# Patient Record
Sex: Male | Born: 1940 | Race: White | Hispanic: No | Marital: Married | Smoking: Former smoker
Health system: Southern US, Community
[De-identification: ages and names within clinical notes are randomized; demographics above are authoritative.]

## PROBLEM LIST (undated history)

## (undated) DIAGNOSIS — I447 Left bundle-branch block, unspecified: Secondary | ICD-10-CM

## (undated) DIAGNOSIS — I10 Essential (primary) hypertension: Secondary | ICD-10-CM

## (undated) DIAGNOSIS — I428 Other cardiomyopathies: Secondary | ICD-10-CM

## (undated) DIAGNOSIS — Z952 Presence of prosthetic heart valve: Secondary | ICD-10-CM

## (undated) DIAGNOSIS — J449 Chronic obstructive pulmonary disease, unspecified: Secondary | ICD-10-CM

## (undated) DIAGNOSIS — D62 Acute posthemorrhagic anemia: Secondary | ICD-10-CM

## (undated) DIAGNOSIS — I2510 Atherosclerotic heart disease of native coronary artery without angina pectoris: Secondary | ICD-10-CM

## (undated) DIAGNOSIS — I509 Heart failure, unspecified: Secondary | ICD-10-CM

## (undated) HISTORY — PX: CARDIAC VALVE REPLACEMENT: SHX585

## (undated) HISTORY — PX: AORTIC VALVE REPLACEMENT: SHX41

## (undated) HISTORY — DX: Atherosclerotic heart disease of native coronary artery without angina pectoris: I25.10

## (undated) HISTORY — DX: Other cardiomyopathies: I42.8

## (undated) HISTORY — DX: Left bundle-branch block, unspecified: I44.7

## (undated) HISTORY — PX: CORONARY ARTERY BYPASS GRAFT: SHX141

## (undated) HISTORY — DX: Presence of prosthetic heart valve: Z95.2

---

## 2005-06-23 ENCOUNTER — Observation Stay (HOSPITAL_COMMUNITY): Admission: AD | Admit: 2005-06-23 | Discharge: 2005-06-24 | Payer: Self-pay | Admitting: Cardiology

## 2005-06-25 ENCOUNTER — Ambulatory Visit: Payer: Self-pay | Admitting: Internal Medicine

## 2005-06-28 ENCOUNTER — Inpatient Hospital Stay (HOSPITAL_COMMUNITY): Admission: RE | Admit: 2005-06-28 | Discharge: 2005-07-06 | Payer: Self-pay | Admitting: *Deleted

## 2006-09-29 ENCOUNTER — Inpatient Hospital Stay: Payer: Self-pay | Admitting: General Surgery

## 2006-10-03 ENCOUNTER — Emergency Department: Payer: Self-pay | Admitting: Unknown Physician Specialty

## 2008-01-19 ENCOUNTER — Encounter: Payer: Self-pay | Admitting: Internal Medicine

## 2008-02-01 ENCOUNTER — Inpatient Hospital Stay (HOSPITAL_COMMUNITY): Admission: AD | Admit: 2008-02-01 | Discharge: 2008-02-04 | Payer: Self-pay | Admitting: Cardiology

## 2008-02-02 ENCOUNTER — Encounter: Payer: Self-pay | Admitting: Cardiovascular Disease

## 2008-02-05 ENCOUNTER — Ambulatory Visit: Payer: Self-pay | Admitting: Surgery

## 2008-02-07 ENCOUNTER — Ambulatory Visit (HOSPITAL_COMMUNITY): Admission: RE | Admit: 2008-02-07 | Discharge: 2008-02-07 | Payer: Self-pay | Admitting: Surgery

## 2008-02-07 ENCOUNTER — Encounter: Payer: Self-pay | Admitting: Surgery

## 2008-02-08 ENCOUNTER — Inpatient Hospital Stay (HOSPITAL_COMMUNITY): Admission: RE | Admit: 2008-02-08 | Discharge: 2008-02-13 | Payer: Self-pay | Admitting: Surgery

## 2008-02-08 ENCOUNTER — Encounter: Payer: Self-pay | Admitting: Surgery

## 2008-02-10 ENCOUNTER — Encounter: Payer: Self-pay | Admitting: Internal Medicine

## 2008-03-05 ENCOUNTER — Encounter: Admission: RE | Admit: 2008-03-05 | Discharge: 2008-03-05 | Payer: Self-pay | Admitting: Surgery

## 2008-03-05 ENCOUNTER — Ambulatory Visit: Payer: Self-pay | Admitting: Surgery

## 2008-06-07 ENCOUNTER — Ambulatory Visit: Payer: Self-pay | Admitting: Surgery

## 2009-06-28 IMAGING — CT CT ANGIO CHEST
2 of 6 series · 19 of 46 positions shown · IV contrast (APPLIED)
Comparison: None

CLINICAL DATA: Aortic insufficiency.  Evaluate ascending aortic
aneurysm.

CT ANGIOGRAPHY CHEST, CT chest without infusion.
TECHNIQUE: Multidetector CT imaging of the chest using the
standard protocol during bolus administration of intravenous
contrast. Multiplanar reconstructed images including MIPs were
obtained and reviewed to evaluate the vascular anatomy. Precontrast
CT scan of the chest was performed as well.
Contrast: 100 ml [id number].

[Series 5: dissection 2.0 st · axial · 0.80mm/px · z∈[-328,-40]mm · 16 of 160 slices shown]
[im 8/160  lung]
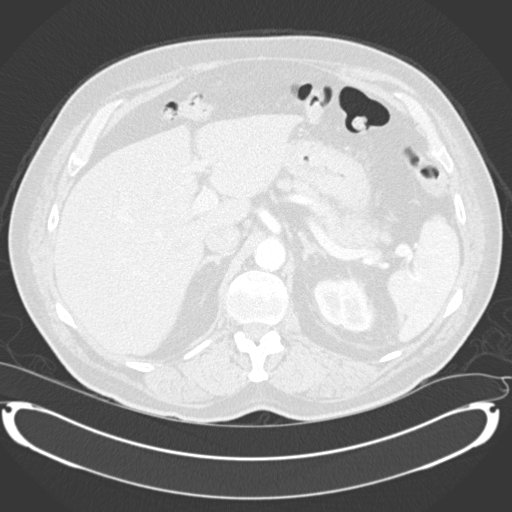
[im 15/160  soft-tissue]
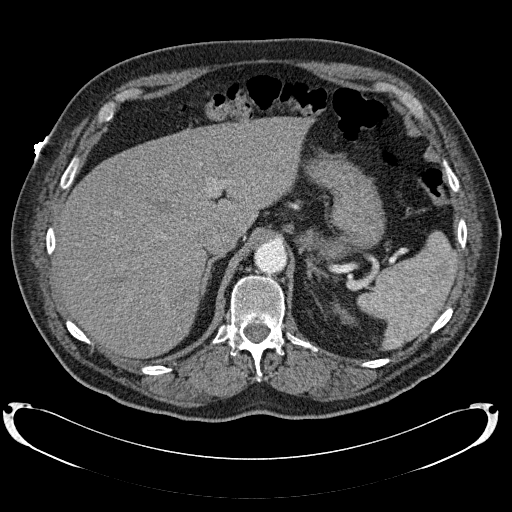
[im 29/160  lung]
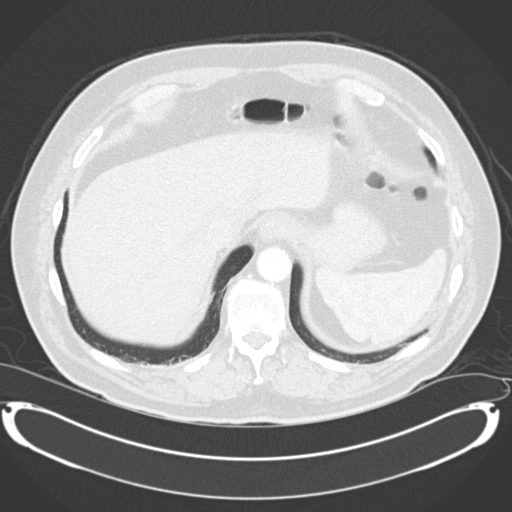
[im 37/160  soft-tissue]
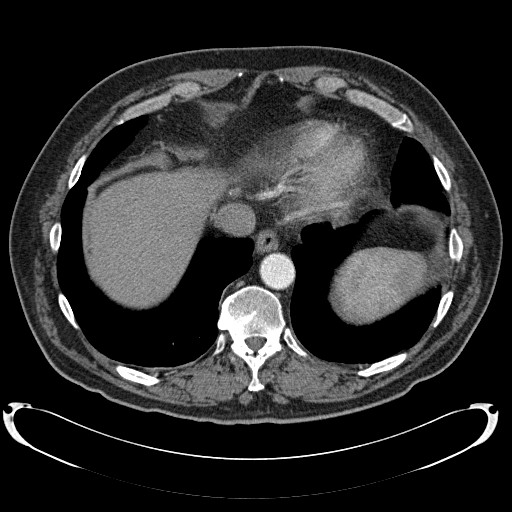
[im 44/160  lung]
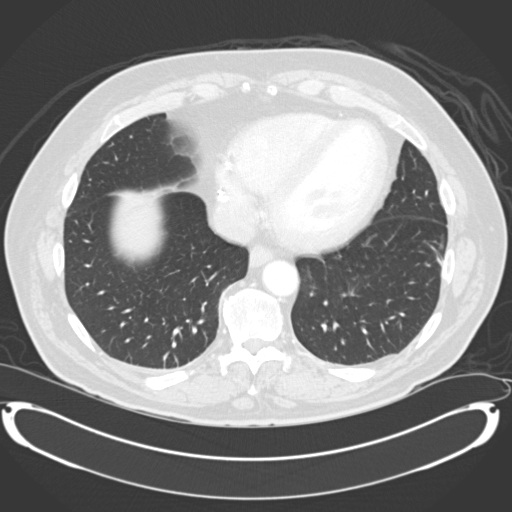
[im 58/160  soft-tissue]
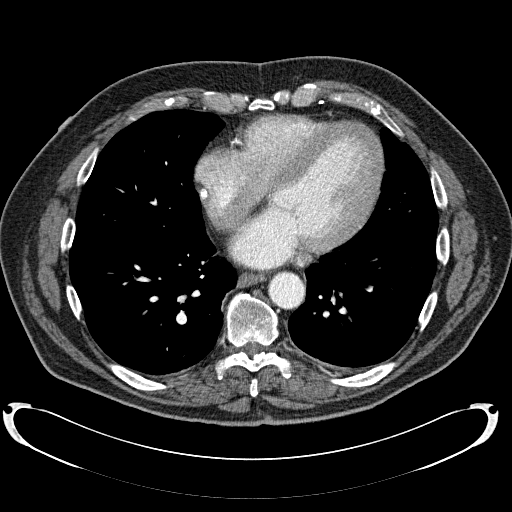
[im 66/160  lung]
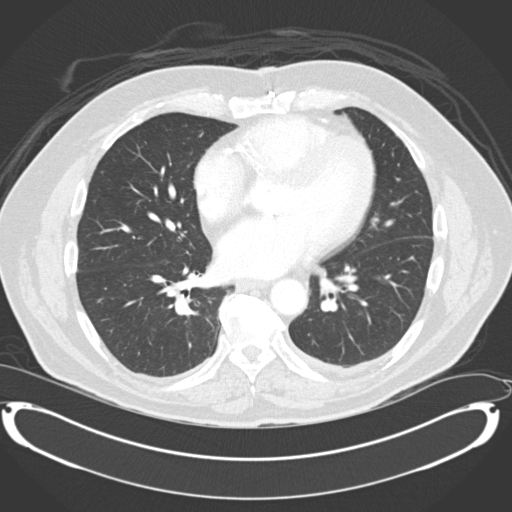
[im 73/160  soft-tissue]
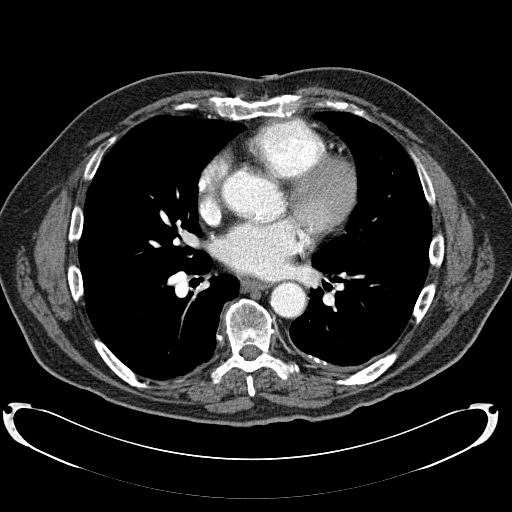
[im 87/160  lung]
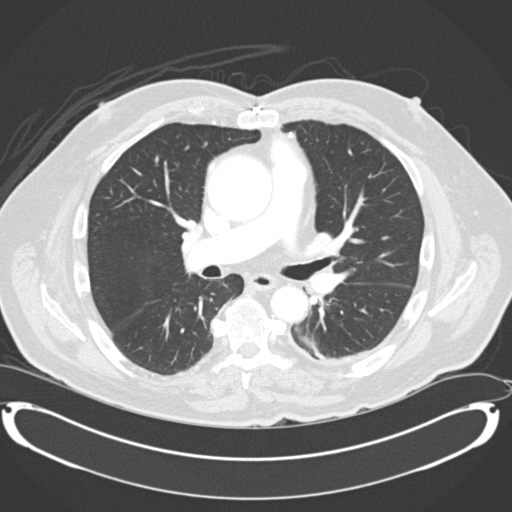
[im 94/160  soft-tissue]
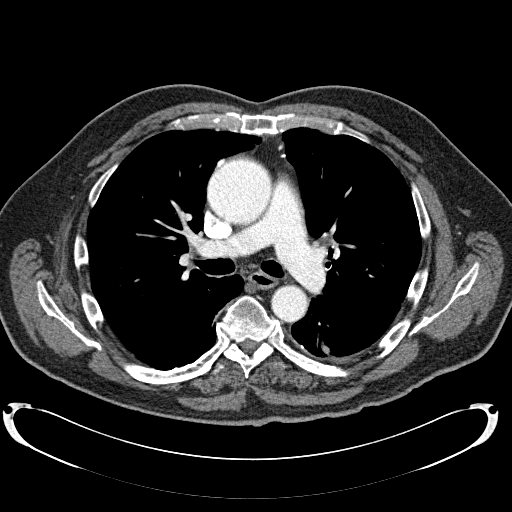
[im 102/160  lung]
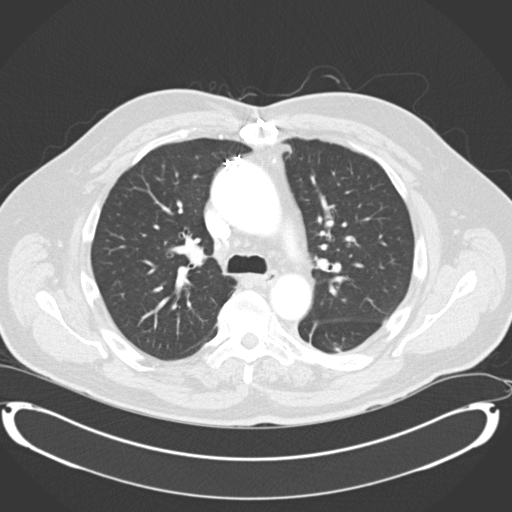
[im 116/160  soft-tissue]
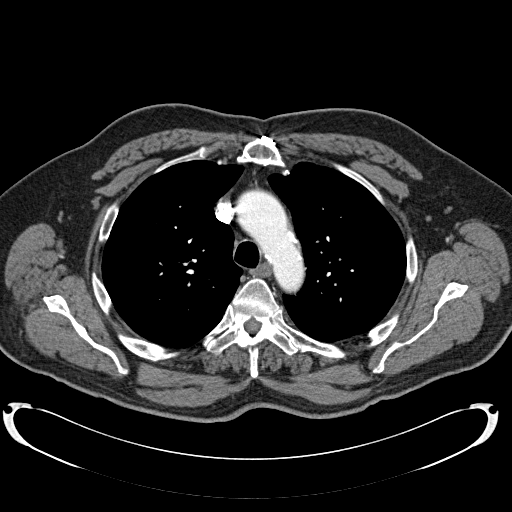
[im 123/160  lung]
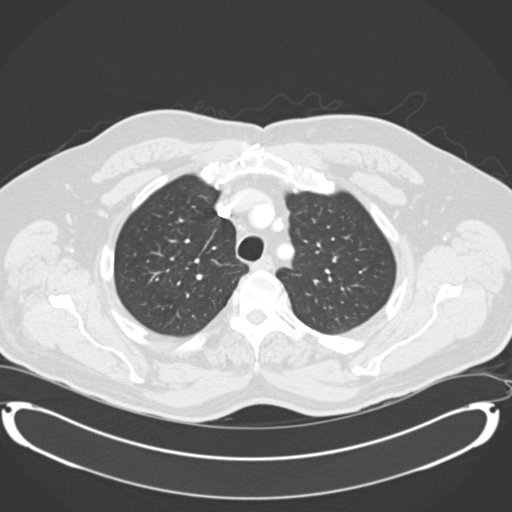
[im 131/160  soft-tissue]
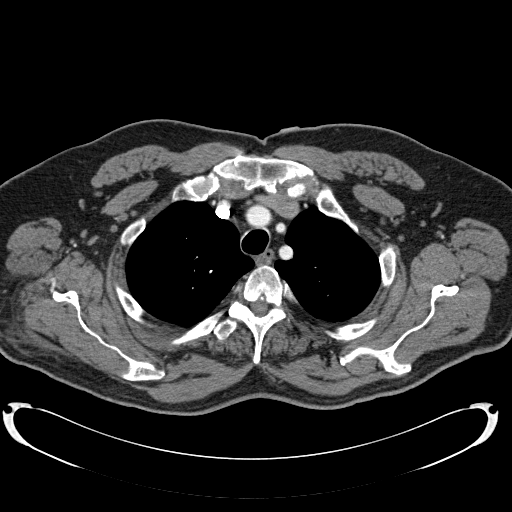
[im 145/160  lung]
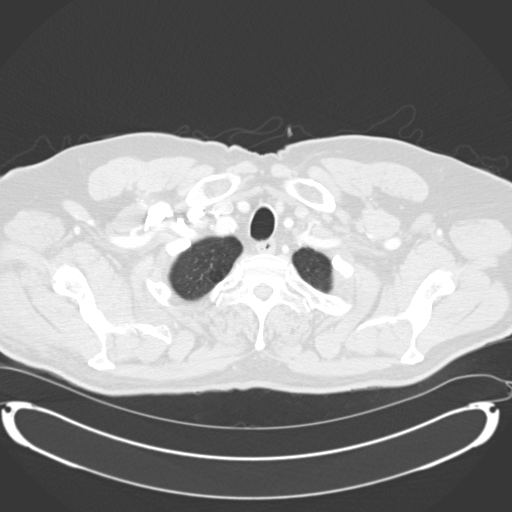
[im 152/160  soft-tissue]
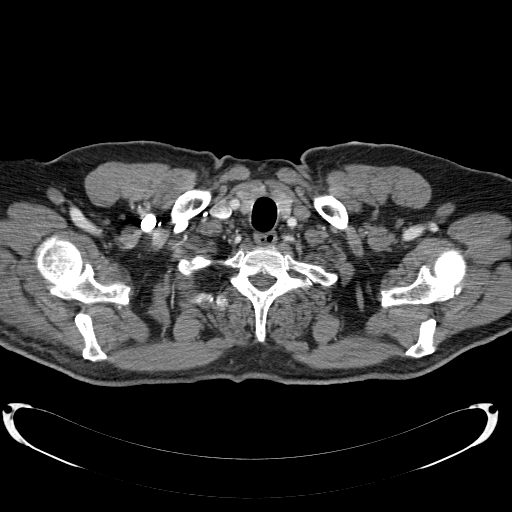

[Series 602: cor chest · coronal · 0.80mm/px · 3 of 133 slices shown]
[im 34/133  soft-tissue]
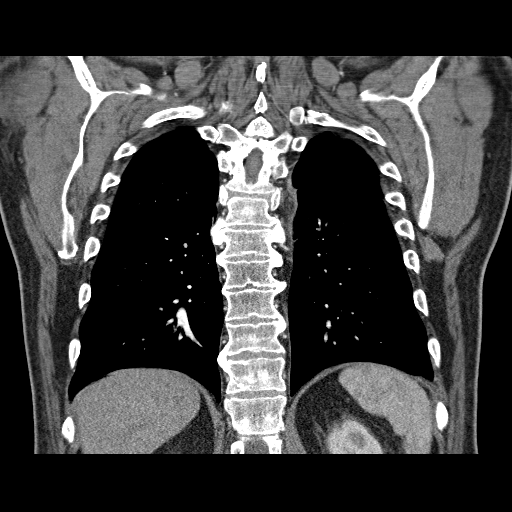
[im 67/133  soft-tissue]
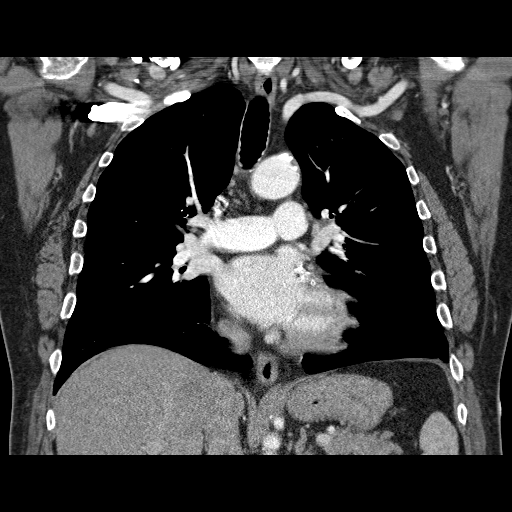
[im 100/133  soft-tissue]
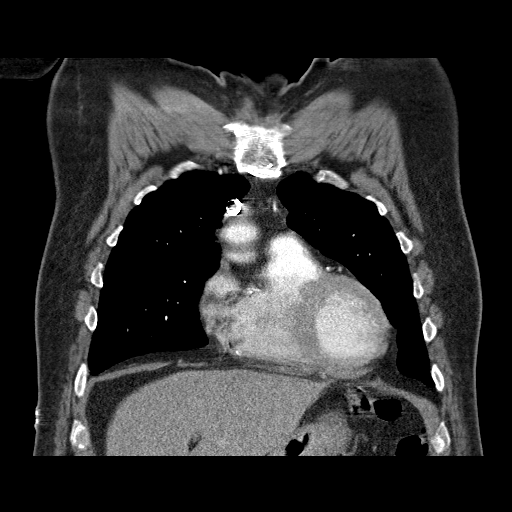

[19 of 46 positions shown; findings below may reference images not displayed]

FINDINGS: No acute aortic abnormality is present.  Median
sternotomy / CABG.  Aneurysm of the ascending aorta is present,
measuring 5.5 cm on image number 73.  No pericardial or pleural
effusion is present. Dense aortic annular calcification is present.
Pulmonary arteries appear within normal limits.  Descending
thoracic aorta is normal in caliber.  Mild aortic arch
atherosclerosis.  Lungs demonstrate mild apical centrilobular
emphysema.  No airspace disease.  Subsegmental atelectasis versus
scarring is present in the left lower lobe.  Calcified granuloma
present on image number 79. No mediastinal, hilar, or axillary
lymphadenopathy.
IMPRESSION: 5.5 cm ascending aortic aneurysm.
CABG.  Aortic annular calcification.  Old granulomatous disease.
No acute aortic abnormality.

## 2009-07-03 IMAGING — CR DG CHEST 1V PORT
1 series · 1 of 1 positions shown · non-contrast
Comparison: 02/07/2008

CLINICAL DATA: CABG.

PORTABLE CHEST - 1 VIEW

[view not recorded]
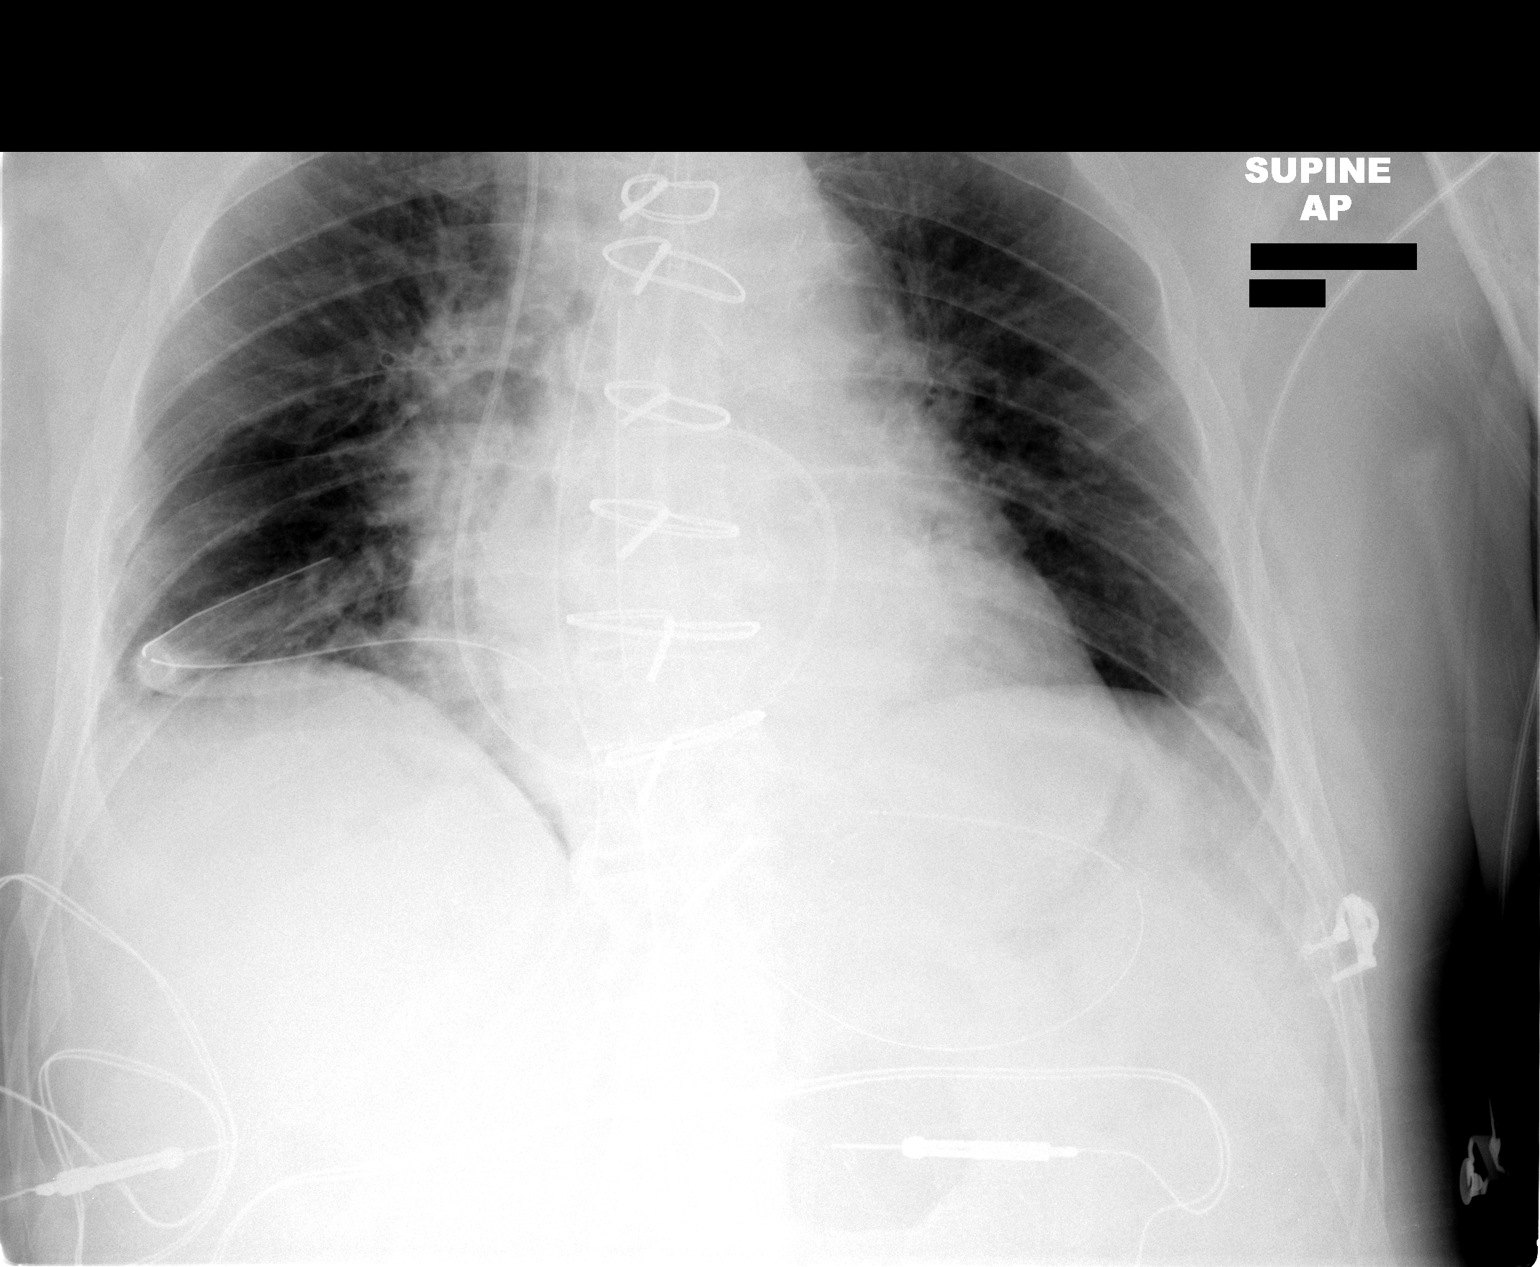

[1 of 1 positions shown; findings below may reference images not displayed]

FINDINGS: The patient is status post CABG.  Right chest tube is in
place without pneumothorax.  Endotracheal tube is 7 cm above the
carina.  Swan-Ganz catheter is in the main right pulmonary artery.
NG tube coils in the stomach.

There is cardiomegaly with vascular congestion and bibasilar
atelectasis.
IMPRESSION: Postoperative changes with support devices as described above.  No
pneumothorax.

Cardiomegaly, vascular congestion, bibasilar atelectasis.

## 2010-08-25 NOTE — Consult Note (Signed)
NAMEJAYDIAN, [name] NO.:  [id number]   MEDICAL RECORD NO.:  [id number]          PATIENT TYPE:  INP   LOCATION:  3706                         FACILITY:  [facility]   PHYSICIAN:  Evelene Croon, M.D.     DATE OF BIRTH:  08-10-1940   DATE OF CONSULTATION:  02/03/2008  DATE OF DISCHARGE:                                 CONSULTATION   REFERRING PHYSICIAN:  Vonna Kotyk R. 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   CLINICAL HISTORY:  I was asked by Dr. Jacinto Halim to evaluate this gentleman  for consideration of surgical treatment.  He is a complicated 70-year-  old white male with history of coronary artery disease who had an  anterior MI in 1994 and then subsequently had a coronary artery bypass  x2 with a left internal mammary graft to LAD and a saphenous vein graft  to the posterolateral branch of the right coronary artery as well as  aortic valve replacement with a St. Jude mechanical valve in 2002 by Dr.  Romona Curls.  He subsequently had a stent placed in the proximal left  circumflex about 2 years ago by Dr. Jacinto Halim.  He did well until the past  few weeks when he presented with shortness of breath with minimal  activity and fatigue, which he never had before.  He has had no chest  pain or peripheral edema.  He underwent a stress Cardiolite exam, which  showed inferior and inferior septal ischemia.  Cardiac catheterization  showed 30% proximal LAD stenosis with a patent left internal mammary  graft.  There was patent left circumflex stent.  There was 70% mid right  coronary stenosis with moderate sized posterior descending and second  posterolateral branch.  There is a patent saphenous vein graft to the  first posterolateral branch, which was subtotally  occluded.  There was  2+ aortic insufficiency through the prosthetic valve with markedly  dilated aortic root.  A transesophageal echocardiogram showed at least  moderate aortic insufficiency through the valve and in a perivalvular  location.  There is moderate left ventricular dysfunction with ejection  fraction of 35%-40%.  There is mild right ventricular dysfunction.  There is mild mitral regurgitation and tricuspid regurgitation.  There  is an ascending aortic aneurysm.  A CT angiogram of the chest today  showed maximal diameter of the ascending aneurysm was about 5.5 cm.  This appeared to extend from the level of the aortic sinuses up to the  proximal arch.   REVIEW OF SYSTEMS:  His review of systems is as follows.  GENERAL:  He  denies any fever or chills.  He has had no recent weight changes.  He  has had recent fatigue.  EYES:  Negative.  ENT:  Negative.  He is  edentulous.  ENDOCRINE:  He denies diabetes and hypothyroidism.  CARDIOVASCULAR:  He denies any chest pain or pressure.  He has had  no  PND or orthopnea.  He has had exertional dyspnea.  He has a history of  atrial flutter in the past treated with ablation.  RESPIRATORY:  He  denies cough and sputum production.  GI:  He has had no nausea or  vomiting.  He denies melena and bright red blood per rectum.  He did  have a history of melena after his previous cardiac surgery and reported  having an extensive workup that never showed the site of bleeding.  GU:  Denies dysuria and hematuria.  MUSCULOSKELETAL:  Denies arthralgias and  myalgias.  NEUROLOGICAL:  Denies any focal weakness or numbness.  Denies  dizziness and syncope.  He has never had TIA or stroke.  ALLERGIES:  None.  PSYCHIATRIC:  Negative.  HEMATOLOGICAL:  Negative.   His past medical history is significant for myocardial infarction in  1994 as mentioned above.  He has a history of coronary artery bypass x2  and aortic valve replacements as mentioned above at  [facility].  He said he  was returned to the operating room postoperatively for bleeding and then  a week or so postoperatively developed melena and required  hospitalization and about total of 30 units of transfusion for  persistent GI bleeding with no source ever seen.  He has had no further  problems with GI bleeding since then.  He has a history of dyslipidemia.  He has a history of hypertension.  He has a history of paroxysmal atrial  flutter, treated with radiofrequency ablation.  He is status post carpal  tunnel surgery and status post appendectomy.   His medications prior to admission were carvedilol 25 mg b.i.d.,  enalapril/hydrochlorothiazide 5/12.5 daily, Coumadin 10 mg daily,  Lipitor 10 mg daily, colchicine 0.6 mg q.6 h., allopurinol 300 mg daily,  folic acid 800 mcg daily, vitamin B12 1000 mcg daily, iron 28 mg daily,  and fish oil 1000 mg daily.   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  He is a remote smoker.  Drinks about an ounce of alcohol per day.   FAMILY HISTORY:  His father died of myocardial infarction at age 56.   PHYSICAL EXAMINATION:  GENERAL:  He is a well-developed white male in no  distress.  VITAL SIGNS:  Blood pressure is 150/77 with pulse of 56 and regular.  Respiratory rate is 18 and nonlabored.  HEENT:  Normocephalic and atraumatic.  Pupils are equal and reactive to  light and accommodation.  Extraocular muscles are intact.  His throat is  clear.  He is wearing dentures.  NECK:  Normal carotid pulses bilaterally.  There is a transmitted murmur  to both sides of his neck.  There is no adenopathy or thyromegaly.  There is no JVD.  There is a well-healed sternotomy incision.  Sternum  feels stable.  CARDIAC:  Regular rate and rhythm with a harsh 3/6 systolic murmur over  the aorta.  I cannot hear a diastolic murmur.  LUNGS:  Rales in the basis.  ABDOMEN:  Active bowel sounds.  His abdomen is soft and nontender.  No   palpable masses or organomegaly.  EXTREMITIES:  No peripheral edema.  Pedal pulses are palpable  bilaterally.  SKIN:  Warm and dry.  NEUROLOGIC:  Alert and oriented x3.  Motor and sensory exam is grossly  normal.   Laboratory examination today shows a hemoglobin of 10.8 with hematocrit  of 32.7, platelet count 198, white blood cell count 9.4.  His  coagulation profile is normal with normal electrolytes.  BUN is 11 and  his creatinine 1.14.  Albumin is 3.3.  Liver function profile is within  normal limits.   IMPRESSION:  Mr. Borquez has at least moderate prosthetic valve aortic  insufficiency with a periprosthetic leak and moderate left ventricular  dysfunction as well as an enlarging ascending aneurysm.  His aorta was  about 4-cm by TEE performed in 2007.  The current dimension by CT scan  is 5.5 cm.  His left ventricular dysfunction has been there for a few  years and his ventricle actually looked worse to me on TEE in 2007  compared to currently.  He also has evidence of inferior and inferior  septal ischemia by Cardiolite stress test with an 87% lesion in the mid  right coronary artery.  I agree that the best treatment for him is  replacement of his current aortic valve with and ascending aneurysm with  a mechanical valve conduit with reimplantation of his coronary arteries  as well as bypass of the distal right coronary artery.  The bypass to  his first posterolateral branch which is patent at this time would have  to be replaced or lengthened so that it could be reanastomose to the new  conduit.  This would be a complex long and risky operation for him, but  I think without surgical therapy, he will likely develop progressive  left ventricular dysfunction as well as an enlarging aneurysm with  rupture or dissection.  I think his risk will be reduced if this is done  electively versus emergently.  I discussed the operative procedure with  him as well as the alternative using a  tissue valve conduit for  replacement.  Given his age of 44 years and the fact that this is a redo  surgery, already I would not recommend using a tissue valve in his case.  I discussed the benefits and risks of surgery including but not limited  to bleeding, blood transfusion, infection, stroke, myocardial  infarction, graft failure, heart block requiring permanent pacemaker,  malfunction of the mechanical valve, heart failure, and death.  He  understands all this and agrees to proceed.  I would not be able to  perform surgery until at least some time late next week, until then we  will  let him know on Monday what day I could do it.  He is very concerned  about the expense of stay in the hospital until surgery and would like  to go home with Lovenox therapy at home until surgery is performed.  I  think this would be perfectly acceptable.      Evelene Croon, M.D.  Electronically Signed     BB/MEDQ  D:  02/03/2008  T:  02/04/2008  Job:  [id number]   cc:   Cristy Hilts. Jacinto Halim, MD

## 2010-08-25 NOTE — Discharge Summary (Signed)
[name], [name] NO.:  [id number]   MEDICAL RECORD NO.:  [id number]         PATIENT TYPE:  CINP   LOCATION:                               FACILITY:  [facility]   PHYSICIAN:  Evelene Croon, M.D.     DATE OF BIRTH:  20-Sep-1940   DATE OF ADMISSION:  02/08/2008  DATE OF DISCHARGE:                               DISCHARGE SUMMARY   FINAL DIAGNOSES:  1. Moderate prosthetic valve aortic insufficiency with periprosthetic      leak and 5.5-cm ascending aortic aneurysm.  2. Significant single-vessel coronary artery disease status post      coronary artery bypass graft surgery and aortic valve replacement      in 2002.   IN-HOSPITAL DIAGNOSES:  1. Volume overload postoperatively.  2. Acute blood loss anemia postoperatively.   SECONDARY DIAGNOSES:  1. History of myocardial infarction in 1994, status post coronary      artery bypass grafting x2 and aortic valve replacement with a St.      Jude mechanical valve in 2002 by Dr. Romona Curls at [facility].  2. Dyslipidemia.  3. Hypertension.  4. History of paroxysmal atrial flutter, she has radiofrequency      ablation.  5. Status post carpal tunnel surgery.  6. Status post appendectomy.  7. Status post gastrointestinal bleed, post coronary bypass grafting      and aortic valve replacement in 2002.   IN-HOSPITAL OPERATIONS AND PROCEDURES:  1. Redo median sternotomy for coronary artery bypass grafting x2 using      a sequential saphenous vein graft to posterior descending posterior      lateral branches of the right coronary artery.  2. Replacement of aortic valve and aortic root with a 23-mm Medtronic      Freestyle aortic root heart valve with reimplantation of coronary      arteries, placement of ascending aortic aneurysm using a 30-mm      Hemashield tube graft using deep hypothermic circulatory arrest.  3. Endoscopic vein harvesting from right leg.   HISTORY AND PHYSICAL AND HOSPITAL COURSE:  The patient is a  70 year old  male with a history of coronary artery disease status post anterior MI  in 1994 and subsequently had coronary artery bypass grafting x2 in the  left internal mammary graft to LAD and saphenous vein graft to first  posterolateral branch of the right coronary artery as well as aortic  valve replacement with St. Jude mechanical valve in 2002 at [place].  The  patient did have difficult time postoperatively and required re-  exploration for bleeding after surgery and was on a ventilator for  several days afterwards.  He subsequently had stent placed in the  proximal left circumflex about 2 years ago by Dr. Jacinto Halim.  He said he has  done well until the past several weeks when he developed shortness of  breath with minimal activity as well as fatigue.  He denied chest pain  or peripheral edema.  The patient underwent stress Cardiolite exam,  which showed inferior and inferior septal ischemia.  Cardiac  catheterization showed 30% proximal LAD stenosis with  a PA and left  internal mammary graft.  There is a PA and left circumflex stent.  There  is a 70% mid-right coronary artery stenosis with moderate-sized  posterior descending and posterolateral branches.  He was noted to have  2+ aortic insufficiency through the prosthetic valve with markedly  dilated aortic root.  Transesophageal echocardiogram showed at least  moderate aortic insufficiency through the valve and perivalvular  location.  The patient had moderate left ventricular dysfunction with  ejection fraction of 35-40%.  CT angiogram done of the chest showed  maximal diameter of the ascending aneurysm was about 5.5 cm just above  sinotubular junction.  This appeared to extend from the level of the  iliac sinuses up to the proximal arch.  Following all these studies, Dr.  Laneta Simmers was consulted.  Dr. Laneta Simmers saw and evaluated the patient.  He  discussed with the patient undergoing surgery.  Dr. Laneta Simmers discussed the  risks and benefits  with the patient.  The patient acknowledges  understanding and agreed to proceed.  Surgery was scheduled for February 08, 2008.   The patient was taken to the operating room on February 08, 2008, where  he underwent redo sternotomy with coronary artery bypass grafting x2  using a sequential saphenous vein graft to posterior descending  posterolateral branches of the right coronary artery, replacement of  aortic valve and aortic root with a 23-mm Medtronic Freestyle aortic  root heart valve with reimplantation of the coronary arteries, placement  of the ascending aortic aneurysm using 30-mm Hemashield tube graft using  deep hypothermic circulatory arrest.  Endoscopic vein harvesting from  right leg was done.  The patient tolerated this procedure well and was  transferred to the intensive care unit in stable condition.  Postoperatively, the patient was noted to be hemodynamically stable.  He  was extubated on the evening of surgery.  Post extubation, the patient  was noted to be alert and oriented x4, neuro intact.  The patient's  postoperative course was pretty much unremarkable.  On postop day #1,  chest x-ray obtained was noted to be stable with no significant  effusions noted.  The patient had minimal drainage from chest tubes and  chest tubes were discontinued in normal fashion.  Repeat followup chest  x-ray remained clear.  The patient was eventually able to be weaned off  oxygen with O2 sats greater than 90% on room air.  Postoperatively, the  patient was noted to be in normal sinus rhythm.  All drips were weaned  and discontinued.  Heart rate and blood pressure remained stable.  He  remained in normal sinus rhythm during his postoperative course.  He was  restarted on his ACE inhibitor.  The patient was restarted on his  Plavix.  The patient was started on Plavix postoperatively for the stent  that was patent in the circumflex.  He did not need to be restarted on  Coumadin.  He had  undergone a tissue valve implantation.  The patient  remained in the intensive care unit on postop day #2.  He was up  ambulating with assistance.  He was felt to be stable and transferred  out to 2000 on postop day #2.  The patient continued to progress well.  He did have some mild volume overload and had been started on diuretics.  Daily weights obtained and he was back near his baseline weight prior to  discharge home.  The patient also had some mild acute blood loss anemia.  He was asymptomatic and had remained stable.  No blood transfusions were  required.  The patient continued to ambulate well with cardiac rehab.  He was tolerating diet well.  No nausea or vomiting noted.  All  incisions were noted to be clean, dry and intact and healing well.   The patient is tentatively ready for discharge home in the a.m. of  February 13, 2008, on postop day #5.  Currently, he is in normal sinus  rhythm and blood pressure is stable.  Labs done on postop day #3 showed  a white count of 12.7, hemoglobin 9.4, hematocrit 28.6, and platelet  count 162.  Sodium of 138, potassium 4.8, chloride of 101, bicarbonate  30, BUN of 13, creatinine of 1.17, and glucose of 116.   FOLLOWUP APPOINTMENTS:  Followup appointment has been arranged with Dr.  Laneta Simmers for March 05, 2008, at 11 o'clock a.m.  The patient will need  to obtain PA and lateral chest x-ray 30 minutes prior to this  appointment.  The patient will need to follow up with Dr. Jacinto Halim in 2  weeks.  He need to contact Dr. Jacinto Halim' office to make these arrangements.   ACTIVITY:  The patient is instructed no driving until released to do so.  No lifting over 10 pounds.  He is told to ambulate 3-4 times a day and  progress as tolerated and continue his breathing exercises.   INCISIONAL CARE:  The patient is told to shower, washing his incisions  using soap and water.  He is to contact the office if he develops any  drainage or opening from any of his  incision sites.   DIET:  The patient is educated on diet to be low fat, low salt.   DISCHARGE MEDICATIONS:  1. Lipitor 10 mg daily.  2. Colchicine 0.6 mg q.6 h.  3. Allopurinol 300 mg daily.  4. Folic acid 800 mcg daily.  5. Vitamin B12 1000 mcg daily.  6. Iron 28 mg daily.  7. Valium 2 mg p.r.n.  8. Aspirin 325 mg daily.  9. Plavix 75 mg daily.  10.Lasix 40 mg daily x5 days.  11.Potassium chloride 20 mEq daily x5 days.  12.Enalapril 5 mg daily.  13.Oxycodone 5 mg 1-2 tabs q.4-6 h. P.r.n.      Theda Belfast, [place]      Evelene Croon, M.D.  Electronically Signed    KMD/MEDQ  D:  02/12/2008  T:  02/13/2008  Job:  [id number]   cc:   Evelene Croon, M.D.  Cristy Hilts. Jacinto Halim, MD

## 2010-08-25 NOTE — Cardiovascular Report (Signed)
NAME:  [name], FORNWALT NO.:  [id number]   MEDICAL RECORD NO.:  [id number]          PATIENT TYPE:  INP   LOCATION:  3706                         FACILITY:  [facility]   PHYSICIAN:  Nanetta Batty, M.D.   DATE OF BIRTH:  May 11, 1940   DATE OF PROCEDURE:  02/02/2008  DATE OF DISCHARGE:                            CARDIAC CATHETERIZATION   INDICATIONS:  [name] is a 70 year old gentleman with history of CAD  status post bypass grafting x2 in 2001 with a St. Jude AVR.  He had  circumflex stenting by Dr. Jacinto Halim 2 years ago.  His other problems  include hypertension and dyslipidemia.  He has been complaining of  shortness of breath and chest pain.  A Myoview stress test showed  inferior/inferoseptal ischemia.  He presents now for diagnostic coronary  arteriography and supravalvular aortography to define his anatomy and  aortic valve.   DESCRIPTION OF PROCEDURE:  The patient was brought to the second floor  [facility] in a postabsorptive state.  He was  premedicated with p.o. Valium combined with IV Versed and fentanyl.  His  right groin was prepped and shaved in the usual sterile fashion.  Xylocaine 1% was used for local anesthesia.  A 6-French sheath was  inserted into the right femoral artery using standard Seldinger  technique.  A 6-French right and left Judkins diagnostic catheters along  with a 6-French pigtail catheter were used for selective coronary  angiography, selective vein graft and IMA angiography, and supravalvular  aortography.  Left ventriculography was not performed because of the St.  Jude valve.  ICD dye was used for the entirety of the case.  Retrograde  aortic pressures were monitored during the case.   HEMODYNAMICS:  1. Aortic systolic pressure 149, diastolic pressure 65.   SELECTIVE CORONARY ANGIOGRAPHY:  1. Left main normal.  2. LAD; the LAD only had a 30% proximal stenosis.  There was      competitive flow distally.  3. Left  circumflex; the circumflex stent was widely patent.  4. Ramus branch; moderate-to-large vessel which was widely patent.  5. Right coronary artery; large dominant vessel with a 70% hypodense      tubular lesion in the midportion.  The PL1 was occluded.  The PDA      and PL2 were widely patent.  6. Vein graft to PLA-1, widely patent.  7. LIMA to LAD, widely patent.   SUPRAVALVULAR AORTOGRAPHY:  Supravalvular aortogram was performed using  20 mL of Visipaque dye at 20 mL per second.  There was 2+ angiographic  aortic insufficiency.  The supravalvular ascending aorta appeared  severely dilated.   IMPRESSION:  [name] has widely patent circumflex stent, patent  grafts, and jeopardized PDA and PLA-2.  He has 2+ angiographic AI with a  severely dilated aortic root.   The sheath was removed and pressure was held on the groin to achieve  hemostasis.  The patient left the lab in stable condition.   PLAN:  To perform transesophageal echo this afternoon after which he  will be re-heparinized given his aortic valve.  I am also going to order  a CT angiogram of his chest to size his aortic root.  He may also  benefit from PCI stenting of his mid dominant RCA, which jeopardizes his  PDA and PLA-2 which were not bypassed.      Nanetta Batty, M.D.  Electronically Signed     JB/MEDQ  D:  02/02/2008  T:  02/02/2008  Job:  [id number]   cc:   Second Floor [facility]  [facility]  [place]

## 2010-08-25 NOTE — Assessment & Plan Note (Signed)
OFFICE VISIT   [name], [name]  DOB:  07-Sep-1940                                        March 05, 2008  CHART #:  [id number]   The patient returns today for followup status post redo sternotomy,  coronary artery bypass x2 using saphenous vein grafts, and replacement  of a 19-mm St. Jude mechanical aortic valve with a 23-mm Medtronic  Freestyle Aortic Root with reimplantation in the coronary arteries.  We  also replaced his ascending aortic aneurysm with a 30-mm Hemashield tube  graft under hypothermic circulatory arrest.  He had an uncomplicated  postoperative course.  Since discharge, he has been feeling fairly well  overall.  He still has mild shortness of breath with exertion and chest  wall soreness.  He is using a treadmill at home.  He is a remote smoker,  but quit in 1994.   PHYSICAL EXAMINATION:  VITAL SIGNS:  Today, his blood pressure is  121/70, his pulse is 82 and regular, and respiratory rate is 18 and  unlabored.  Oxygen saturation on room air is 98%.  GENERAL:  He looks well.  CARDIAC:  Regular rate and rhythm with a grade 1/6 systolic flow murmur  across his aortic root prosthesis.  There is no diastolic murmur.  LUNGS:  Clear.  The chest incision is healing well and sternum is  stable.  EXTREMITIES:  His right leg incision is healing well.  There is no  peripheral edema.   Followup chest x-ray today shows improved aeration with minimal basilar  atelectasis.  There is no significant effusion.   MEDICATIONS:  Lipitor 40 mg daily, Plavix 75 mg daily, enalapril 5 mg  daily, carvedilol 12.5 mg b.i.d., allopurinol 300 mg daily, colchicine  0.6 mg b.i.d., aspirin 81 mg daily, iron daily, folic acid 800 mg daily,  and vitamin B12 1000 mg daily.   IMPRESSION:  Overall, the patient is recovering well following a very  complicated surgery.  I encouraged him to continue walking as much as  possible.  I told him he can return to driving a  car, but should refrain  lifting anything heavier than 10 pounds for a total of 3 months from  date of surgery.  He will continue to follow up with Dr. Jacinto Halim and will  contact me if he develops any problems with his incisions.   Evelene Croon, M.D.  Electronically Signed   BB/MEDQ  D:  03/05/2008  T:  03/05/2008  Job:  [id number]   cc:   Cristy Hilts. Jacinto Halim, MD

## 2010-08-25 NOTE — Discharge Summary (Signed)
[place], HOLNESS NO.:  [id number]   MEDICAL RECORD NO.:  [id number]          PATIENT TYPE:  INP   LOCATION:  3706                         FACILITY:  [facility]   PHYSICIAN:  Cristy Hilts. Jacinto Halim, MD       DATE OF BIRTH:  July 26, 1940   DATE OF ADMISSION:  02/01/2008  DATE OF DISCHARGE:  02/04/2008                               DISCHARGE SUMMARY   DISCHARGE DIAGNOSES:  1. Dyspnea on exertion.  2. Mild to moderate decrease of left ventricular function, ejection      fraction 35-40%.  3. Abnormal nuclear study.  4. Coronary artery disease with history of bypass grafting now with      stenosis posterior descending artery and postero-lateral artery.  5. Ascending aortic aneurysm with root of 5.15 cm.  6. 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  7. Mild mitral regurgitation, mild tricuspid regurgitation.  Aortic      insufficiency is 2+.  8. Hypertension controlled.  9. Chronic Coumadin therapy.  10.History of the aortic valve replacement with mechanical St. Jude      valve in 2000 with his bypass at that time.  11.History of circumflex stent placed in 2007.  12.Gout, currently controlled.  13.Sinus bradycardia in the hospital with decrease of his Coreg dose.  14.History of a flutter oblation in March 2007.   DISCHARGE MEDICATIONS.:  1. Coreg was decreased to half a tablet which was 25 mg twice a day,      so now he will be on 12.5 twice a day.  2. Enalapril hydrochlorothiazide 5/12.5 daily.  3. Do not take Coumadin.  4. Lipitor 10 mg daily.  5. Colchicine 0.6 mg every 6 hours as needed.  6. Allopurinol 300 mg daily.  7. Folic acid 800 mcg daily.  8. B12 1000 mcg orally daily.  9. Iron 28 mg daily.  10.Fish oil, hold.  11.Continue Omega 3 daily.  12.Lovenox 100 mg subcutaneously twice a day every 12 hours until Dr.      Sharee Pimple office tells to stop 12:00 a.m. and 12:00 p.m.   DISCHARGE INSTRUCTIONS:  1. No work.  2. Low-sodium heart-healthy  diet.  3. Wash cath site with soap and water.  Call if any bleeding, swelling      or drainage.  Increase activity slowly.  May shower or bathe but no      lifting, no strenuous activity.  4. Follow up with Dr. Laneta Simmers.  His office will call you Monday February 05, 2008 for instructions on surgery.  5. Please let Dr. Verl Dicker office nurse know the day of surgery.   PROCEDURES:  1. February 02, 2008 combined left heart cath with graft visualization      by Dr. Nanetta Batty.  2. February 02, 2008.  TEE by Dr. Dossie Arbour.   HISTORY OF PRESENT ILLNESS:  A 70 year old white married male with known  coronary disease coronary disease and aortic valve replacement both  including bypass grafting in 2000 presented in the office in September  for follow up and had increasing shortness of  breath with exertion over  the last several months.  He had been trying to lose weight, lost about  12 pounds since his last office visit and he denied any chest pain.  Dr.  Jacinto Halim ordered a stress Myoview to rule out ischemia and 2-D echo was  done.  The stress Myoview inferior septal ischemia.  Dr. Jacinto Halim was  worried about aortic insufficiency with his mechanical aortic valve.   The patient's Coumadin was held.  He was brought in on February 01, 2008  for evaluation and heparin Coumadin crossover.  He was admitted, placed  on IV heparin and plans were for a cath and echo the next day which he  did undergo with results as previously stated in problem list.  Please  see dictated reports as well as Dr. Laneta Simmers was consulted and with his  aortic valve issues, as well as decision whether to stent the RCA lesion  versus re-bypass.   Dr. Laneta Simmers saw the patient February 03, 2008 discussed options with the  patient and it was decided by both that he would need aortic root  replacement, aortic valve replacement, and reimplantation of the  coronary artery bypass to the distal RCA and PCI.  The patient was  agreeable to  proceed with surgery.  He preferred to go home on Lovenox  instead of waiting for surgery as it would be later in the week of  October 26.  The patient had given himself Lovenox in the past, could  afford the co-pay so therefore he was discharged with Lovenox after  showing nurse how to inject it, and plans are he will return per Dr.  Sharee Pimple instructions for surgery.   LABORATORY DATA:  On admission hemoglobin 9.8, hematocrit 30.2, WBC 6.2,  platelets 216,000, MCV 88.  At discharge hemoglobin 9.6, hematocrit  29.4, WBC 9.7.   Chemistry remained stable.  Sodium 138, potassium 3.9, chloride 105, CO2  25, BUN 11, creatinine 1.14, glucose 103.  He did have 1 day of  hypokalemia and was replaced with potassium.  Coags on admission pro  time 18.2, INR 1.4, PTT 61 placed on heparin and at discharge pro time  16.1, INR 1.3, heparin 0.36.  This was just discontinued the heparin and  Lovenox started.   LFTs were normal.  AST 28, ALT 18, alkaline phos 63, total bili 0.8,  albumin 3.3.   Calcium 8.6.   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   TEE, mildly dilated LV with moderately depressed LV systolic function,  EF estimated to be 35%.  RV is normal in size with mildly depressed  systolic function, mildly dilated LA an RA.  Prosthetic aortic valve,  St. Jude, moderate AI with several images concerning for perivalvular  leak, best seen in images 26-29.5.  Mild MR and TR and dilated aortic  root measuring 5.15 cm with ascending aortic aneurysm.   The patient was discharged home.  Will follow up for single vessel  bypass and aortic valve replacement and aortic root replacement.      Darcella Gasman. [place], N.P.      Cristy Hilts. Jacinto Halim, MD  Electronically Signed    LRI/MEDQ  D:  02/04/2008  T:  02/04/2008  Job:  [id number]   cc:   Evelene Croon, M.D.  Loma Sender

## 2010-08-25 NOTE — Op Note (Signed)
NAME:  [name], [name]               ACCOUNT NO.:  [id number]   MEDICAL RECORD NO.:  [id number]          PATIENT TYPE:  INP   LOCATION:  2307                         FACILITY:  [facility]   PHYSICIAN:  Evelene Croon, M.D.     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   OPERATIVE PROCEDURE:  Redo median sternotomy, extracorporeal  circulation, coronary artery bypass graft surgery x2 using a sequential  saphenous vein graft to posterior descending and posterolateral branches  of the right coronary artery, replacement of aortic valve and aortic  root with a 23-mm Medtronic freestyle aortic root heart valve with  reimplantation of the coronary arteries, placement of the ascending  aortic aneurysm using a 30-mm Hemashield tube graft using deep  hypothermic circulatory arrest.  Endoscopic vein harvesting from the  right leg.   ATTENDING SURGEON:  Evelene Croon, MD   ASSISTANT:  Salvatore Decent. Cornelius Moras, MD   SECOND ASSISTANT:  Doree Fudge, [facility]   ANESTHESIA:  General endotracheal.   CLINICAL HISTORY:  This patient is a 70 year old white male with a  history of coronary artery disease, status post anterior MI in 1994 and  then subsequently had coronary artery bypass graft surgery x2 with a  left internal mammary graft to the LAD and a saphenous vein graft to  the  first posterolateral branch of the right coronary artery as well as  aortic valve replacement with a St. Jude mechanical valve in 2002 at  [place].  The patient did not know what size valve he had placed, but he  did have a difficult time with re-exploration for bleeding after surgery  and was on a ventilator for several days afterwards.  He subsequently  had a stent placed in the proximal left circumflex about 2 years ago by  Dr. Jacinto Halim.  He said he has done well until the past several weeks when  he developed shortness of breath with minimal activity as well as  fatigue, which he never had before.  He had no chest pain or peripheral  edema.  He underwent a stress Cardiolite exam that showed inferior and  inferior septal ischemia.  Cardiac catheterization showed a 30% proximal  LAD stenosis with a patent left internal mammary graft.  There was a  patent left circumflex stent.  There was a 70% mid right coronary  stenosis with a moderate-sized posterior descending and second  posterolateral branch.  There was a patent saphenous vein graft to the  first posterolateral branch, which was subtotally occluded.  There was  2+ aortic insufficiency through the prosthetic valve  with a markedly  dilated aortic root.  Transesophageal echocardiogram showed at least  moderate aortic insufficiency through the valve in a perivalvular  location.  There was moderate left ventricular dysfunction with ejection  fraction of 35-40%.  There was mild right ventricular dysfunction.  There was mild mitral and tricuspid regurgitation.  There was an  ascending aortic aneurysm.  CT angiogram of the chest showed maximal  diameter of the ascending aneurysm was about 5.5 cm just above the sinus  tubular junction.  This appeared to extend from the level of the aortic  sinuses up to the proximal arch.  After review of the studies and  examination of the patient, it was felt that the best option would be  redo coronary  artery bypass as well as replacement of his aortic valve  and ascending aorta by performing a Bentall procedure.  I discussed the  operative procedure with the patient including the options for valve  replacement.  I discussed the pros and cons of mechanical and tissue  valve replacements.  I felt given his age that a mechanical valve would  probably be the best option for him.  We discussed the alternatives,  benefits, and risks including but not limited to bleeding, blood  transfusion, infection, stroke, myocardial infarction, organ failure,  graft failure, heart block requiring permanent pacemaker, and death.  He  understood and agreed to proceed.   OPERATIVE PROCEDURE:  The patient was taken to the operating room and  placed on the table in the supine position.  After induction of general  endotracheal anesthesia, a Foley catheter was placed in the bladder  using a sterile technique.  Then, the chest, abdomen, and both lower  extremities were prepped and draped in the usual sterile manner.  Transesophageal echocardiogram was performed by Anesthesiology.  This  showed the mechanical valve prosthesis with a moderate periprosthetic  aortic insufficiency.  There was parallel left ventricular dysfunction  with moderate left ventricular hypertrophy.  There was trivial mitral  regurgitation and trivial tricuspid regurgitation.  Right heart function  appeared well preserved.  There was no ASD or PFO.   Then, a section of greater saphenous vein was harvested from the right  leg using the endoscopic vein harvest technique.  This vein was of  medium size and good quality.   Then, the chest was opened through the previous sternotomy incision.  The oscillating saw was used and the sternum was opened without  difficulty.  The sternal edges were retracted and mediastinal structures  were dissected in the back of the sternum.  The left internal mammary  artery was quickly encountered under the  left sternum and carefully  preserved.  Then, the chest retractor was placed.  Dissection was  performed to expose the right atrium and ascending aorta.  The old right  coronary vein graft was identified.  This was diffusely diseased with  calcific plaque.  Then, the patient was heparinized and when an adequate  activated clotting time was achieved, the distal ascending aorta was  cannulated using a 22-French aortic cannula for arterial inflow.  Venous  outflow was achieved using a 36-French malleable single-stage cannula  through a purse-string suture in the right atrial appendage.  An  antegrade cardioplegia and vent cannula was inserted in the aortic root.  The patient was then placed on cardiopulmonary bypass using vacuum-  assisted venous drainage.  There was excellent drainage of the heart.  Then, a left ventricular vent was placed through the right  superior  pulmonary vein.  A retrograde cardioplegic cannula was inserted through  the right atrium and coronary sinus.   Then, a retrograde cardioplegic cannula was inserted through a small  purse-string suture in the superior vena cava for retrograde  cerebroplegia.  The superior vena cava was encircled with a tape.  Then,  the aorta was cross-clamped and 800 mL of cold blood antegrade  cardioplegia was administered in the aortic root with quick arrest of  the heart.  We then began cooling to 18 degrees centigrade.  Topical  hypothermic iced saline was used.  A temperature probe was placed in the  septum insulating pad in the pericardium.  With the heart arrested, the  remainder of harvest dissected free from pericardium to allow exposure  of the inferior wall.  The main right coronary artery was diffusely  diseased with calcific plaque and this extended to the takeoff of the  posterior descending branch.  The posterior descending itself was  located proximally and was soft and suitable for grafting.  The  posterolateral branch was  identified just beyond the previous distal  anastomosis and was a medium size graftable vessel.  Then, the first  distal anastomosis was performed to the posterior descending coronary  artery.  The internal diameter was 1.75 mm.  Conduit used was a segment  greater saphenous vein and anastomosis was performed in a sequential  side-to-side manner using continuous 7-0 Prolene suture.  Flow noted  through the graft was excellent.   Second distal anastomosis was performed to the posterolateral branch.  The internal diameter was also about 1.75 mm.  Conduit used was the same  segment of greater saphenous vein and the anastomosis was performed in a  sequential end-to-side manner using a continuous 7-0 Prolene suture.  Flow noted through the graft was excellent.  Then, another dose of  cardioplegia given into the root as well as down this vein graft.  For  the remainder of the case, retrograde cardioplegia was given in about 20  minute intervals followed by a dose down the right coronary vein graft.  Myocardial temperature was maintained around 10 degrees centigrade.   Then, the patient had cooled down to a bladder temperature of 18 degrees  centigrade.  The BIS was 0.  The head was packed with ice.  The patient  was given steroids by anesthesiology and Forane was used for continued  anesthesia during the circulatory arrest.  Then, the head was placed  into steep Trendelenburg position.  The circulatory arrest was then  begun.  The superior vena cava tape was tightened and the retrograde  cerebroplegia was begun and monitored by perfusion.  The aortic cannula  was then removed from the ascending aorta.  The aorta was transected  just proximal to the takeoff of the innominate artery.  The caliber of  the aorta in this area appeared to be about 3 cm.  A 30-mm Hemashield  tube graft with a 10-mm sidearm was chosen.  This was from lot number  [id number].  It was cut to the appropriate length.  It was  anastomosed to  the proximal aortic arch in an end-to-end manner using a continuous 3-0  Prolene suture with a felt strip to reinforce the anastomosis.  The  anastomosis was then lightly covered with CoSeal for hemostasis.  Then,  circulation was reestablished and a crossclamp was placed just proximal  to the aortic graft sidearm.  Once full bypass was reinstituted, the  tape was removed  from the superior vena cava and the retrograde  cerebroplegia stopped.  Total circulatory arrest time was 20 minutes.  Then, the patient was rewarmed to 23 degrees centigrade.   Then, the proximal portion of the aorta was mobilized from the  surrounding mediastinal structures.  It was opened longitudinally.  The  location of the coronary ostia were identified.  There was significant  calcification around the left coronary ostia, but I was able to mobilize  the left coronary artery as a button, although smaller than ideal.  The  right coronary ostia was also mobilized as a button.  The excess  aneurysmal aorta was excised.  Examination of the mechanical valve  showed that there was significant pannus formation, which may have been  keeping one of the leaflets from completely closing causing  regurgitation through the valve.  There was also a perivalvular leak,  easily visible posteriorly along the noncoronary sinus where the sewing  ring was separated from the annulus.  The old sutures were removed and  the valve was carefully removed from the annulus.  There was significant  calcification along the posterior annulus in the noncoronary sinus.  This calcium was removed with rongeurs.  Extended down onto the anterior  leaflet of the mitral valve.  Care was taken to remove all particulate  debris.  The valve that we removed was measured and appeared to be a 19-  mm St. Jude mechanical valve, which I thought was very small for this  patient with a body surface area of 2.33.  The annulus was relatively  small  and was very fibrotic and had significant calcium as mentioned  above.  I did not feel that we would be able to get much larger than a  19-mm valve back in this opening without causing problems.  Given the  patient's body surface area and the fact that he has decreased left  ventricular function and symptoms of congestive heart failure, I felt  that it would be best to give him the largest valve size possible.  I  did not feel this would be possible with a mechanical valve and  therefore decided to replace his valve with a Medtronic freestyle  porcine root.  The annulus was measured and a 23-mm Medtronic freestyle  aortic root heart valve was chosen.  This had serial number [id number].  Then, a series of 4-0 Ethibond simple sutures were placed around the  aortic annulus.  This required about 30 sutures.  These were then placed  through the proximal edge of the porcine root.  The porcine root was  oriented so that the left coronary ostium of the porcine root lined up  with the patient's right coronary button.  The right porcine ostium was  aligned with the noncoronary sinus of the patient.  Then, the valve was  lowered into place and the sutures were tied sequentially.  The porcine  root seated nicely.  The leaflets were checked and there were no  injuries and no suture seen.  The proximal anastomosis was then lightly  coated with CoSeal for hemostasis.  Then, the left coronary button was  anastomosed to the porcine noncoronary sinus.  A small opening was made  with a scalpel and then a 4.5-mm punch was used to make an opening.  The  left coronary button was anastomosed in an end-to-side manner using a  continuous 5-0 Prolene suture.  This anastomosis was lightly coated with  CoSeal for hemostasis.  Then, the right coronary  button was anastomosed  to the left coronary sinus of the porcine root.  The left coronary  ostium was excised with scissors and then the patient's right coronary   button was anastomosed to this in an end-to-end side manner using a  continuous 5-0 Prolene suture.  This anastomosis was lightly coated with  CoSeal for hemostasis.  The porcine right coronary ostium was suture-  ligated with a 3-0 Prolene suture ligature.  Then, the Hemashield tube  graft was cut to appropriate length and was anastomosed to the end of the porcine root in an end-to-end fashion using a continuous 3-0 Prolene  suture.  This anastomosis was also coated with CoSeal for hemostasis.   Then, the proximal vein graft anastomosis was performed to the  Hemashield graft in an end-to-side manner using a continuous 6-0 Prolene  suture.  The patient's old posterolateral vein graft had been transected  proximally and was suture-ligated.  It was not suture-ligated distally  because the graft was diffusely calcified and I was concerned about  embolizing plaque if I had ligated it distally.  Then, with all the  anastomosis completed, the left side of the heart was de-aired.  The  patient had been rewarming to 37 degrees centigrade.  The head was  placed in the Trendelenburg position and the crossclamp was removed with  time of 283 minutes.  There was spontaneous return of ventricular  fibrillation.  The patient was defibrillated into sinus rhythm.  The  proximal and distal anastomoses appeared hemostatic and the lie of the  saphenous vein graft was satisfactory.  Graft markers were placed around  the proximal anastomosis.  Two temporary right ventricular and right  atrial pacing wires were placed above the skin.  The anastomoses were  all examined for hemostasis and appeared adequate.  Then, when the  patient had rewarmed to 37 degrees centigrade, he was weaned from  cardiopulmonary bypass on low-dose dopamine.  Total bypass time was 323  minutes.  Cardiac function appeared excellent.  Transesophageal  echocardiogram showed normal functioning porcine aortic valve.  There  was no evidence  of aortic insufficiency.  There was no mitral  regurgitation.  Left ventricular function appeared well preserved.  There was no significant tricuspid regurgitation and right heart  function appeared preserved.  Then, protamine was given and the venous  cannula was removed.  The aortic sidearm graft was clamped and then  divided and ligated with heavy silk ties followed by a double layer of 4-  0 Prolene suture.  Hemostasis was achieved.  The patient was given 2  units of FFP and 1 unit of platelets for coagulopathy.  Then, 3 chest  tubes were placed with 2 in the posterior pericardium and 1 on the right  pleural space, which had been opened and one in the anterior  mediastinum.  The sternum was then closed with double #6 stainless steel  wires.  The fascia was closed with continuous #1 Vicryl suture.  Subcutaneous tissue was closed with continuous 2-0 Vicryl and skin with  3-0 Vicryl subcuticular closure.  The lower extremity vein harvest site  was closed in layers in similar manner.  The sponge, needles, and  instrument counts were correct according the scrub nurse.  Dry sterile  dressing was applied over the incisions around chest tubes around the  chest tube, which was hooked up for Pleur-Evac suction.  The patient  remained hemodynamically stable and transferred to the SICU in guarded,  but stable condition.  It should also be noted that we did find a 18-gauge injectable needle  inside the patient's pericardial cavity.  This was visible on chest x-  ray.  It was found lying lateral to the right atrium encased in scar  tissue.  It was excised without difficulty and identified and sent to  pathology for formal identification.      Evelene Croon, M.D.  Electronically Signed     BB/MEDQ  D:  02/08/2008  T:  02/09/2008  Job:  [id number]   cc:   Cristy Hilts. Jacinto Halim, MD

## 2010-08-28 NOTE — Discharge Summary (Signed)
[name], [name] NO.:  [id number]   MEDICAL RECORD NO.:  [id number]          PATIENT TYPE:  INP   LOCATION:  3707                         FACILITY:  [facility]   PHYSICIAN:  Richard A. Alanda Amass, M.D.DATE OF BIRTH:  1940-09-09   DATE OF ADMISSION:  06/23/2005  DATE OF DISCHARGE:                                 DISCHARGE SUMMARY   DISCHARGE DIAGNOSES:  1.  Abnormal Cardiolite study.  2.  History of St. Jude aortic valve replacement in 2000.  3.  Coronary disease with a history of anterior myocardial infarction in      1994.  4.  Treated hypertension.  5.  Chronic Coumadin therapy.  6.  Paroxysmal atrial fibrillation with some sick sinus syndrome component.   HOSPITAL COURSE:  [name] is a 70 year old male with a history of remote  MI in 63. He has had a St. Jude AVR and was on chronic Coumadin therapy.  He saw Dr. Jacinto Halim in February with increasing dyspnea on exertion and atrial  flutter. His medications were adjusted. He had an outpatient Cardiolite  study which was abnormal with an EF of 40-50% with anterior wall scar with a  moderate amount of periinfarct ischemia. Echocardiogram showed his EF to be  40-45% with moderate MR. The patient was to be admitted for heparin/Coumadin  crossover on June 23, 2005. He had stopped his Coumadin a couple of days  prior to this. On admission, his INR was 2.6. We feel he will probably not  be therapeutic until Monday. We plan on discharging him today and having him  come back Monday at 10 a.m. for catheterization. He will be put on Lovenox  over the weekend.   DISCHARGE MEDICATIONS:  1.  Lovenox 100 mg subcu q.12h. He will hold Monday a.m.'s dose.  2.  Coumadin on hold.  3.  Coreg 6.25 mg one-and-a-half tablets twice a day.  4.  Diovan 160 mg a day.  5.  Lipitor 20 mg a day.  6.  Lasix 20 mg twice a day.  7.  Folic acid 1 mg a day.  8.  Protonix 40 mg a day.  9.  Baby aspirin daily.   EKG shows atrial flutter  with left bundle-branch block, controlled  ventricular response. White count 8.1, hemoglobin 10.2, hematocrit 29.9,  platelets 183. Sodium 139, potassium 3.5, BUN 30, creatinine 1.5. LFTs are  slightly elevated with an AST of 124, an ALT of 96. TSH is 2.27. Chest x-ray  shows cardiac enlargement, small pleural effusions. INR is 2.7 today on  March 15.   DISPOSITION:  The patient is discharged in stable condition. We will go  ahead and hold his Lipitor as his LFTs are elevated and this may be  contributing to his high INR. He will have a CMET and protime STAT Monday  a.m.      Abelino Derrick, P.A.      Richard A. Alanda Amass, M.D.  Electronically Signed    LKK/MEDQ  D:  06/24/2005  T:  06/24/2005  Job:  [id number]

## 2010-08-28 NOTE — Op Note (Signed)
Juan, Kramer NO.:  [id number]   MEDICAL RECORD NO.:  [id number]          PATIENT TYPE:  INP   LOCATION:  6533                         FACILITY:  [facility]   PHYSICIAN:  [name]. [name], M.D.  DATE OF BIRTH:  11/05/1940   DATE OF PROCEDURE:  06/30/2005  DATE OF DISCHARGE:                                 OPERATIVE REPORT   PROCEDURE PERFORMED:  Electrophysiologic study and radio frequency catheter  ablation of atrial flutter.   INTRODUCTION:  The patient is a 71 year old man with nonischemic  cardiomyopathy, status post aortic valve replacement who is admitted to the  hospital with atrial flutter and rapid ventricular response and was  subsequently rate controlled with AV nodal blocking drugs.  He underwent  catheterization demonstrating left circumflex disease for which he underwent  stenting.  The patient subsequently is now referred for electrophysiologic  study and catheter ablation of the atrial flutter.  Of note, the patient's  TEE demonstrated no left atrial appendage thrombus.   DESCRIPTION OF PROCEDURE:  After informed consent was obtained, the patient  was taken to the diagnostic electrophysiology laboratory in a fasting state.  After usual preparation and draping, intravenous fentanyl and Midazolam were  given for sedation.  A 6 French hexapolar catheter was inserted  percutaneously in the right jugular vein and advanced to the coronary sinus.  A 7 French 20 pole halo catheter was inserted percutaneously in the right  femoral vein and advanced to the right atrium.  A 5 French quadripolar  catheter was inserted percutaneously in the right femoral vein and advanced  to the His bundle region.  Mapping was carried out.  Activation of the right  atrium was counterclockwise around the tricuspid valve annulus and in the  coronary sinus, the earliest activation was in the proximal CS compared to  the distal.  With all of the above demonstrating typical  atrial flutter, the  ablation catheter was maneuvered into the right atrium and three radio  frequency energy applications were delivered in the atrial flutter isthmus  between the tricuspid valve annulus in the adjacent ridge resulting in  termination of atrial flutter and restoration of sinus rhythm.  Pacing was  then carried out demonstrating residual atrial flutter isthmus conduction.  A total of seven additional radio frequency energy applications were then  delivered to the usual atrial flutter isthmus resulting in the creation of  isthmus block.  Three bonus radio frequency energy applications were then  delivered and the patient was observed for 30 minutes at which time she had  no evidence of recurrent atrial flutter isthmus conduction.  At this point  rapid ventricular pacing was carried out from the right ventricular apex at  a pace cycle length 600 msec, stepwise decreased down to 340 msec where VA  Wenckebach was observed.  During rapid ventricular pacing the atrial  activation was midline and decremental.   __________.  b.  Baseline intervals.  The atrial flutter cycle length was 245 msec.  The  HV interval was 85 msec.  c.  Rapid ventricular pacing.  Rapid ventricular pacing was  carried out from  the RV apex and stepwise decreased down to 340 msec where VA Wenckebach was  observed.  During rapid ventricular pacing, the atrial activation was  midline and decremental.  d.  Programmed ventricular stimulation.  Programmed ventricular stimulation  was carried out from the RV apex at basic drive cycle length of 161 msec.  The S1-S2 interval was stepwise decreased from 540 msec down to 460 msec  where the retrograde AV node ERP was observed.  During programmed  ventricular stimulation the atrial activation was midline and decremental.   Programmed atrial stimulation.  Programmed atrial stimulation was carried  out from the coronary sinus and high right atrium at basic drive  cycle  length of 096 msec.  The S1-S2 interval was stepwise decreased down to 300  msec where the AV node ERP was observed. During programmed atrial  stimulation there were no AH jumps or echo beats.  e.  Rapid atrial pacing.  Rapid atrial pacing was carried out from the  coronary sinus and high right atrium at pace cycle length of 600 msec and  stepwise decreased down to 490 msec where AV Wenckebach was observed.  During rapid atrial pacing the P-R interval ws less than the R-R interval  and there was no inducible SVT.   f.  Programmed atrial stimulation.  Programmed atrial stimulation was  carried out from the coronary sinus and high right atrium at basic drive  cycle length of 045 msec.  The S1-S2 interval was stepwise decreased down to  300 msec where the AV node ERP was observed. During programmed atrial  stimulation there were no AH jumps and no echo beats noted.   g.  Arrhythmias observed.  1.  Atrial flutter.  Initiation, present at time of electrophysiologic      study.  Duration was sustained.  Termination was with catheter ablation.      Cycle length was 245 msec.   f.  Mapping of atrial flutter.  Mapping of atrial flutter demonstrated  typical counterclockwise tricuspid annular re-entry.  Radio frequency energy application.  Total of 13 RF energy applications were  delivered.  During the third RF energy application, atrial flutter was  terminated and sinus rhythm restored.  During the 10th RF energy  application, the isthmus block was created and three bonus RF energy  applications were delivered after this.   CONCLUSION:  This study demonstrates successful electrophysiologic study and  RF catheter ablation of typical atrial flutter with a total of 13 radio  frequency energy applications delivered at the usual atrial flutter isthmus  resulting in the termination of atrial flutter, restoration of sinus rhythm,  and creation of bidirectional block in the atrial flutter  isthmus.           ______________________________  [name]. [name], M.D.     GWT/MEDQ  D:  06/30/2005  T:  07/02/2005  Job:  [id number]   cc:   Gerlene Burdock A. Alanda Amass, M.D.  Fax: [phone]   Cristy Hilts. Jacinto Halim, MD  Fax: [phone]

## 2010-08-28 NOTE — Cardiovascular Report (Signed)
[name], [name] NO.:  [id number]   MEDICAL RECORD NO.:  [id number]          PATIENT TYPE:  INP   LOCATION:  6533                         FACILITY:  [facility]   PHYSICIAN:  Dani Gobble, MD       DATE OF BIRTH:  1940-09-19   DATE OF PROCEDURE:  06/30/2005  DATE OF DISCHARGE:                              CARDIAC CATHETERIZATION   REFERRING PHYSICIAN:  Dr. Cristy Hilts. Ganji and Dr. Loma Sender.   PROCEDURE:  Transesophageal echocardiogram.   INDICATION:  Atrial flutter and a St. Jude AVR. This is requested to assess  for the possibility of clot in anticipation of possible atrial  flutter/ablation by Dr. Lewayne Bunting.   COMPLICATIONS:  None immediate.   ANESTHESIA:  The patient was sedated with Fentanyl 100 mcg IV and Versed 5  mg IV with good result. The patient was intubated without difficulty.   RESULTS:  1.  Mild to moderate left atrial enlargement with minimal spontaneous      contrast and no clot appreciated.  2.  Mild right atrial enlargement. No clot noted.  3.  Left atrial appendage was large without clot or stroke, and the exit      velocities were acceptable at approximately 40 cm per second.  4.  The interatrial septum was intact and the bubble study was negative for      __________ .  5.  St. Jude AVR appeared to be well seated. No rocking motion was      perceived. The leaflets were difficult to visualize although the overall      leaflet motion appeared grossly normal. No obvious clots or masses were      appreciated. There was moderate AI appreciated as a central __________ .      It does not appear to be perivalvular in origin.  6.  Mitral valve appeared structurally normal with mild central jet of      mitral valve regurgitation.  7.  The pulmonic valve  was not well visualized, but appeared to be grossly      normal.  8.  The tricuspid valve appeared structurally with mild tricuspid      regurgitation.  9.  The right ventricle was normal  in size, however the endocardium was not      well visualized enough to comment on the right ventricular systolic      function.  10. The left ventricle was mildly dilated with anteroseptal severe      hypokinesis to akinesis with a diminished overall ejection fraction      estimated at 25% to 30%. No clot or __________ was noted and no obvious      thrombus      was appreciated in the views visualized.  11. The proximal aorta was at the upper limits of normal in size, measuring      4.0 cm. Otherwise, the aorta was notable for mild plaque, but none      complex.           ______________________________  Dani Gobble, MD     AB/MEDQ  D:  06/30/2005  T:  07/02/2005  Job:  [id number]   cc:   Cristy Hilts. Jacinto Halim, MD  Fax: [phone]   Loma Sender  Fax: [phone]   Doylene Canning. Ladona Ridgel, M.D.  [address]  [address]  [place]  [place] [address]

## 2010-08-28 NOTE — Discharge Summary (Signed)
NAMEDARRILL, VREELAND NO.:  [id number]   MEDICAL RECORD NO.:  [id number]          PATIENT TYPE:  INP   LOCATION:  3712                         FACILITY:  [facility]   PHYSICIAN:  Abelino Derrick, P.A.   DATE OF BIRTH:  [birth year]   DATE OF ADMISSION:  06/28/2005  DATE OF DISCHARGE:  07/06/2005                                 DISCHARGE SUMMARY   DISCHARGE DIAGNOSIS:  1.  Dyspnea on exertion, probably multifactorial-related coronary disease      and atrial flutter.  2.  Coronary disease, Vision stent placed to the circumflex on June 28, 2005.  3.  Coronary artery bypass grafting x2 and St. Jude aortic valve replacement      at [facility] in 2000.  4.  Atrial flutter status post radiofrequency ablation this admission.  5.  Treated hypertension.  6.  Coumadin therapy with resistant INR.  7.  Treated dyslipidemia.  8.  Left ventricular dysfunction with an ejection fraction of 25%-30%.  9.  Left bundle branch block.   HOSPITAL COURSE:  The patient is a 70 year old male followed by Dr. Jacinto Halim.  He had St. Jude AVR and bypass surgery x2 in 2000 at [facility].  He presented with  dyspnea on exertion and atrial flutter.  A Cardiolite study was abnormal  with an EF of 40%-50% with scars and peri-infarct ischemia.  Echocardiogram  shows EF to be 40%-45%.  We stopped his Coumadin as an outpatient and  admitted him for heparin crossover on June 23, 2005, but his INR was still  too high.  He was sent home on Lovenox and readmitted June 28, 2005.  He  underwent catheterization June 28, 2005, by Dr. Jacinto Halim with an 85%  circumflex stenosis dilated and treated with a Vision stent.  He tolerated  this well.  The LIMA to the LAD was patent and SVG to the RCA was patent.  The patient was seen in consultation by Dr. Ladona Ridgel for atrial flutter.  He  underwent radiofrequency ablation for this which was successful.  He was put  back on Coumadin and heparin.  His INR was resistant, and he is on a  fairly  high dose of Coumadin at home.  Dr. Jacinto Halim wants him on Plavix for 2 weeks  and then aspirin and Coumadin.  We tried to increase his medications during  this admission, but he had problems with hypotension.  His EF at  catheterization was 25%-30%.  His INR on July 06, 2005, is 1.8.  Dr. Allyson Sabal  feels he can go on Lovenox now until his INR is therapeutic.  He will follow  up with Dr. Jacinto Halim in a couple weeks.  Dr. Jacinto Halim did increase his Coreg to  12.5 mg b.i.d. prior to discharge.   DISCHARGE MEDICATIONS:  1.  Lovenox 100 mg twice a day for 6 doses.  2.  Coumadin 15 mg Tuesday, Thursday, and Saturday and 10 mg other days.  3.  Plavix 75 mg once a day for the next 8 days.  4.  Coated aspirin once a day.  5.  Coreg 12.5 mg b.i.d.  6.  Lasix 20 mg a day.  7.  Lipitor 10 mg a day.  8.  Diovan 80 mg day, 40 mg a day.  9.  Nitroglycerin sublingual p.r.n.   LABORATORY DATA:  INR is 1.8.  White count 5.7, hemoglobin 11.1 hematocrit  32.6, platelets 290.  Sodium 136, potassium 4.0, BUN 15, creatinine 1.3.  CK-  MB and troponin are negative.  Chest x-ray shows cardiac enlargement, small  effusions, surgical changes.  Telemetry shows sinus rhythm with left bundle  branch block.   DISPOSITION:  The patient is discharged in stable condition.  He will have  an INR Friday with Dr. Vear Clock who follows his pro times.  He will see Dr.  Jacinto Halim on July 27, 2005.      Abelino Derrick, P.ALenard Lance  D:  07/06/2005  T:  07/07/2005  Job:  [id number]   cc:   Cristy Hilts. Jacinto Halim, MD  Fax: [phone]   Loma Sender  Fax: [phone]

## 2010-08-28 NOTE — Cardiovascular Report (Signed)
[name], [name] NO.:  [id number]   MEDICAL RECORD NO.:  [id number]          PATIENT TYPE:  INP   LOCATION:  6533                         FACILITY:  [facility]   PHYSICIAN:  Juan Hilts. Jacinto Halim, MD       DATE OF BIRTH:  [birth year]   DATE OF PROCEDURE:  06/28/2005  DATE OF DISCHARGE:                              CARDIAC CATHETERIZATION   PROCEDURES PERFORMED:  Right and left heart catheterization including:   1.  Calculation of cardiac output and cardiac index by Fick.  2.  Selective right and left coronary arteriography.  3.  Saphenous vein graft and left internal mammary artery arteriography.   INDICATIONS:  [name] is a 70 year old gentleman with a history of  mechanical St. Jude aortic valve replacement in 2000, history of coronary  artery disease with anterior wall myocardial infarction in 1994, who was  treated medically at that time.  Presents with dyspnea on exertion and  diastolic heart failure.  He underwent echocardiographic evaluation, which  revealed anterior wall hypokinesis and an ejection fraction of 40-45%, and a  Cardiolite stress test revealed anterior wall scarring, inferior wall scar  with mild peri-infarct ischemia.  Given the abnormal Cardiolite stress test  and decreased ejection fraction and his symptoms, he was brought to the  cardiac catheterization lab to evaluate his coronary anatomy.  Right heart  catheterization was performed to evaluate his cardiac output and cardiac  index given decreased ejection fraction and shortness of breath to evaluate  for pulmonary hypertension.   HEMODYNAMIC DATA:  The aortic pressures were 95/60 with a mean of 74 mmHg.  The mechanical aortic valve was not crossed; hence, left ventricular  pressures were not obtained.   ANGIOGRAPHIC DATA:  1.  Right coronary artery:  The right coronary artery is a large-caliber      vessel that is a dominant vessel.  It has mild disease proximally and      has an  80% midstenosis, focal stenosis.  It gives origin to a large PDA      and a large PLA.  2.  Left main:  The left main has mild calcification.  3.  Circumflex:  The circumflex is a large-caliber vessel.  It has an acute-      angle takeoff.  It has a mildly calcified 85% mid-stenosis.  The      circumflex is very large, supplies a large part of the lateral wall.  4.  Ramus intermediate:  The ramus intermediate is a large-caliber vessel.      It has an ostial 40% stenosis.  Otherwise, the ramus is widely patent      and has mild luminal irregularities.  5.  LAD:  LAD has mild calcification proximally.  It gives origin to two      small diagonals in the proximal segment and a smaller diagonal #3 in the      midsegment.  There was mild 40-50% stenosis in the LAD.  There is      competitive filling noted in the distal LAD.  6.  SVG to RCA:  SVG to RCA is widely patent.  7.  LIMA to LAD:  LIMA to LAD is widely patent.   RIGHT HEART CATHETERIZATION:   HEMODYNAMIC DATA:  1.  RA pressures were 13/13 with a mean of 10 mmHg.  2.  RV pressures were 37/6 with an end-diastolic pressure of 7 mmHg.  3.  PA pressures were 38/20 with a mean of 29 mmHg.  4.  Pulmonary capillary wedge was 23/21 with a mean of 18 mmHg.  5.  Cardiac output by Fick was 3.8 with an index of 1.7.  The FA saturation      was 97%, PA saturation was 59%.   INTERVENTION DATA:  Successful PTCA and direct stenting of the mid  circumflex with a 3.0 x 23 mm Vision stent deployed at 16 atmosphere of  pressure.  This stent performed at 12 atmospheres pressure.  This stent was  post-dilated with a 3.25 x 15 mm Quantum at 16 atmospheres of pressure for  one minute and 12 atmospheres of pressure for 30 seconds.  Overall, the  stenosis was reduced from 85% to 0%.  TIMI-3 to TIMI-3 flow maintained at  the end the procedure.   A total of 215 mL of contrast was utilized for the diagnostic and  interventional procedure.    RECOMMENDATIONS:  1.  The patient will be placed on Coumadin plus Plavix for at least weeks      and then the patient will be continued on Coumadin and aspirin      indefinitely.  He does have a mechanical aortic valve.  2.  He is being referred to Duke Salvia, M.D., for possible atrial      flutter ablation.  He will either undergo the atrial flutter ablation      during this hospitalization or he may decide to wait for two weeks on      Coumadin before he will have an EP study and atrial flutter ablation.      Otherwise, if he does not have atrial flutter ablation, the patient will      be discharged home when his INR is therapeutic at 2.5.   TECHNIQUE OF THE PROCEDURE:  Under usual sterile precautions using a 6 right  femoral arterial access and a 7 French right femoral venous access, a Swan-  Ganz catheter was advanced through the venous sheath and right heart  catheterization was performed in the usual fashion after advancing the  balloon-tip catheter through the inferior vena cava, right atrium, right  ventricle and into the pulmonary artery, and pulmonary capillary wedge was  easily obtained.  PA saturation was also obtained.  After obtaining  hemodynamics, the catheter was pulled out of the body in the usual fashion.   A 6 [place] multipurpose B2 catheter was advanced into the ascending aorta  over a 0.035-inch J-wire.  The catheter was manipulated to engage the left  main coronary artery and angiography was performed.  Then the same catheter  was utilized to engage the right coronary artery and angiography was  performed.  Then the same catheter was utilized to engage the SVG to RCA and  angiography was performed.  Then the catheter was pulled out of the body and  a 5 [place] LIMA catheter was utilized to engage the left internal mammary  artery and angiography was performed.  Then the catheter was pulled out of the body in the usual fashion.   TECHNIQUE OF INTERVENTION:   Using Integrilin and IV heparin for  anticoagulation and maintaining ACT at therapeutic range, a 7 [place] FL4  guide was advanced into the ascending aorta and the left main coronary  artery was selectively engaged.  A 190 cm x 0.014 inch Asahi Prowater wire  was advanced into the circumflex coronary artery and angiography was  completed.  Then I decided to proceed with direct stenting. I was able to  advance a 3.0 x 23 mm Vision stent, which is a non-DES stent, into the  circumflex coronary artery with mild difficulty because of mild  calcification and acute angle takeoff.  Then I deployed the stent at 12  atmospheres' pressure for one minute and the balloon was deflated, pulled  back into the guiding catheter, and angiography was repeated.  Then I  decided to proceed with a post-dilatation with the 3.25 x 15 mm Quantum.  However, in spite of deep-throating and aggressive manipulation, I was  unable to cross through the stent as with an acute angle takeoff, the tip of  the balloon was being obstructed by the stent strut.  Hence, I used a 300 cm  x 0.014 inch Wiggle wire and I was able to gently advance the balloon into  the stent, and the Wiggle wire was carefully then advanced with the help of  the balloon support into the distal circumflex and angiography was repeated.  Then balloon angioplasty at 16 atmospheres pressure for 50 seconds and 12  atmospheres of pressure for 30 seconds was performed within the stent strut.  Post balloon dilatation, excellent results were noted with TIMI-3 flow.  I  gave 200 mcg of intracoronary nitroglycerin.  Post angioplasty  balloon dilatation with this balloon, facilitated easy advancement and  retrieval of the balloon without any significant obstruction.  The patient  tolerated the procedure well.  No immediate complications were noted.  The  patient remained asymptomatic throughout the entirety of the procedure.  No  immediate complication  noted.      Juan Hilts. Jacinto Halim, MD  Electronically Signed     JRG/MEDQ  D:  06/28/2005  T:  06/29/2005  Job:  [id number]   cc:   Loma Sender  Fax: [phone]

## 2011-01-11 LAB — DIFFERENTIAL
Basophils Absolute: 0
Eosinophils Absolute: 0.3
Eosinophils Relative: 4
Lymphocytes Relative: 21
Lymphs Abs: 1.2
Neutro Abs: 3.6

## 2011-01-11 LAB — TYPE AND SCREEN: Antibody Screen: NEGATIVE

## 2011-01-11 LAB — POCT I-STAT, CHEM 8
BUN: 15
Calcium, Ion: 1.16
Calcium, Ion: 1.19
Chloride: 105
HCT: 25 — ABNORMAL LOW
Hemoglobin: 8.5 — ABNORMAL LOW
Potassium: 4.4
TCO2: 22

## 2011-01-11 LAB — BLOOD GAS, ARTERIAL
Acid-base deficit: 0.1
Drawn by: 206361
FIO2: 0.21
O2 Saturation: 98
Patient temperature: 98.6

## 2011-01-11 LAB — GLUCOSE, CAPILLARY
Glucose-Capillary: 117 — ABNORMAL HIGH
Glucose-Capillary: 125 — ABNORMAL HIGH
Glucose-Capillary: 179 — ABNORMAL HIGH
Glucose-Capillary: 189 — ABNORMAL HIGH

## 2011-01-11 LAB — CBC
HCT: 23.4 — ABNORMAL LOW
HCT: 25.1 — ABNORMAL LOW
HCT: 28.1 — ABNORMAL LOW
HCT: 30.2 — ABNORMAL LOW
HCT: 30.3 — ABNORMAL LOW
Hemoglobin: 10.3 — ABNORMAL LOW
Hemoglobin: 8.3 — ABNORMAL LOW
Hemoglobin: 9.5 — ABNORMAL LOW
Hemoglobin: 9.6 — ABNORMAL LOW
Hemoglobin: 9.8 — ABNORMAL LOW
Hemoglobin: 9.9 — ABNORMAL LOW
MCHC: 32.4
MCHC: 32.5
MCHC: 32.7
MCHC: 32.8
MCHC: 33.7
MCV: 85.6
MCV: 86.9
MCV: 87.4
Platelets: 124 — ABNORMAL LOW
Platelets: 137 — ABNORMAL LOW
Platelets: 137 — ABNORMAL LOW
Platelets: 198
Platelets: 258
RBC: 2.88 — ABNORMAL LOW
RBC: 3.34 — ABNORMAL LOW
RBC: 3.43 — ABNORMAL LOW
RDW: 15.2
RDW: 15.4
RDW: 15.7 — ABNORMAL HIGH
RDW: 15.7 — ABNORMAL HIGH
RDW: 15.9 — ABNORMAL HIGH
WBC: 12.4 — ABNORMAL HIGH
WBC: 13.4 — ABNORMAL HIGH
WBC: 16 — ABNORMAL HIGH
WBC: 5.9
WBC: 6.2
WBC: 9.7

## 2011-01-11 LAB — HEPARIN LEVEL (UNFRACTIONATED)
Heparin Unfractionated: 0.36
Heparin Unfractionated: 0.51
Heparin Unfractionated: 0.65

## 2011-01-11 LAB — POCT I-STAT GLUCOSE
Glucose, Bld: 109 — ABNORMAL HIGH
Operator id: 3342

## 2011-01-11 LAB — POCT I-STAT 3, ART BLOOD GAS (G3+)
Acid-base deficit: 1
Acid-base deficit: 3 — ABNORMAL HIGH
Bicarbonate: 21.4
Bicarbonate: 25.5 — ABNORMAL HIGH
Bicarbonate: 25.6 — ABNORMAL HIGH
Bicarbonate: 26.3 — ABNORMAL HIGH
O2 Saturation: 81
Patient temperature: 36
Patient temperature: 36.7
TCO2: 27
TCO2: 27
TCO2: 28
pCO2 arterial: 44.9
pCO2 arterial: 52 — ABNORMAL HIGH
pH, Arterial: 7.311 — ABNORMAL LOW
pH, Arterial: 7.364

## 2011-01-11 LAB — COMPREHENSIVE METABOLIC PANEL
ALT: 18
ALT: 30
ALT: 32
AST: 28
Albumin: 3.4 — ABNORMAL LOW
Alkaline Phosphatase: 62
BUN: 11
BUN: 16
CO2: 25
Calcium: 8.6
Calcium: 8.9
Chloride: 110
Creatinine, Ser: 0.98
Creatinine, Ser: 1.14
GFR calc Af Amer: >60
GFR calc Af Amer: >60
GFR calc non Af Amer: >60
Glucose, Bld: 101 — ABNORMAL HIGH
Glucose, Bld: 103 — ABNORMAL HIGH
Potassium: 3.8
Potassium: 4.9
Sodium: 138
Sodium: 138
Total Bilirubin: 0.5
Total Protein: 6.1
Total Protein: 6.1

## 2011-01-11 LAB — BASIC METABOLIC PANEL
BUN: 10
BUN: 13
CO2: 25
CO2: 25
Calcium: 8.1 — ABNORMAL LOW
Calcium: 8.1 — ABNORMAL LOW
Calcium: 8.3 — ABNORMAL LOW
Chloride: 107
Chloride: 111
Creatinine, Ser: 0.9
GFR calc Af Amer: >60
GFR calc Af Amer: >60
GFR calc non Af Amer: >60
GFR calc non Af Amer: >60
Glucose, Bld: 151 — ABNORMAL HIGH
Glucose, Bld: 76
Glucose, Bld: 95
Potassium: 3.3 — ABNORMAL LOW
Potassium: 4.1
Potassium: 4.2
Sodium: 136
Sodium: 139
Sodium: 140

## 2011-01-11 LAB — PREPARE FRESH FROZEN PLASMA

## 2011-01-11 LAB — POCT I-STAT 4, (NA,K, GLUC, HGB,HCT)
Glucose, Bld: 105 — ABNORMAL HIGH
Glucose, Bld: 107 — ABNORMAL HIGH
Glucose, Bld: 130 — ABNORMAL HIGH
Glucose, Bld: 142 — ABNORMAL HIGH
Glucose, Bld: 166 — ABNORMAL HIGH
Glucose, Bld: 182 — ABNORMAL HIGH
HCT: 22 — ABNORMAL LOW
HCT: 25 — ABNORMAL LOW
HCT: 25 — ABNORMAL LOW
HCT: 29 — ABNORMAL LOW
HCT: 33 — ABNORMAL LOW
Hemoglobin: 11.2 — ABNORMAL LOW
Hemoglobin: 7.5 — CL
Hemoglobin: 7.8 — CL
Hemoglobin: 7.8 — CL
Hemoglobin: 8.5 — ABNORMAL LOW
Potassium: 4.4
Potassium: 4.8
Potassium: 5.4 — ABNORMAL HIGH
Potassium: 5.9 — ABNORMAL HIGH
Sodium: 130 — ABNORMAL LOW
Sodium: 136
Sodium: 136
Sodium: 144

## 2011-01-11 LAB — PREPARE PLATELET PHERESIS

## 2011-01-11 LAB — HEMOGLOBIN A1C
Hgb A1c MFr Bld: 5.2
Mean Plasma Glucose: 103

## 2011-01-11 LAB — MAGNESIUM
Magnesium: 2.5
Magnesium: 3 — ABNORMAL HIGH

## 2011-01-11 LAB — PROTIME-INR
INR: 1.4
INR: 1.4
INR: 1.4
Prothrombin Time: 15.4 — ABNORMAL HIGH
Prothrombin Time: 16.1 — ABNORMAL HIGH
Prothrombin Time: 18.2 — ABNORMAL HIGH

## 2011-01-11 LAB — CREATININE, SERUM
Creatinine, Ser: 1.1
GFR calc Af Amer: >60
GFR calc non Af Amer: >60
GFR calc non Af Amer: >60

## 2011-01-11 LAB — URINALYSIS, ROUTINE W REFLEX MICROSCOPIC
Glucose, UA: NEGATIVE
Ketones, ur: NEGATIVE
Specific Gravity, Urine: 1.019
pH: 5.5

## 2011-01-11 LAB — POCT I-STAT 3, VENOUS BLOOD GAS (G3P V)
pCO2, Ven: 51.1 — ABNORMAL HIGH
pH, Ven: 7.339 — ABNORMAL HIGH
pO2, Ven: 308 — ABNORMAL HIGH

## 2011-01-11 LAB — APTT
aPTT: 36
aPTT: 63 — ABNORMAL HIGH

## 2011-01-12 LAB — CBC
HCT: 28.6 — ABNORMAL LOW
Hemoglobin: 9.4 — ABNORMAL LOW
MCV: 87.1
Platelets: 162
RBC: 3.28 — ABNORMAL LOW
WBC: 12.7 — ABNORMAL HIGH

## 2011-01-12 LAB — BASIC METABOLIC PANEL
BUN: 13
Chloride: 101
GFR calc non Af Amer: >60
Potassium: 4.8
Sodium: 138

## 2011-03-17 ENCOUNTER — Ambulatory Visit (HOSPITAL_COMMUNITY)
Admission: RE | Admit: 2011-03-17 | Discharge: 2011-03-17 | Disposition: A | Discharge status: Home / Self Care | Payer: Medicare Other | Source: Ambulatory Visit

## 2011-03-17 DIAGNOSIS — R0989 Other specified symptoms and signs involving the circulatory and respiratory systems: Secondary | ICD-10-CM | POA: Insufficient documentation

## 2011-03-17 DIAGNOSIS — I359 Nonrheumatic aortic valve disorder, unspecified: Secondary | ICD-10-CM

## 2011-03-17 DIAGNOSIS — R0609 Other forms of dyspnea: Secondary | ICD-10-CM | POA: Insufficient documentation

## 2011-03-17 DIAGNOSIS — Z954 Presence of other heart-valve replacement: Secondary | ICD-10-CM | POA: Insufficient documentation

## 2011-03-17 NOTE — Progress Notes (Signed)
  Echocardiogram 2D Echocardiogram has been performed.  Mercy Moore 03/17/2011, 3:42 PM

## 2011-04-16 ENCOUNTER — Encounter: Payer: Self-pay | Admitting: Internal Medicine

## 2011-04-19 ENCOUNTER — Encounter (HOSPITAL_COMMUNITY): Payer: Self-pay | Admitting: Pharmacy Technician

## 2011-04-23 ENCOUNTER — Ambulatory Visit
Admission: RE | Admit: 2011-04-23 | Discharge: 2011-04-23 | Disposition: A | Discharge status: Home / Self Care | Payer: Medicare Other | Source: Ambulatory Visit | Attending: Cardiovascular Disease | Admitting: Cardiovascular Disease

## 2011-04-23 ENCOUNTER — Other Ambulatory Visit: Payer: Self-pay | Admitting: Cardiovascular Disease

## 2011-04-23 DIAGNOSIS — Z01811 Encounter for preprocedural respiratory examination: Secondary | ICD-10-CM

## 2011-04-26 ENCOUNTER — Other Ambulatory Visit: Payer: Self-pay | Admitting: Cardiovascular Disease

## 2011-04-27 ENCOUNTER — Encounter (HOSPITAL_COMMUNITY)
Admission: RE | Disposition: A | Discharge status: Home / Self Care | Payer: Self-pay | Source: Ambulatory Visit | Attending: Cardiovascular Disease

## 2011-04-27 ENCOUNTER — Ambulatory Visit (HOSPITAL_COMMUNITY)
Admission: RE | Admit: 2011-04-27 | Discharge: 2011-04-27 | Disposition: A | Discharge status: Home / Self Care | Payer: Medicare Other | Source: Ambulatory Visit | Attending: Cardiovascular Disease | Admitting: Cardiovascular Disease

## 2011-04-27 ENCOUNTER — Encounter: Payer: Self-pay | Admitting: Internal Medicine

## 2011-04-27 DIAGNOSIS — I25810 Atherosclerosis of coronary artery bypass graft(s) without angina pectoris: Secondary | ICD-10-CM | POA: Insufficient documentation

## 2011-04-27 DIAGNOSIS — I2510 Atherosclerotic heart disease of native coronary artery without angina pectoris: Secondary | ICD-10-CM | POA: Insufficient documentation

## 2011-04-27 DIAGNOSIS — I509 Heart failure, unspecified: Secondary | ICD-10-CM | POA: Insufficient documentation

## 2011-04-27 HISTORY — PX: LEFT AND RIGHT HEART CATHETERIZATION WITH CORONARY ANGIOGRAM: SHX5449

## 2011-04-27 LAB — POCT I-STAT 3, ART BLOOD GAS (G3+)
Acid-base deficit: 4 mmol/L — ABNORMAL HIGH (ref 0.0–2.0)
Bicarbonate: 22.2 mEq/L (ref 20.0–24.0)
O2 Saturation: 61 %
TCO2: 23 mmol/L (ref 0–100)
TCO2: 25 mmol/L (ref 0–100)
pCO2 arterial: 41.5 mmHg (ref 35.0–45.0)
pO2, Arterial: 33 mmHg — CL (ref 80.0–100.0)

## 2011-04-27 SURGERY — LEFT AND RIGHT HEART CATHETERIZATION WITH CORONARY ANGIOGRAM
Anesthesia: LOCAL

## 2011-04-27 MED ORDER — SODIUM CHLORIDE 0.9 % IV SOLN: 1.0000 mL/kg/h | INTRAVENOUS | Status: DC

## 2011-04-27 MED ORDER — MIDAZOLAM HCL 2 MG/2ML IJ SOLN
INTRAMUSCULAR | Status: AC
  Filled 2011-04-27: qty 2

## 2011-04-27 MED ORDER — LIDOCAINE HCL (PF) 1 % IJ SOLN
INTRAMUSCULAR | Status: AC
  Filled 2011-04-27: qty 30

## 2011-04-27 MED ORDER — SODIUM CHLORIDE 0.9 % IV SOLN
INTRAVENOUS | Status: DC
  Administered 2011-04-27: 1000 mL via INTRAVENOUS

## 2011-04-27 MED ORDER — ENALAPRIL MALEATE 5 MG PO TABS: 10.0000 mg | ORAL_TABLET | Freq: Two times a day (BID) | ORAL | Status: DC

## 2011-04-27 MED ORDER — NITROGLYCERIN 0.2 MG/ML ON CALL CATH LAB
INTRAVENOUS | Status: AC
  Filled 2011-04-27: qty 1

## 2011-04-27 MED ORDER — ONDANSETRON HCL 4 MG/2ML IJ SOLN: 4.0000 mg | Freq: Four times a day (QID) | INTRAMUSCULAR | Status: DC | PRN

## 2011-04-27 MED ORDER — HEPARIN (PORCINE) IN NACL 2-0.9 UNIT/ML-% IJ SOLN
INTRAMUSCULAR | Status: AC
  Filled 2011-04-27: qty 2000

## 2011-04-27 MED ORDER — ENALAPRIL MALEATE 10 MG PO TABS: 10.0000 mg | ORAL_TABLET | Freq: Two times a day (BID) | ORAL | Status: DC

## 2011-04-27 MED ORDER — FENTANYL CITRATE 0.05 MG/ML IJ SOLN
INTRAMUSCULAR | Status: AC
  Filled 2011-04-27: qty 2

## 2011-04-27 MED ORDER — SODIUM CHLORIDE 0.9 % IJ SOLN: 3.0000 mL | INTRAMUSCULAR | Status: DC | PRN

## 2011-04-27 NOTE — Op Note (Signed)
Ervine, Witucki Male, 71 y.o., 1940/08/30  MRN: [id number]  CARDIAC CATHETERIZATION REPORT   Procedures performed:  1. Right and left heart catheterization  2. Selective coronary angiography of reimplanted native coronary arteries 3. Selective angiography of saphenous vein graft to the right coronary artery and left internal mammary artery bypass graft to the LAD artery  Reason for procedure:  Congestive heart failure Valvular heart disease Coronary artery disease  Procedure performed by: Thurmon Fair, MD, [facility]  Complications: none   Estimated blood loss: less than 5 mL   History:  Mr. Alvizo a 71 year old gentleman who underwent aortic valve replacement with mechanical prosthesis roughly 10 years ago but then had to undergo repeat valve replacement due to patient mismatch and peri annular regurgitation. He received a Medtronic freestyle porcine aortic root with reimplantation of the coronary arteries as well as a new sequential saphenous vein graft bypass to the PDA and PLV branches of the right coronary artery. The mammary artery bypass to the LAD was preserved. He now presents with symptoms of congestive heart failure. His echocardiogram shows severe reduction left ventricle systolic function which is now only 25-30%. He does not have angina pectoris.  Consent: The risks, benefits, and details of the procedure were explained to the patient. Risks including death, MI, stroke, bleeding, limb ischemia, renal failure and allergy were described and accepted by the patient. Informed written consent was obtained prior to proceeding.  Technique: The patient was brought to the cardiac catheterization laboratory in the fasting state. He was prepped and draped in the usual sterile fashion. Local anesthesia with 1% lidocaine was administered to the rightgroin area. Using the modified Seldinger technique a 5 French right common femoral artery sheath and a 7 French right common femoral vein sheath  were introduced without difficulty.   Under fluoroscopic guidance a pulmonary artery catheter was advanced to the level of the pulmonary artery. Simultaneous samples of blood were obtained for oxygen saturation in the main pulmonary artery and the femoral artery sheath. Pressures recorded in the pulmonary wedge position pulmonary artery right ventricle and right respectively. Finally a sample of blood for oxygen saturation was submitted from the right atrium.  Under fluoroscopic guidance, a 5 [place] JL4 catheter was advanced to level of the ascending aorta but would not selectively cannulate the ostium of the left main coronary artery., selective cannulation of the left coronary artery, right coronary artery and left ventricle were respectively performed. Several coronary angiograms in a variety of projections were recorded, as well as a left ventriculogram in the RAO projection. Left ventricular pressure and a pull back to the aorta were recorded. No immediate complications occurred. At the end of the procedure, all catheters were removed. After the procedure, hemostasis will be achieved with manual pressure.  Contrast used: 150 mL Omnipaque  Hemodynamic findings:  Aortic pressure 99/63 mm Hg (mean 81 mm Hg)  Left ventricle 109/13  with end-diastolic pressure of 13 mm Hg  Pulmonary artery 41/22 ( mean  27) mm Hg  Right ventricle 31/7 with an end-diastolic pressure of 9 mm Hg  Right atrium A wave 8, V wave 8, mean 4  mm Hg  Cardiac output is 4.34 L per minute (cardiac index  1.91 L per minute per meter sq)   PVR 350 DSC ( index 795); SVR 1451 ( index 3294)  There is evidence of minimal aortic stenosis with a peak instantaneous gradient of 16 mm of mercury, mean gradient of 12 mm mercury, and a valve area of  2 cm sq.   Angiographic Findings:   Considerable difficulty was encountered navigating the anastomosis between the ascending aorta and the prosthetic aortic root. This was especially  a problem when using large curve catheter such as the JL catheters. An exchange length J-tip guidewire was used for catheter exchanges. The left coronary artery was cannulated using a JL 3.5 5 French catheter. The right coronary artery was cannulated with a JR catheter as was the stump of an old saphenous vein graft bypass and the patent saphenous vein graft to the right coronary artery and the LIMA bypass to the LAD. The aortic valve was crossed with a multipurpose A2 catheter for pressure recordings. The left ventricular gram was not performed.  1. The left main coronary artery is reimplanted high in the anterior coronary cusp of the neo- root and shows mild atherosclerosis. It trifurcates fashion into the left anterior descending artery, a medium-sized ramus intermedius artery and the left circumflex coronary artery.  2. The left anterior descending artery is a large vessel that reaches the apex and generates 2 major diagonal branches. There is evidence of mild luminal irregularities and mild calcification. no hemodynamically meaningful stenoses are seen. Competitive flow is seen in the distal LAD from the left internal mammary artery bypass 3. The left circumflex coronary artery is a medium-size vessel non- dominant vessel that generates 2 major oblque marginal arteries, the distal one being much larger. There is evidence of mild luminal irregularities and mild calcification. no hemodynamically meaningful stenoses are seen. 4. The right coronary artery is a large-size dominant vessel that generates a large posterior lateral ventricular system as well as the posterior descending artery. There is extensive atherosclerotic disease in the proximal third of the right coronary artery extending to the mid vessel with a maximum diameter stenosis of 60-70%. There is however TIMI-3 flow. The posterolateral ventricular branch does not fill during injection of the native coronary artery and appears to be downstream of  a very severe stenosis. Competitive flow seen in the posterior descending artery, receives good flow from both the native vessel and a saphenous vein graft bypass 5. The stump of an old saphenous vein graft to the distal right coronary artery is injected 6. The sequential saphenous vein graft to the posterior descending artery and posterior lateral ventricular artery shows ostial obstruction with pressure dampening with a 5 French catheter. It is difficult to quantify the severity of the stenosis but it is probably greater than 80%. Its appearance is atypical for atherosclerotic disease in the issue may be a technical one related to the initial anastomosis. There is relatively slow flow in the saphenous vein graft bypass but there is good opacification of all of the distal branches of the right coronary artery. 7. The left internal mammary artery bypass graft to the distal LAD artery is widely patent and free of stenosis. Mild diffuse disease of the distal LAD artery is noted.    IMPRESSIONS:  No significant evidence of meaningful coronary obstructive lesions that could explain severe cardiomyopathy. The only area suspicious for coronary insufficiency the territory of the posterior lateral ventricular branch of the right coronary artery. The benefit of revascularization of this territory is questionable in a patient that is free of angina pectoris. Hemodynamic findings suggest that the patient is currently close to euvolemic. 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  RECOMMENDATION:  Medical therapy for congestive heart failure. Gradually optimize dose of ACE inhibitors beta blockers and add Spironolactone. Reevaluate left ventricular ejection fraction  after 3 months before making a decision regarding primary prevention implantation of a defibrillator.  Thurmon Fair, MD, [facility] [phone]  04/27/2011 2:00 PM  UJ:[id number] Vear Clock, MD

## 2011-04-27 NOTE — H&P (Signed)
  History reviewed, patient examined, no change in status, stable for surgery.  Thurmon Fair, MD, [facility] [phone] 04/27/2011 7:48 AM

## 2011-04-28 LAB — POCT I-STAT 3, ART BLOOD GAS (G3+)
Acid-base deficit: 3 mmol/L — ABNORMAL HIGH (ref 0.0–2.0)
Bicarbonate: 21.5 mEq/L (ref 20.0–24.0)
TCO2: 23 mmol/L (ref 0–100)

## 2011-06-28 ENCOUNTER — Encounter: Payer: Self-pay | Admitting: Internal Medicine

## 2011-07-28 ENCOUNTER — Encounter: Payer: Self-pay | Admitting: *Deleted

## 2011-07-28 ENCOUNTER — Encounter: Payer: Self-pay | Admitting: Internal Medicine

## 2011-07-28 ENCOUNTER — Ambulatory Visit (INDEPENDENT_AMBULATORY_CARE_PROVIDER_SITE_OTHER): Payer: Medicare Other | Admitting: Internal Medicine

## 2011-07-28 VITALS — BP 128/70 | HR 62 | Resp 18 | Ht 72.0 in | Wt 241.8 lb

## 2011-07-28 DIAGNOSIS — R931 Abnormal findings on diagnostic imaging of heart and coronary circulation: Secondary | ICD-10-CM

## 2011-07-28 DIAGNOSIS — I447 Left bundle-branch block, unspecified: Secondary | ICD-10-CM

## 2011-07-28 DIAGNOSIS — R9439 Abnormal result of other cardiovascular function study: Secondary | ICD-10-CM

## 2011-07-28 DIAGNOSIS — I2510 Atherosclerotic heart disease of native coronary artery without angina pectoris: Secondary | ICD-10-CM

## 2011-07-28 DIAGNOSIS — I5022 Chronic systolic (congestive) heart failure: Secondary | ICD-10-CM

## 2011-07-28 NOTE — Patient Instructions (Addendum)
You have been scheduled for your BIV-ICD on May 6,2013 at 0730 am.   You will need to be at the short stay center at 0530 the morning of the procedure.  A letter of instruction will be mailed out to you

## 2011-08-02 ENCOUNTER — Encounter (HOSPITAL_COMMUNITY): Payer: Self-pay | Admitting: Pharmacy Technician

## 2011-08-09 ENCOUNTER — Encounter: Payer: Self-pay | Admitting: Internal Medicine

## 2011-08-09 DIAGNOSIS — I447 Left bundle-branch block, unspecified: Secondary | ICD-10-CM | POA: Insufficient documentation

## 2011-08-09 DIAGNOSIS — I2510 Atherosclerotic heart disease of native coronary artery without angina pectoris: Secondary | ICD-10-CM | POA: Insufficient documentation

## 2011-08-09 DIAGNOSIS — I5022 Chronic systolic (congestive) heart failure: Secondary | ICD-10-CM | POA: Insufficient documentation

## 2011-08-09 NOTE — Progress Notes (Signed)
Primary Care Physician: PHILLIPS,CHARLES W, MD, MD Referring Physician:  Dr Croitoru   [name] is a 71 y.o. male with a h/o valvular heart disease who presents for EP consultation and risk stratification for sudden death.  The patient has had two separate aortic valve surgeries.  An initial mechanical AVR was replaced with a Medtronic Freestyle root in 2009.  At that time, he also require coronary reimplantation with preservation of the LIMA to LAD and redo SVG to RCA.  He has chronic NYHA Class III CHF with symptoms of dypsnea with moderate activity.  He also has a LBBB.   Today, he denies symptoms of palpitations, chest pain, shortness of breath at rest, orthopnea, PND,  dizziness, presyncope, syncope, or neurologic sequela.  He has stable edema.  The patient is tolerating medications without difficulties and is otherwise without complaint today.   Past Medical History  Diagnosis Date  . Nonischemic cardiomyopathy     EF 25-30%  . Chronic systolic dysfunction of left ventricle   . Valvular cardiomyopathy   . Aortic valve replaced     x2  . CAD (coronary artery disease)     s/p CABG  . LBBB (left bundle branch block)     QRS 180msec   Past Surgical History  Procedure Date  . Aortic valve replacement     s/p 2 separate AVR procedures, an initial mechanical AVR was replaced with a Medtronic Freestyle root in 2009 with reimplantation of the cors as well as redo SVG to RCA and preservation of LIMA to LAD  . Coronary artery bypass graft     Current Outpatient Prescriptions  Medication Sig Dispense Refill  . allopurinol (ZYLOPRIM) 300 MG tablet Take 300 mg by mouth daily.        . atorvastatin (LIPITOR) 20 MG tablet Take 20 mg by mouth daily.        . carvedilol (COREG) 12.5 MG tablet Take 12.5 mg by mouth 2 (two) times daily with a meal.        . clopidogrel (PLAVIX) 75 MG tablet Take 75 mg by mouth daily.        . Copper Gluconate (COPPER CAPS PO) Take 1 capsule by mouth daily.         . enalapril (VASOTEC) 10 MG tablet Take 1 tablet (10 mg total) by mouth 2 (two) times daily.  60 tablet  11  . folic acid (FOLVITE) 800 MCG tablet Take 800 mcg by mouth daily.       . furosemide (LASIX) 40 MG tablet Take 80 mg by mouth daily.        . Omega-3 Fatty Acids (FISH OIL PO) Take 1 capsule by mouth daily. 1200mg      . spironolactone (ALDACTONE) 25 MG tablet Take 25 mg by mouth daily.        . vitamin B-12 (CYANOCOBALAMIN) 1000 MCG tablet Take 1,000 mcg by mouth daily.        . ferrous gluconate (FERGON) 240 (27 FE) MG tablet Take 240 mg by mouth daily.        No Known Allergies  History   Social History  . Marital Status: Married    Spouse Name: N/A    Number of Children: N/A  . Years of Education: N/A   Occupational History  . Not on file.   Social History Main Topics  . Smoking status: Former Smoker    Types: Cigarettes    Quit date: 04/12/1992  . Smokeless tobacco:   Not on file  . Alcohol Use: No  . Drug Use: No  . Sexually Active: Not on file   Other Topics Concern  . Not on file   Social History Narrative  . No narrative on file    No family history on file.  ROS- All systems are reviewed and negative except as per the HPI above  Physical Exam: Filed Vitals:   07/28/11 1530  BP: 128/70  Pulse: 62  Resp: 18  Height: 6' (1.829 m)  Weight: 241 lb 12.8 oz (109.68 kg)    GEN- The patient is well appearing, alert and oriented x 3 today.   Head- normocephalic, atraumatic Eyes-  Sclera clear, conjunctiva pink Ears- hearing intact Oropharynx- clear Neck- supple, no JVP Lymph- no cervical lymphadenopathy Lungs- Clear to ausculation bilaterally, normal work of breathing Heart- Regular rate and rhythm, no murmurs, rubs or gallops, PMI not laterally displaced GI- soft, NT, ND, + BS Extremities- no clubbing, cyanosis, or edema MS- no significant deformity or atrophy Skin- no rash or lesion Psych- euthymic mood, full affect Neuro- strength and  sensation are intact  EKG 04/16/11- sinus rhyhtm PR 208, LBBB (QRS 180ms)  Echo 06/28/11-  LVEDD 57, LVEF <25%, mild LVH, LA 41, mild TR, prosthetic AV not well visualized  Cath 04/27/11- no lesions requiring intervention, medical therapy advised  Assessment and Plan:  

## 2011-08-09 NOTE — Assessment & Plan Note (Signed)
Recent cath reviewed No lesions amenable to revascularization Continue medical therapy

## 2011-08-09 NOTE — Assessment & Plan Note (Signed)
The patient has a nonschemic CM due to valvular heart disease (EF 25%), NYHA Class II/III CHF, and CAD. At this time, he meets SCD-HeFT criteria for ICD implantation for primary prevention of sudden death.  He has a LBBB > in duration and therefore has a class I indication for CRT.  He has been treated with an optimal medical regimen without improvement in his EF.  Risks, benefits, alternatives to BiVICD implantation were discussed in detail with the patient today. The patient  understands that the risks include but are not limited to bleeding, infection, pneumothorax, perforation, tamponade, vascular damage, renal failure, MI, stroke, death, inappropriate shocks, and lead dislodgement and wishes to proceed.  We will therefore schedule device implantation at the next available time.

## 2011-08-09 NOTE — Assessment & Plan Note (Signed)
CRT-D planned as above

## 2011-08-12 ENCOUNTER — Other Ambulatory Visit (INDEPENDENT_AMBULATORY_CARE_PROVIDER_SITE_OTHER): Payer: Medicare Other

## 2011-08-12 DIAGNOSIS — R931 Abnormal findings on diagnostic imaging of heart and coronary circulation: Secondary | ICD-10-CM

## 2011-08-12 DIAGNOSIS — Z7901 Long term (current) use of anticoagulants: Secondary | ICD-10-CM

## 2011-08-12 DIAGNOSIS — R9439 Abnormal result of other cardiovascular function study: Secondary | ICD-10-CM

## 2011-08-12 LAB — CBC WITH DIFFERENTIAL/PLATELET
Basophils Absolute: 0.1 10*3/uL (ref 0.0–0.1)
Eosinophils Absolute: 0.1 10*3/uL (ref 0.0–0.7)
MCHC: 33 g/dL (ref 30.0–36.0)
MCV: 96.2 fl (ref 78.0–100.0)
Monocytes Absolute: 0.8 10*3/uL (ref 0.1–1.0)
Neutrophils Relative %: 78.6 % — ABNORMAL HIGH (ref 43.0–77.0)
Platelets: 266 10*3/uL (ref 150.0–400.0)
WBC: 11 10*3/uL — ABNORMAL HIGH (ref 4.5–10.5)

## 2011-08-13 LAB — BASIC METABOLIC PANEL
GFR: 44.47 mL/min — ABNORMAL LOW (ref >60.00)
Potassium: 4.4 mEq/L (ref 3.5–5.1)
Sodium: 135 mEq/L (ref 135–145)

## 2011-08-16 ENCOUNTER — Ambulatory Visit (HOSPITAL_COMMUNITY)
Admission: RE | Admit: 2011-08-16 | Discharge: 2011-08-17 | Disposition: A | Discharge status: Home / Self Care | Payer: Medicare Other | Source: Ambulatory Visit | Attending: Internal Medicine | Admitting: Internal Medicine

## 2011-08-16 ENCOUNTER — Encounter (HOSPITAL_COMMUNITY): Payer: Self-pay

## 2011-08-16 ENCOUNTER — Encounter (HOSPITAL_COMMUNITY)
Admission: RE | Disposition: A | Discharge status: Home / Self Care | Payer: Self-pay | Source: Ambulatory Visit | Attending: Internal Medicine

## 2011-08-16 DIAGNOSIS — I2589 Other forms of chronic ischemic heart disease: Secondary | ICD-10-CM | POA: Insufficient documentation

## 2011-08-16 DIAGNOSIS — I5022 Chronic systolic (congestive) heart failure: Secondary | ICD-10-CM

## 2011-08-16 DIAGNOSIS — I447 Left bundle-branch block, unspecified: Secondary | ICD-10-CM | POA: Insufficient documentation

## 2011-08-16 DIAGNOSIS — Z951 Presence of aortocoronary bypass graft: Secondary | ICD-10-CM | POA: Insufficient documentation

## 2011-08-16 DIAGNOSIS — R609 Edema, unspecified: Secondary | ICD-10-CM | POA: Insufficient documentation

## 2011-08-16 DIAGNOSIS — I509 Heart failure, unspecified: Secondary | ICD-10-CM | POA: Insufficient documentation

## 2011-08-16 DIAGNOSIS — I2510 Atherosclerotic heart disease of native coronary artery without angina pectoris: Secondary | ICD-10-CM | POA: Insufficient documentation

## 2011-08-16 DIAGNOSIS — Z954 Presence of other heart-valve replacement: Secondary | ICD-10-CM | POA: Insufficient documentation

## 2011-08-16 HISTORY — DX: Heart failure, unspecified: I50.9

## 2011-08-16 HISTORY — PX: OTHER SURGICAL HISTORY: SHX169

## 2011-08-16 HISTORY — PX: BI-VENTRICULAR IMPLANTABLE CARDIOVERTER DEFIBRILLATOR: SHX5459

## 2011-08-16 HISTORY — DX: Essential (primary) hypertension: I10

## 2011-08-16 LAB — BASIC METABOLIC PANEL
GFR calc Af Amer: 57 mL/min — ABNORMAL LOW (ref >90)
GFR calc non Af Amer: 49 mL/min — ABNORMAL LOW (ref >90)
Potassium: 4.6 mEq/L (ref 3.5–5.1)
Sodium: 136 mEq/L (ref 135–145)

## 2011-08-16 LAB — SURGICAL PCR SCREEN: MRSA, PCR: NEGATIVE

## 2011-08-16 SURGERY — BI-VENTRICULAR IMPLANTABLE CARDIOVERTER DEFIBRILLATOR  (CRT-D)
Anesthesia: LOCAL

## 2011-08-16 MED ORDER — SPIRONOLACTONE 25 MG PO TABS
25.0000 mg | ORAL_TABLET | Freq: Every day | ORAL | Status: DC
  Administered 2011-08-17: 25 mg via ORAL
  Filled 2011-08-16 (×2): qty 1

## 2011-08-16 MED ORDER — LIDOCAINE HCL (PF) 1 % IJ SOLN
INTRAMUSCULAR | Status: AC
  Filled 2011-08-16: qty 60

## 2011-08-16 MED ORDER — ALLOPURINOL 300 MG PO TABS
300.0000 mg | ORAL_TABLET | Freq: Every day | ORAL | Status: DC
  Administered 2011-08-17: 300 mg via ORAL
  Filled 2011-08-16 (×2): qty 1

## 2011-08-16 MED ORDER — CEFAZOLIN SODIUM 1-5 GM-% IV SOLN
1.0000 g | Freq: Four times a day (QID) | INTRAVENOUS | Status: AC
  Administered 2011-08-16 (×3): 1 g via INTRAVENOUS
  Filled 2011-08-16 (×3): qty 50

## 2011-08-16 MED ORDER — ONDANSETRON HCL 4 MG/2ML IJ SOLN: 4.0000 mg | Freq: Four times a day (QID) | INTRAMUSCULAR | Status: DC | PRN

## 2011-08-16 MED ORDER — ACETAMINOPHEN 325 MG PO TABS
325.0000 mg | ORAL_TABLET | ORAL | Status: DC | PRN
Start: 2011-08-16 — End: 2011-08-17
  Administered 2011-08-16: 650 mg via ORAL
  Filled 2011-08-16: qty 2

## 2011-08-16 MED ORDER — CLOPIDOGREL BISULFATE 75 MG PO TABS
75.0000 mg | ORAL_TABLET | Freq: Every day | ORAL | Status: DC
  Administered 2011-08-17: 75 mg via ORAL
  Filled 2011-08-16: qty 1

## 2011-08-16 MED ORDER — MIDAZOLAM HCL 5 MG/5ML IJ SOLN
INTRAMUSCULAR | Status: AC
  Filled 2011-08-16: qty 5

## 2011-08-16 MED ORDER — GENTAMICIN IN SALINE 1.6-0.9 MG/ML-% IV SOLN
80.0000 mg | INTRAVENOUS | Status: DC
  Filled 2011-08-16: qty 50

## 2011-08-16 MED ORDER — ENALAPRIL MALEATE 10 MG PO TABS
10.0000 mg | ORAL_TABLET | Freq: Two times a day (BID) | ORAL | Status: DC
  Administered 2011-08-16 – 2011-08-17 (×2): 10 mg via ORAL
  Filled 2011-08-16 (×4): qty 1

## 2011-08-16 MED ORDER — HEPARIN (PORCINE) IN NACL 2-0.9 UNIT/ML-% IJ SOLN
INTRAMUSCULAR | Status: AC
  Filled 2011-08-16: qty 1000

## 2011-08-16 MED ORDER — CEFAZOLIN SODIUM 1-5 GM-% IV SOLN: 1.0000 g | Freq: Once | INTRAVENOUS | Status: DC

## 2011-08-16 MED ORDER — CARVEDILOL 12.5 MG PO TABS
12.5000 mg | ORAL_TABLET | Freq: Two times a day (BID) | ORAL | Status: DC
  Administered 2011-08-16 – 2011-08-17 (×2): 12.5 mg via ORAL
  Filled 2011-08-16 (×5): qty 1

## 2011-08-16 MED ORDER — GENTAMICIN SULFATE 40 MG/ML IJ SOLN
INTRAMUSCULAR | Status: DC
  Administered 2011-08-16: 08:00:00
  Filled 2011-08-16: qty 2

## 2011-08-16 MED ORDER — FENTANYL CITRATE 0.05 MG/ML IJ SOLN
INTRAMUSCULAR | Status: AC
  Filled 2011-08-16: qty 2

## 2011-08-16 MED ORDER — MUPIROCIN 2 % EX OINT: TOPICAL_OINTMENT | Freq: Two times a day (BID) | CUTANEOUS | Status: DC

## 2011-08-16 MED ORDER — HYDROCODONE-ACETAMINOPHEN 5-325 MG PO TABS: 1.0000 | ORAL_TABLET | ORAL | Status: DC | PRN

## 2011-08-16 MED ORDER — MUPIROCIN 2 % EX OINT
TOPICAL_OINTMENT | CUTANEOUS | Status: AC
  Administered 2011-08-16: 1
  Filled 2011-08-16: qty 22

## 2011-08-16 MED ORDER — VITAMIN B-12 1000 MCG PO TABS
1000.0000 ug | ORAL_TABLET | Freq: Every day | ORAL | Status: DC
  Administered 2011-08-16: 1000 ug via ORAL
  Filled 2011-08-16 (×2): qty 1

## 2011-08-16 NOTE — H&P (View-Only) (Signed)
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  The patient has had two separate aortic valve surgeries.  An initial mechanical AVR was replaced with a Medtronic Freestyle root in 2009.  At that time, he also require coronary reimplantation with preservation of the LIMA to LAD and redo SVG to RCA.  He has chronic NYHA Class III CHF with symptoms of dypsnea with moderate activity.  He also has a LBBB.   Today, he denies symptoms of palpitations, chest pain, shortness of breath at rest, orthopnea, PND,  dizziness, presyncope, syncope, or neurologic sequela.  He has stable edema.  The patient is tolerating medications without difficulties and is otherwise without complaint today.   Past Medical History  Diagnosis Date  . Nonischemic cardiomyopathy     EF 25-30%  . Chronic systolic dysfunction of left ventricle   . Valvular cardiomyopathy   . Aortic valve replaced     x2  . CAD (coronary artery disease)     s/p CABG  . LBBB (left bundle branch block)     QRS   Past Surgical History  Procedure Date  . Aortic valve replacement     s/p 2 separate AVR procedures, an initial mechanical AVR was replaced with a Medtronic Freestyle root in 2009 with reimplantation of the cors as well as redo SVG to RCA and preservation of LIMA to LAD  . Coronary artery bypass graft     Current Outpatient Prescriptions  Medication Sig Dispense Refill  . allopurinol (ZYLOPRIM) 300 MG tablet Take 300 mg by mouth daily.        Marland Kitchen atorvastatin (LIPITOR) 20 MG tablet Take 20 mg by mouth daily.        . carvedilol (COREG) 12.5 MG tablet Take 12.5 mg by mouth 2 (two) times daily with a meal.        . clopidogrel (PLAVIX) 75 MG tablet Take 75 mg by mouth daily.        . Copper Gluconate (COPPER CAPS PO) Take 1 capsule by mouth daily.         . enalapril (VASOTEC) 10 MG tablet Take 1 tablet (10 mg total) by mouth 2 (two) times daily.  60 tablet  11  . folic acid (FOLVITE) 800 MCG tablet Take 800 mcg by mouth daily.       . furosemide (LASIX) 40 MG tablet Take 80 mg by mouth daily.        . Omega-3 Fatty Acids (FISH OIL PO) Take 1 capsule by mouth daily. 1200mg       . spironolactone (ALDACTONE) 25 MG tablet Take 25 mg by mouth daily.        . vitamin B-12 (CYANOCOBALAMIN) 1000 MCG tablet Take 1,000 mcg by mouth daily.        . ferrous gluconate (FERGON) 240 (27 FE) MG tablet Take 240 mg by mouth daily.        No Known Allergies  History   Social History  . Marital Status: Married    Spouse Name: N/A    Number of Children: N/A  . Years of Education: N/A   Occupational History  . Not on file.   Social History Main Topics  . Smoking status: Former Smoker    Types: Cigarettes    Quit date: 04/12/1992  . Smokeless tobacco:  Not on file  . Alcohol Use: No  . Drug Use: No  . Sexually Active: Not on file   Other Topics Concern  . Not on file   Social History Narrative  . No narrative on file    No family history on file.  ROS- All systems are reviewed and negative except as per the HPI above  Physical Exam: Filed Vitals:   07/28/11 1530  BP: 128/70  Pulse: 62  Resp: 18  Height: 6' (1.829 m)  Weight: 241 lb 12.8 oz (109.68 kg)    GEN- The patient is well appearing, alert and oriented x 3 today.   Head- normocephalic, atraumatic Eyes-  Sclera clear, conjunctiva pink Ears- hearing intact Oropharynx- clear Neck- supple, no JVP Lymph- no cervical lymphadenopathy Lungs- Clear to ausculation bilaterally, normal work of breathing Heart- Regular rate and rhythm, no murmurs, rubs or gallops, PMI not laterally displaced GI- soft, NT, ND, + BS Extremities- no clubbing, cyanosis, or edema MS- no significant deformity or atrophy Skin- no rash or lesion Psych- euthymic mood, full affect Neuro- strength and  sensation are intact  EKG 04/16/11- sinus rhyhtm PR 208, LBBB (QRS )  Echo 06/28/11-  LVEDD 57, LVEF <25%, mild LVH, LA 41, mild TR, prosthetic AV not well visualized  Cath 04/27/11- no lesions requiring intervention, medical therapy advised  Assessment and Plan:

## 2011-08-16 NOTE — Op Note (Signed)
SURGEON:  Hillis Range, MD      PREPROCEDURE DIAGNOSES:   1. Ischemic cardiomyopathy.   2. New York Heart Association class III, heart failure chronically.   3. Left bundle-branch block.      POSTPROCEDURE DIAGNOSES:   1. Ischemic cardiomyopathy.   2. New York Heart Association class III heart failure chronically.   3. Left bundle-branch block.      PROCEDURES:    1. Coronary Sinus Venography  2. Biventricular ICD implantation.  3. Defibrillation threshold testing     INTRODUCTION:  [name] is a 71 y.o. male with a (EF 25%), NYHA Class III CHF,  CAD, and LBBB QRS morophology. At this time, he meets MADIT II/ SCD-HeFT criteria for ICD implantation for primary prevention of sudden death.  Given LBBB, the patient may also be expected to benefit from resynchronization therapy.  The patient has been treated with an optimal medical regimen but continues to have a depressed ejection fraction and NYHA Class III CHF symptoms.  he therefore  presents today for a biventricular ICD implantation.      DESCRIPTION OF PROCEDURE:  Informed written consent was obtained and the patient was brought to the electrophysiology lab in the fasting state. The patient was adequately sedated with intravenous Versed, and fentanyl as outlined in the nursing report.  The patient's left chest was prepped and draped in the usual sterile fashion by the EP lab staff.  The skin overlying the left deltopectoral region was infiltrated with lidocaine for local analgesia.  A 5-cm incision was made over the left deltopectoral region.  A left subcutaneous defibrillator pocket was fashioned using a combination of sharp and blunt dissection.  Electrocautery was used to assure hemostasis.   RA/RV Lead Placement: The left axillary vein was cannulated with fluoroscopic visualization.  No contrast was required for this endeavor.  Through the left axillary vein, a [employer]. Jude [employer] [place], model 6295MW-41  (serial # [id number]) right  atrial lead and a [employer]. [employer] [place], model 3244W-10 (serial number [id number]) right ventricular defibrillator lead were advanced with fluoroscopic visualization into the right atrial appendage and right ventricular apex positions respectively.  Initial atrial lead P-waves measured 2.7 mV with an impedance of 529 ohms and a threshold of 1 volts at 0.5 milliseconds.  The right ventricular lead R-wave measured 10mV with impedance of 465 ohms and a threshold of 1.4 volts at 0.5 milliseconds.   LV Lead Placement: A Medtronic MB-2 guide was advanced through the left axillary vein into the low lateral right atrium.  A Bard curved Damato catheter was introduced through the MB-2 guide and used to cannulate the coronary sinus.  A coronary sinus selective venography balloon was advanced through the MB- 2 guide and advanced into the proximal portion of the coronary sinus.  A selective coronary sinus venogram was performed by hand injection of nonionic contrast (30cc total given).  This demonstrated a moderate sized coronary sinus with an accessory coronary sinus body.  A small posterolateral branch was visualilzed.   A Whisper CSJ wire was introduced through the MB2 guide and advanced into the distal coronary sinus.  A nice lateral branch was engaged with the wire.  A [employer]. Jude Medical Quadrapolar lead model 1458Q - 86 (serial number [id number]) lead was advanced through the MB-2 into the lateral branch.   This was  approximately one-thirds from the base to the apex in a lateral position.  In this location, the left ventricular lead R-waves measured  9.6 mV  with impedance of 1208 ohms and a threshold of 1.3 volt at 0.5  milliseconds in the bipolar 1-2 configuration with no diaphragmatic  stimulation observed when pacing at 10 volts output.  The MB-2 guide was  therefore removed.     All three leads were secured to the pectoralis  fascia using #2 silk suture over the suture sleeves.  The pocket then  irrigated with  copious gentamicin solution.  The leads were then  connected to a [employer] Medical model CD [phone] - [phone] (serial  Number [id number]) biventricular ICD.  The defibrillator was placed into the  pocket.  The pocket was then closed in 2 layers with 2.0 Vicryl suture  for the subcutaneous and subcuticular layers.  Steri-Strips and a  sterile dressing were then applied.   DFT Testing: Defibrillation Threshold testing was then performed. Ventricular fibrillation was induced with a T shock.  Adequate sensing of ventricular  fibrillation was observed with no dropout with a programmed sensitivity of 1.17mV.  The patient was successfully defibrillated to sinus rhythm with a single 15 joules shock delivered from the device with an impedance of 71 ohms in a duration of 4.5 seconds.  The patient remained in sinus rhythm thereafter.  There were no early apparent complications.  Programmed Extrastimulus testing:  Programmed extrastimulus testing was performed through the device with a basic cycle length of with S1,S2,S3,S4 extrastimuli down to refractoriness (500/240/230/200 msec) with no sustained VT or VF observed.  The procedure was therefore considered completed.  There were no early apparhent complications.     CONCLUSIONS:   1. Ischemic cardiomyopathy with Left bundle-branch block and chronic New York Heart Association class III heart failure.   2. Successful biventricular ICD implantation.   3. DFT less than or equal to 15 joules.   4. No inducible VT or VF with PES  5. No early apparent complications.

## 2011-08-16 NOTE — Interval H&P Note (Signed)
History and Physical Interval Note:  08/16/2011 7:06 AM  [name]  has presented today for surgery, with the diagnosis of low ef  The various methods of treatment have been discussed with the patient and family. After consideration of risks, benefits and other options for treatment, the patient has consented to  Procedure(s) (LRB): BI-VENTRICULAR IMPLANTABLE CARDIOVERTER DEFIBRILLATOR  (CRT-D) (N/A) as a surgical intervention .  The patients' history has been reviewed, patient examined, no change in status, stable for surgery.  I have reviewed the patients' chart and labs.  Questions were answered to the patient's satisfaction.     Hillis Range

## 2011-08-17 ENCOUNTER — Ambulatory Visit (HOSPITAL_COMMUNITY): Payer: Medicare Other

## 2011-08-17 NOTE — Discharge Instructions (Addendum)
   Supplemental Discharge Instructions for  Pacemaker/Defibrillator Patients  Activity No heavy lifting or vigorous activity with your left/right arm for 6 to 8 weeks.  Do not raise your left/right arm above your head for one week.  Gradually raise your affected arm as drawn below.           05/09                      05/10                        05/11                     05/12       NO DRIVING for 1week; you may begin driving on 16/10/96. WOUND CARE   Keep the wound area clean and dry.  Do not get this area wet for one week. No showers for one week; you may shower on 08/24/11.   The tape/steri-strips on your wound will fall off; do not pull them off.  No bandage is needed on the site.  DO  NOT apply any creams, oils, or ointments to the wound area.   If you notice any drainage or discharge from the wound, any swelling or bruising at the site, or you develop a fever > 101? F after you are discharged home, call the office at once.  Special Instructions   You are still able to use cellular telephones; use the ear opposite the side where you have your pacemaker/defibrillator.  Avoid carrying your cellular phone near your device.   When traveling through airports, show security personnel your identification card to avoid being screened in the metal detectors.  Ask the security personnel to use the hand wand.   Avoid arc welding equipment, MRI testing (magnetic resonance imaging), TENS units (transcutaneous nerve stimulators).  Call the office for questions about other devices.   Avoid electrical appliances that are in poor condition or are not properly grounded.   Microwave ovens are safe to be near or to operate.  Additional information for defibrillator patients should your device go off:   If your device goes off ONCE and you feel fine afterward, notify the device clinic nurses.   If your device goes off ONCE and you do not feel well afterward, call 911.   If your device goes off TWICE,  call 911.   If your device goes off THREE times in one day, call 911.  DO NOT DRIVE YOURSELF OR A FAMILY MEMBER WITH A DEFIBRILLATOR TO THE HOSPITAL--CALL 911.

## 2011-08-17 NOTE — Progress Notes (Signed)
Pt refused to swear arm sling all night. Pt instructed not to move his left arm for any reason pt stated "i understand". Will continue to monitor.

## 2011-08-17 NOTE — Discharge Summary (Signed)
ELECTROPHYSIOLOGY PROCEDURE DISCHARGE SUMMARY    Patient ID: [name],  MRN: [id number], DOB/AGE: 69-Oct-1942 69 y.o.  Admit date: 08/16/2011 Discharge date: 08/17/2011  Primary Care Physician: Raliegh Ip, MD, MD Primary Cardiologist: Thurmon Fair, MD  Primary Discharge Diagnosis:  1.  Nonischemic cardiomyopathy s/p bi-V ICD implantation  Secondary Discharge Diagnosis:  1.  Chronic systolic CHF 2.  Valvular heart disease s/p AVR x2 3.  CAD s/p CABG 4.  LBBB  Procedures This Admission:  1.  BI-VENTRICULAR IMPLANTABLE CARDIOVERTER DEFIBRILLATOR  (CRT-D) - please see full dictated operative report for full details (atrial lead - [employer] [place], model 9147WG-95, serial # [id number]); RV defibrillator lead - [employer] [place], model 8469G-29, serial number [id number]; LV lead -  [employer] Quadrapolar lead model [id number] - 86, serial number [id number])   Brief HPI:  [name] is a 71 year old male with a (EF 25%), NYHA Class III CHF, CAD, and LBBB QRS morophology. At this time, he meets MADIT II/ SCD-HeFT criteria for ICD implantation for primary prevention of sudden death. Given LBBB, the patient may also be expected to benefit from resynchronization therapy. The patient has been treated with an optimal medical regimen but continues to have a depressed ejection fraction and NYHA Class III CHF symptoms. He presented 08/16/11 for biventricular ICD implantation.   Hospital Course:  [name] presented 08/16/11 for bi-V ICD implantation. After informed consent was obtained, he underwent bi-V ICD implantation performed by Dr. Hillis Range. Please see operative note for full details. [name] remained overnight with no immediate complications. He remains hemodynamically stable. His implant site is intact with no hematoma. His chest x-ray shows Normal lead positioning . His device interrogation shows normal bi-V ICD function with stable lead parameters. He has  been provided wound care and discharge instructions. He has been seen, examined and deemed stable for discharge home today by Dr. Sharrell Ku.  Discharge Vitals: Blood pressure 120/61, pulse 62, temperature 98 F (36.7 C), temperature source Oral, resp. rate 18, height 6' 0.5" (1.842 m), weight 241 lb 2.9 oz (109.4 kg), SpO2 98.00%.   Labs: Lab Results  Component Value Date   WBC 11.0* 08/12/2011   HGB 10.9* 08/12/2011   HCT 32.9* 08/12/2011   MCV 96.2 08/12/2011   PLT 266.0 08/12/2011     Lab 08/16/11 0637  NA 136  K 4.6  CL 103  CO2 22  BUN 32*  CREATININE 1.41*  CALCIUM 9.3  PROT --  BILITOT --  ALKPHOS --  ALT --  AST --  GLUCOSE 104*    Discharge Medications:  Medication List  As of 08/17/2011  8:03 AM   TAKE these medications         allopurinol 300 MG tablet   Commonly known as: ZYLOPRIM   Take 300 mg by mouth daily.      atorvastatin 20 MG tablet   Commonly known as: LIPITOR   Take 20 mg by mouth daily.      carvedilol 12.5 MG tablet   Commonly known as: COREG   Take 12.5 mg by mouth 2 (two) times daily with a meal.      clopidogrel 75 MG tablet   Commonly known as: PLAVIX   Take 75 mg by mouth daily.      COPPER CAPS PO   Take 1 capsule by mouth daily.      enalapril 10 MG tablet   Commonly known as: VASOTEC  Take 1 tablet (10 mg total) by mouth 2 (two) times daily.      ferrous gluconate 240 (27 FE) MG tablet   Commonly known as: FERGON   Take 240 mg by mouth daily.      FISH OIL PO   Take 1 capsule by mouth daily. 1200mg       folic acid 800 MCG tablet   Commonly known as: FOLVITE   Take 800 mcg by mouth daily.      furosemide 40 MG tablet   Commonly known as: LASIX   Take 80 mg by mouth daily.      spironolactone 25 MG tablet   Commonly known as: ALDACTONE   Take 25 mg by mouth daily.      vitamin B-12 1000 MCG tablet   Commonly known as: CYANOCOBALAMIN   Take 1,000 mcg by mouth daily.           Disposition:  Follow-up Information     Follow up with [facility] On 08/30/2011 at 10:30 AM (for wound check)    Contact information:   [address]  [address] [place] [address]    Follow up with Hillis Range, MD. On 12/08/2011 at 9:30 AM   Contact information:   [address], [address] [place] [address] [phone]     Duration of Discharge Encounter: Greater than 30 minutes including physician time.  Signed, EDMISTEN, Herby Abraham., PA-C 08/17/2011, 8:03 AM  EP Attending  Patient seen and examined. Chest Xray reviewed. OK for discharge with followup as above.  Lewayne Bunting, M.D.

## 2011-08-20 ENCOUNTER — Encounter: Payer: Self-pay | Admitting: *Deleted

## 2011-08-20 DIAGNOSIS — Z95810 Presence of automatic (implantable) cardiac defibrillator: Secondary | ICD-10-CM | POA: Insufficient documentation

## 2011-08-30 ENCOUNTER — Ambulatory Visit (INDEPENDENT_AMBULATORY_CARE_PROVIDER_SITE_OTHER): Payer: Medicare Other | Admitting: *Deleted

## 2011-08-30 ENCOUNTER — Encounter: Payer: Self-pay | Admitting: Internal Medicine

## 2011-08-30 DIAGNOSIS — I428 Other cardiomyopathies: Secondary | ICD-10-CM

## 2011-08-30 DIAGNOSIS — I5022 Chronic systolic (congestive) heart failure: Secondary | ICD-10-CM

## 2011-08-30 LAB — ICD DEVICE OBSERVATION
AL AMPLITUDE: 3.2 mv
AL THRESHOLD: 0.75 V
ATRIAL PACING ICD: 37 pct
DEVICE MODEL ICD: 7037237
FVT: 0
HV IMPEDENCE: 57 Ohm
PACEART VT: 0
RV LEAD AMPLITUDE: 11.9 mv
RV LEAD IMPEDENCE ICD: 475 Ohm
TOT-0009: 1
TOT-0010: 1
TZON-0005SLOWVT: 6
VF: 0

## 2011-08-30 NOTE — Progress Notes (Signed)
Wound check-ICD 

## 2011-09-01 ENCOUNTER — Encounter: Payer: Self-pay | Admitting: Internal Medicine

## 2011-09-14 ENCOUNTER — Telehealth: Payer: Self-pay | Admitting: Internal Medicine

## 2011-09-14 NOTE — Telephone Encounter (Signed)
Will see the patient on 09/16/11 at 9:00  Dr C aware and Efraim Kaufmann will call the patient

## 2011-09-14 NOTE — Telephone Encounter (Signed)
Pt had procedure 08-16-11, pt having dizzy spells in the last couple weeks, but also had them prior to the procedure, pt having fatigue and sweating spells

## 2011-09-16 ENCOUNTER — Encounter: Payer: Self-pay | Admitting: Internal Medicine

## 2011-09-16 ENCOUNTER — Ambulatory Visit (INDEPENDENT_AMBULATORY_CARE_PROVIDER_SITE_OTHER): Payer: Medicare Other | Admitting: Internal Medicine

## 2011-09-16 VITALS — BP 122/62 | HR 80 | Ht 72.5 in | Wt 242.8 lb

## 2011-09-16 DIAGNOSIS — I509 Heart failure, unspecified: Secondary | ICD-10-CM

## 2011-09-16 DIAGNOSIS — I5022 Chronic systolic (congestive) heart failure: Secondary | ICD-10-CM

## 2011-09-16 LAB — ICD DEVICE OBSERVATION
AL IMPEDENCE ICD: 440 Ohm
ATRIAL PACING ICD: 29 pct
BAMS-0001: 180 {beats}/min
BAMS-0003: 70 {beats}/min
DEVICE MODEL ICD: 7037237
LV LEAD IMPEDENCE ICD: 850 Ohm
RV LEAD IMPEDENCE ICD: 390 Ohm
RV LEAD THRESHOLD: 0.875 V
TZON-0003SLOWVT: 340 ms
TZON-0005SLOWVT: 6
VENTRICULAR PACING ICD: 98 pct

## 2011-09-16 MED ORDER — CARVEDILOL 6.25 MG PO TABS: 6.2500 mg | ORAL_TABLET | Freq: Two times a day (BID) | ORAL | Status: DC

## 2011-09-16 NOTE — Assessment & Plan Note (Signed)
Doing well s/p CRT-D Normal BiV ICD function See Pace Art report  I have adjusted LV/RV timing today as recommended by quick opt with LV before RV by 10 msec.  I have spoken with Dr Royann Shivers this am.  Given fatigue, we will decrease coreg to 6.25mg  BID today. He will follow-up with Dr C in 4-6 weeks.  I will see him as needed going forward.

## 2011-09-16 NOTE — Progress Notes (Signed)
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 male who presents today for routine electrophysiology followup.  Since having his BiV ICD implanted, the patient reports doing well. He denies procedure related complications.  He continues to have fatigue and poor stamina.  He has occasional dizziness.  Today, he denies symptoms of palpitations, chest pain, shortness of breath,  lower extremity edema,   presyncope, syncope, or ICD shocks.  The patient is otherwise without complaint today.   Past Medical History  Diagnosis Date  . Nonischemic cardiomyopathy     EF 25-30%  . Chronic systolic dysfunction of left ventricle   . Valvular cardiomyopathy   . Aortic valve replaced     x2  . CAD (coronary artery disease)     s/p CABG  . LBBB (left bundle branch block)     QRS  . ICD (implantable cardiac defibrillator) in place   . CHF (congestive heart failure)   . Shortness of breath   . Hypertension    Past Surgical History  Procedure Date  . Aortic valve replacement     s/p 2 separate AVR procedures, an initial mechanical AVR was replaced with a Medtronic Freestyle root in 2009 with reimplantation of the cors as well as redo SVG to RCA and preservation of LIMA to LAD  . Coronary artery bypass graft   . Biv icd implant 08/16/11    SJM BiV ICD implanted by Dr Johney Frame  . Cardiac valve replacement     Current Outpatient Prescriptions  Medication Sig Dispense Refill  . allopurinol (ZYLOPRIM) 300 MG tablet Take 300 mg by mouth daily.        Marland Kitchen atorvastatin (LIPITOR) 20 MG tablet Take 20 mg by mouth daily.        . carvedilol (COREG) 12.5 MG tablet Take 12.5 mg by mouth 2 (two) times daily with a meal.        . clopidogrel (PLAVIX) 75 MG tablet Take 75 mg by mouth daily.        . Copper Gluconate (COPPER CAPS PO) Take 1 capsule by mouth daily.        . enalapril (VASOTEC) 10 MG tablet Take 1 tablet (10 mg total) by mouth 2 (two) times daily.   60 tablet  11  . folic acid (FOLVITE) 800 MCG tablet Take 800 mcg by mouth daily.       . furosemide (LASIX) 40 MG tablet Take 80 mg by mouth daily.        . Omega-3 Fatty Acids (FISH OIL PO) Take 1 capsule by mouth daily. 1200mg       . spironolactone (ALDACTONE) 25 MG tablet Take 25 mg by mouth daily.        . vitamin B-12 (CYANOCOBALAMIN) 1000 MCG tablet Take 1,000 mcg by mouth daily.          Physical Exam: Filed Vitals:   09/16/11 0915  BP: 122/62  Pulse: 80  Height: 6' 0.5" (1.842 m)  Weight: 242 lb 12.8 oz (110.133 kg)    GEN- The patient is well appearing, alert and oriented x 3 today.   Head- normocephalic, atraumatic Eyes-  Sclera clear, conjunctiva pink Ears- hearing intact Oropharynx- clear Lungs- Clear to ausculation bilaterally, normal work of breathing Chest- ICD pocket is well healed Heart- Regular rate and rhythm, no murmurs, rubs or gallops, PMI not laterally displaced GI- soft, NT, ND, + BS Extremities- no clubbing, cyanosis, or edema  ICD interrogation- reviewed in detail today,  See PACEART report  Assessment and Plan:

## 2011-09-16 NOTE — Patient Instructions (Signed)
Your physician recommends that you schedule a follow-up appointment with Dr. Johney Frame as needed.  Your physician recommends that you schedule a follow-up appointment with Dr. Rubie Maid at [facility] in 4-6 weeks.  Decrease Coreg to 6.25mg  twice daily.

## 2011-10-05 ENCOUNTER — Telehealth: Payer: Self-pay | Admitting: Internal Medicine

## 2011-10-05 NOTE — Telephone Encounter (Signed)
New problem:  Patient wife calling has some general question regarding his defib.

## 2011-10-06 NOTE — Telephone Encounter (Signed)
lmom for pt's wife to return call.

## 2011-10-07 NOTE — Telephone Encounter (Signed)
Returned call with no answer/kwb

## 2011-10-08 NOTE — Telephone Encounter (Signed)
Left message return cal to pt's wife/kwb

## 2011-12-08 ENCOUNTER — Ambulatory Visit (INDEPENDENT_AMBULATORY_CARE_PROVIDER_SITE_OTHER): Payer: Medicare Other | Admitting: Internal Medicine

## 2011-12-08 ENCOUNTER — Encounter: Payer: Self-pay | Admitting: Internal Medicine

## 2011-12-08 VITALS — BP 118/62 | HR 78 | Resp 18 | Ht 72.0 in | Wt 245.0 lb

## 2011-12-08 DIAGNOSIS — Z95810 Presence of automatic (implantable) cardiac defibrillator: Secondary | ICD-10-CM

## 2011-12-08 DIAGNOSIS — I2589 Other forms of chronic ischemic heart disease: Secondary | ICD-10-CM

## 2011-12-08 DIAGNOSIS — I2510 Atherosclerotic heart disease of native coronary artery without angina pectoris: Secondary | ICD-10-CM

## 2011-12-08 DIAGNOSIS — I509 Heart failure, unspecified: Secondary | ICD-10-CM

## 2011-12-08 DIAGNOSIS — I5022 Chronic systolic (congestive) heart failure: Secondary | ICD-10-CM

## 2011-12-08 LAB — ICD DEVICE OBSERVATION
AL AMPLITUDE: 4.3 mv
AL IMPEDENCE ICD: 425 Ohm
BAMS-0003: 70 {beats}/min
DEVICE MODEL ICD: 7037237
HV IMPEDENCE: 59 Ohm
LV LEAD IMPEDENCE ICD: 687.5 Ohm
LV LEAD THRESHOLD: 2.125 V
RV LEAD IMPEDENCE ICD: 350 Ohm
TOT-0006: 20130506000000
TOT-0007: 1
TOT-0008: 0
TOT-0010: 1
TZON-0003SLOWVT: 340 ms
TZON-0004SLOWVT: 24
TZON-0010SLOWVT: 40 ms
VF: 0

## 2011-12-08 NOTE — Patient Instructions (Signed)
Your physician recommends that you schedule a follow-up appointment as needed  

## 2011-12-13 NOTE — Progress Notes (Signed)
PCP:  Raliegh Ip, MD Primary Cardiologist:  Dr Royann Shivers  The patient presents today for routine electrophysiology followup.  Since having his BiV ICD implanted, the patient reports doing well.  He denies procedure related complications.  He continues to have fatigue.  He reports SOB with moderate activity.  He is not certain that he has received significant benefit with CRT to this point.  Today, he denies symptoms of palpitations, chest pain, worsening lower extremity edema, dizziness, presyncope, syncope, or neurologic sequela.  The patient feels that he is tolerating medications without difficulties and is otherwise without complaint today.   Past Medical History  Diagnosis Date  . Nonischemic cardiomyopathy     EF 25-30%  . Chronic systolic dysfunction of left ventricle   . Valvular cardiomyopathy   . Aortic valve replaced     x2  . CAD (coronary artery disease)     s/p CABG  . LBBB (left bundle branch block)     QRS  . ICD (implantable cardiac defibrillator) in place   . CHF (congestive heart failure)   . Shortness of breath   . Hypertension    Past Surgical History  Procedure Date  . Aortic valve replacement     s/p 2 separate AVR procedures, an initial mechanical AVR was replaced with a Medtronic Freestyle root in 2009 with reimplantation of the cors as well as redo SVG to RCA and preservation of LIMA to LAD  . Coronary artery bypass graft   . Biv icd implant 08/16/11    SJM BiV ICD implanted by Dr Johney Frame  . Cardiac valve replacement     Current Outpatient Prescriptions  Medication Sig Dispense Refill  . allopurinol (ZYLOPRIM) 300 MG tablet Take 300 mg by mouth daily.        Marland Kitchen atorvastatin (LIPITOR) 20 MG tablet Take 20 mg by mouth daily.        . carvedilol (COREG) 6.25 MG tablet Take 1 tablet (6.25 mg total) by mouth 2 (two) times daily with a meal.  120 tablet  3  . clopidogrel (PLAVIX) 75 MG tablet Take 75 mg by mouth daily.        . Copper  Gluconate (COPPER CAPS PO) Take 1 capsule by mouth daily.        . enalapril (VASOTEC) 10 MG tablet Take 1 tablet (10 mg total) by mouth 2 (two) times daily.  60 tablet  11  . folic acid (FOLVITE) 800 MCG tablet Take 800 mcg by mouth daily.       . furosemide (LASIX) 40 MG tablet Take 80 mg by mouth daily.        . Omega-3 Fatty Acids (FISH OIL PO) Take 1 capsule by mouth daily. 1200mg       . spironolactone (ALDACTONE) 25 MG tablet Take 25 mg by mouth daily.        . vitamin B-12 (CYANOCOBALAMIN) 1000 MCG tablet Take 1,000 mcg by mouth daily.          No Known Allergies  History   Social History  . Marital Status: Married    Spouse Name: N/A    Number of Children: N/A  . Years of Education: N/A   Occupational History  . Not on file.   Social History Main Topics  . Smoking status: Former Smoker    Types: Cigarettes    Quit date: 04/12/1992  . Smokeless tobacco: Never Used  . Alcohol Use: No  . Drug Use: No  .  Sexually Active: Not on file   Other Topics Concern  . Not on file   Social History Narrative  . No narrative on file   Physical Exam: Filed Vitals:   12/08/11 0951  BP: 118/62  Pulse: 78  Resp: 18  Height: 6' (1.829 m)  Weight: 245 lb (111.131 kg)  SpO2: 95%    GEN- The patient is well appearing, alert and oriented x 3 today.   Head- normocephalic, atraumatic Eyes-  Sclera clear, conjunctiva pink Ears- hearing intact Oropharynx- clear Lungs- Clear to ausculation bilaterally, normal work of breathing Chest- ICD pocket is well healed Heart- Regular rate and rhythm, no murmurs, rubs or gallops, PMI not laterally displaced GI- soft, NT, ND, + BS Extremities- no clubbing, cyanosis, trace edema MS- no significant deformity or atrophy  ICD interrogation- reviewed in detail today,  See PACEART report ekg today reveals sinus rhythm with BiV pacing (QRS )  Assessment and Plan:

## 2011-12-13 NOTE — Assessment & Plan Note (Signed)
Doing well s/p CRT-D implant by me. He continues to have some fatigue and exercise limitations. Normal BiV ICD function See Pace Art report  Quick opt recommends no changes today.  He has echo optimization planned with Dr Royann Shivers in the coming weeks.  I have therefore made no changes today. He will follow closely with Dr Royann Shivers and I will see him as needed going forward.

## 2012-07-10 ENCOUNTER — Other Ambulatory Visit (HOSPITAL_COMMUNITY): Payer: Self-pay | Admitting: Cardiovascular Disease

## 2012-07-10 DIAGNOSIS — I350 Nonrheumatic aortic (valve) stenosis: Secondary | ICD-10-CM

## 2012-07-10 DIAGNOSIS — I509 Heart failure, unspecified: Secondary | ICD-10-CM

## 2012-07-10 DIAGNOSIS — R0602 Shortness of breath: Secondary | ICD-10-CM

## 2012-07-12 ENCOUNTER — Encounter: Payer: Self-pay | Admitting: *Deleted

## 2012-07-13 ENCOUNTER — Ambulatory Visit (HOSPITAL_COMMUNITY)
Admission: RE | Admit: 2012-07-13 | Discharge: 2012-07-13 | Disposition: A | Discharge status: Home / Self Care | Payer: Medicare Other | Source: Ambulatory Visit | Attending: Cardiovascular Disease | Admitting: Cardiovascular Disease

## 2012-07-13 ENCOUNTER — Encounter: Payer: Self-pay | Admitting: Cardiovascular Disease

## 2012-07-13 DIAGNOSIS — I509 Heart failure, unspecified: Secondary | ICD-10-CM | POA: Insufficient documentation

## 2012-07-13 DIAGNOSIS — I359 Nonrheumatic aortic valve disorder, unspecified: Secondary | ICD-10-CM | POA: Insufficient documentation

## 2012-07-13 DIAGNOSIS — R0602 Shortness of breath: Secondary | ICD-10-CM | POA: Insufficient documentation

## 2012-07-13 DIAGNOSIS — I350 Nonrheumatic aortic (valve) stenosis: Secondary | ICD-10-CM

## 2012-07-13 NOTE — Progress Notes (Signed)
[facility]   2D echo completed 07/13/2012.   Cindy Monica Zahler, RDCS  

## 2012-07-20 ENCOUNTER — Other Ambulatory Visit: Payer: Self-pay | Admitting: *Deleted

## 2012-07-20 MED ORDER — CARVEDILOL 6.25 MG PO TABS: 6.2500 mg | ORAL_TABLET | Freq: Two times a day (BID) | ORAL | Status: DC

## 2012-08-05 LAB — PACEMAKER DEVICE OBSERVATION

## 2012-08-07 ENCOUNTER — Other Ambulatory Visit: Payer: Self-pay | Admitting: Cardiovascular Disease

## 2012-08-28 LAB — REMOTE ICD DEVICE
AL IMPEDENCE ICD: 440 Ohm
AL THRESHOLD: 0.625 V
BAMS-0001: 180 {beats}/min
BAMS-0003: 70 {beats}/min
CHARGE TIME: 9.1 s
HV IMPEDENCE: 73 Ohm
LV LEAD THRESHOLD: 1.25 V
MODE SWITCH EPISODES: 0
PACEART VT: 0
RV LEAD AMPLITUDE: 11.9 mv
TOT-0001: 0
TOT-0002: 0
TZON-0005SLOWVT: 6
VENTRICULAR PACING ICD: 95 pct
VF: 0

## 2012-09-01 ENCOUNTER — Telehealth: Payer: Self-pay | Admitting: Cardiovascular Disease

## 2012-09-01 MED ORDER — FUROSEMIDE 40 MG PO TABS: 80.0000 mg | ORAL_TABLET | Freq: Two times a day (BID) | ORAL | Status: DC

## 2012-09-01 NOTE — Telephone Encounter (Signed)
Returned call.  Spoke w/ pt's wife, [name].  Stated they received a call about an appt on the 27th and that it will be over the phone.  Stated they didn't know anything about that.  Asked if pt is having problems breathing.  Stated pt has been complaining of "not being able to get a good breath."  Also c/o abd swelling x "months."  Stated the breathing has gotten worse over the past couple of months.  Stated she talked to Dr. Salena Saner about getting the fluid off of his stomach before.  Stated pt c/o "his heart hurts" occasionally.    Message forwarded to Dr. Royann Shivers. Forwarded to B. Lassiter, CMA.

## 2012-09-01 NOTE — Telephone Encounter (Signed)
Having problems breathing-thinks he needs something for it-Please call!

## 2012-09-02 DIAGNOSIS — I428 Other cardiomyopathies: Secondary | ICD-10-CM

## 2012-09-02 LAB — ICD DEVICE OBSERVATION

## 2012-09-08 ENCOUNTER — Encounter: Payer: Self-pay | Admitting: Cardiology

## 2012-09-08 ENCOUNTER — Ambulatory Visit (INDEPENDENT_AMBULATORY_CARE_PROVIDER_SITE_OTHER): Payer: Medicare Other | Admitting: Cardiology

## 2012-09-08 VITALS — BP 104/72 | HR 90 | Ht 72.5 in | Wt 247.0 lb

## 2012-09-08 DIAGNOSIS — I5023 Acute on chronic systolic (congestive) heart failure: Secondary | ICD-10-CM

## 2012-09-08 DIAGNOSIS — Z954 Presence of other heart-valve replacement: Secondary | ICD-10-CM

## 2012-09-08 DIAGNOSIS — Z952 Presence of prosthetic heart valve: Secondary | ICD-10-CM

## 2012-09-08 DIAGNOSIS — I2510 Atherosclerotic heart disease of native coronary artery without angina pectoris: Secondary | ICD-10-CM

## 2012-09-08 DIAGNOSIS — I509 Heart failure, unspecified: Secondary | ICD-10-CM

## 2012-09-08 LAB — CBC WITH DIFFERENTIAL/PLATELET
Eosinophils Absolute: 0.2 10*3/uL (ref 0.0–0.7)
Eosinophils Relative: 2 % (ref 0–5)
HCT: 38.2 % — ABNORMAL LOW (ref 39.0–52.0)
Hemoglobin: 12.8 g/dL — ABNORMAL LOW (ref 13.0–17.0)
Lymphocytes Relative: 14 % (ref 12–46)
Lymphs Abs: 1.5 10*3/uL (ref 0.7–4.0)
MCH: 30.9 pg (ref 26.0–34.0)
MCV: 92.3 fL (ref 78.0–100.0)
Monocytes Absolute: 0.6 10*3/uL (ref 0.1–1.0)
Monocytes Relative: 5 % (ref 3–12)
RBC: 4.14 MIL/uL — ABNORMAL LOW (ref 4.22–5.81)
WBC: 10.9 10*3/uL — ABNORMAL HIGH (ref 4.0–10.5)

## 2012-09-08 LAB — BASIC METABOLIC PANEL WITH GFR
BUN: 32 mg/dL — ABNORMAL HIGH (ref 6–23)
CO2: 23 mEq/L (ref 19–32)
GFR, Est African American: 47 mL/min — ABNORMAL LOW
Glucose, Bld: 119 mg/dL — ABNORMAL HIGH (ref 70–99)
Potassium: 5.7 mEq/L — ABNORMAL HIGH (ref 3.5–5.3)
Sodium: 134 mEq/L — ABNORMAL LOW (ref 135–145)

## 2012-09-08 MED ORDER — FUROSEMIDE 40 MG PO TABS: 80.0000 mg | ORAL_TABLET | Freq: Every day | ORAL | Status: DC

## 2012-09-08 NOTE — Patient Instructions (Signed)
Take 80 mg of Lasix, daily for 3 days- Sat Sunday and Monday.  Then decrease to 60 mg daily.   Have blood work done today.  Call if weight increases or symptoms do not improve.  Follow up next week with me or Dr. Jeneen Montgomery.

## 2012-09-08 NOTE — Progress Notes (Signed)
[facility]  09/08/2012   PCP: Raliegh Ip, MD   Chief Complaint  Patient presents with  . Shortness of Breath    sob has worsened recently, abd distention    Primary Cardiologist: Dr. Royann Shivers    HPI: 72 year old WMM for increasing shortness of breath at rest.   He has undergone 2 previous open heart surgery procedures. In 2000 he underwent an initial aortic valve replacement and coronary bypass surgery. 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 He has severe ischemic cardiomyopathy with an ejection fraction of 25% to 30%. A dual-chamber biventricular defibrillator (CRT-D) was implanted in May 2013 but he has really not shown signs of good response to CRT. He has frequent PVCs which appear to interfere with the automatic adjustment of AV delays and we performed an AV optimization a few months ago. He continues to have New York Heart Association functional class II-III status. He denies edema, +orthopnea but no paroxysmal nocturnal dyspnea, and has not really had a lot of problems with dizziness or lightheadedness. He denies any palpitations or syncope and he has not had any defibrillator discharges. He has not had any chest pain either at rest or with exertion.  He has been more short of breath he feels like he breathes but there is no oxygen in the [place] breathing. He has had some cough nonproductive. He does not sleep well anyway but this has not awakened him from sleep. His weight has not changed from his previous visit 247 pounds today previously he was 248.     Allergies  Allergen Reactions  . Ace Inhibitors Cough    Current Outpatient Prescriptions  Medication Sig Dispense Refill  . allopurinol (ZYLOPRIM)  300 MG tablet Take 300 mg by mouth daily.        Marland Kitchen atorvastatin (LIPITOR) 20 MG tablet Take 20 mg by mouth daily.        . carvedilol (COREG) 6.25 MG tablet Take 1 tablet (6.25 mg total) by mouth 2 (two) times daily with a meal.  180 tablet  1  . clopidogrel (PLAVIX) 75 MG tablet Take 75 mg by mouth daily.        . Copper Gluconate (COPPER CAPS PO) Take 1 capsule by mouth daily.        . enalapril (VASOTEC) 10 MG tablet Take 1 tablet (10 mg total) by mouth 2 (two) times daily.  60 tablet  11  . Ferrous Sulfate (IRON) 28 MG TABS Take 28 mg by mouth daily.      . folic acid (FOLVITE) 800 MCG tablet Take 800 mcg by mouth daily.       . furosemide (LASIX) 40 MG tablet Take 40 mg by mouth. 2 tablets daily      . Omega-3 Fatty Acids (FISH OIL PO) Take 1 capsule by mouth daily. 1200mg       . spironolactone (ALDACTONE) 25 MG tablet Take 25 mg by mouth daily.       . vitamin B-12 (CYANOCOBALAMIN) 1000 MCG tablet Take 500 mcg by mouth daily.       . furosemide (LASIX) 40 MG tablet Take 2 tablets (80 mg total) by mouth daily.  90 tablet  3   No current facility-administered medications for this visit.  Past Medical History  Diagnosis Date  . Nonischemic cardiomyopathy     EF 25-30%  . Chronic systolic dysfunction of left ventricle   . Valvular cardiomyopathy   . Aortic valve replaced     x2 2002 & 2009 Medtronic free style aortic root  . CAD (coronary artery disease)     s/p CABG  . LBBB (left bundle branch block)     QRS  . CHF (congestive heart failure)   . Shortness of breath   . Hypertension   . 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 Aortic valve replacement      s/p 2 separate AVR procedures, an initial mechanical AVR was replaced with a Medtronic Freestyle root in 2009 with reimplantation of the cors as well as redo SVG to RCA and preservation of LIMA to LAD  . Coronary  artery bypass graft      2002  . Biv icd implant  08/16/11    SJM BiV ICD implanted by Dr Johney Frame  . Cardiac valve replacement      ZOX:WRUEAVW:+ cough but no colds or fevers, no weight changes Skin:no rashes or ulcers HEENT:no blurred vision, no congestion CV:see HPI PUL:see HPI UJ:WJXB intermittant diarrhea and constipation, also bright blood with stools secondary to hemrrhoids, no indigestion GU:no hematuria, no dysuria MS:no joint pain, no claudication Neuro:no syncope, no lightheadedness Endo:no diabetes, no thyroid disease  PHYSICAL EXAM BP 104/72  Pulse 90  Ht 6' 0.5" (1.842 m)  Wt 247 lb (112.038 kg)  BMI 33.02 kg/m2 General:Pleasant affect, NAD Skin:Warm and dry, brisk capillary refill HEENT:normocephalic, sclera clear, mucus membranes moist Neck:supple, no JVD, no bruits  Heart:S1S2 RRR without murmur, gallup, rub or click Lungs:clear with few rales, no rhonchi, or wheezes JYN:WGNF, non tender, + BS, do not palpate liver spleen or masses, though enlarged Ext:+ lower ext edema in his ankles, 2+post tib 2+ radial pulses Neuro:alert and oriented, MAE, follows commands, + facial symmetry  Pulse Ox at rest 97% and with exertion 96%  EKG:V paced with atrial sensing.  PR of 200 ms. Rate is increased from previous tracing.  Otherwise no acute changes,  ASSESSMENT AND PLAN Acute on chronic systolic CHF (congestive heart failure) Increase Lasix to 80 mg for next 3 days. Then back to 60 mg daily.  Call if no improvement. Will check BMP and Pro BNP if no improvement will check CXR.  CAD (coronary artery disease) No chest pain  ICD-St.Jude Stable   Aortic valve replaced Last Echo in April 2014 no aortic regurg. Mildly to moderately calcified mitral annulus. Mild TR.   Will have him follow up with me or Dr. Royann Shivers next week.

## 2012-09-08 NOTE — Assessment & Plan Note (Signed)
Last Echo in April 2014 no aortic regurg. Mildly to moderately calcified mitral annulus. Mild TR.

## 2012-09-08 NOTE — Assessment & Plan Note (Signed)
Stable

## 2012-09-08 NOTE — Assessment & Plan Note (Signed)
No chest pain

## 2012-09-08 NOTE — Assessment & Plan Note (Addendum)
Increase Lasix to 80 mg for next 3 days. Then back to 60 mg daily.  Call if no improvement. Will check BMP and Pro BNP if no improvement will check CXR.

## 2012-09-09 ENCOUNTER — Telehealth: Payer: Self-pay | Admitting: Cardiology

## 2012-09-09 NOTE — Telephone Encounter (Signed)
Pt's K+ was elevated at 5.7- I called and left message for him to stop his spironolactone and enalapril over the weekend.  He was to have an increased dose of lasix at 80 mg daily for  3 days.  He was instructed to call if he had any questions.  He will need repeat BMP on Monday and I have sent note to clinical pool.

## 2012-09-12 ENCOUNTER — Ambulatory Visit (INDEPENDENT_AMBULATORY_CARE_PROVIDER_SITE_OTHER): Payer: Medicare Other | Admitting: Cardiovascular Disease

## 2012-09-12 ENCOUNTER — Ambulatory Visit
Admission: RE | Admit: 2012-09-12 | Discharge: 2012-09-12 | Disposition: A | Discharge status: Home / Self Care | Payer: Medicare Other | Source: Ambulatory Visit | Attending: Cardiovascular Disease | Admitting: Cardiovascular Disease

## 2012-09-12 ENCOUNTER — Encounter: Payer: Self-pay | Admitting: Cardiovascular Disease

## 2012-09-12 VITALS — BP 130/88 | HR 86 | Ht 73.0 in | Wt 242.6 lb

## 2012-09-12 DIAGNOSIS — Z79899 Other long term (current) drug therapy: Secondary | ICD-10-CM

## 2012-09-12 DIAGNOSIS — Z954 Presence of other heart-valve replacement: Secondary | ICD-10-CM

## 2012-09-12 DIAGNOSIS — R0602 Shortness of breath: Secondary | ICD-10-CM

## 2012-09-12 DIAGNOSIS — D689 Coagulation defect, unspecified: Secondary | ICD-10-CM

## 2012-09-12 DIAGNOSIS — I5023 Acute on chronic systolic (congestive) heart failure: Secondary | ICD-10-CM

## 2012-09-12 DIAGNOSIS — I5022 Chronic systolic (congestive) heart failure: Secondary | ICD-10-CM

## 2012-09-12 DIAGNOSIS — Z95810 Presence of automatic (implantable) cardiac defibrillator: Secondary | ICD-10-CM

## 2012-09-12 DIAGNOSIS — I509 Heart failure, unspecified: Secondary | ICD-10-CM

## 2012-09-12 DIAGNOSIS — R5381 Other malaise: Secondary | ICD-10-CM

## 2012-09-12 DIAGNOSIS — Z01812 Encounter for preprocedural laboratory examination: Secondary | ICD-10-CM

## 2012-09-12 DIAGNOSIS — I2510 Atherosclerotic heart disease of native coronary artery without angina pectoris: Secondary | ICD-10-CM

## 2012-09-12 DIAGNOSIS — Z952 Presence of prosthetic heart valve: Secondary | ICD-10-CM

## 2012-09-12 DIAGNOSIS — R5383 Other fatigue: Secondary | ICD-10-CM

## 2012-09-12 LAB — CBC
HCT: 39.8 % (ref 39.0–52.0)
Platelets: 372 10*3/uL (ref 150–400)
RBC: 4.24 MIL/uL (ref 4.22–5.81)
RDW: 14.6 % (ref 11.5–15.5)
WBC: 11.6 10*3/uL — ABNORMAL HIGH (ref 4.0–10.5)

## 2012-09-12 NOTE — Assessment & Plan Note (Signed)
As post coronary artery reimplantation at time of aneurysm surgery and bypass grafts. There was no evidence of any territories of coronary insufficiency at the time of his cardiac catheterization in January 2013

## 2012-09-12 NOTE — Assessment & Plan Note (Signed)
It remains to be established whether his current complaints of dyspnea are indeed related to heart failure or possibly a noncardiac diagnosis.

## 2012-09-12 NOTE — Patient Instructions (Addendum)
Your physician has requested that you have a cardiac catheterization. Cardiac catheterization is used to diagnose and/or treat various heart conditions. Doctors may recommend this procedure for a number of different reasons. The most common reason is to evaluate chest pain. Chest pain can be a symptom of coronary artery disease (CAD), and cardiac catheterization can show whether plaque is narrowing or blocking your heart's arteries. This procedure is also used to evaluate the valves, as well as measure the blood flow and oxygen levels in different parts of your heart. Your physician recommends that you return for lab work in today. For your procedure you are also instructed to have a chest x-ray. A chest x-ray takes a picture of the organs and structures inside the chest, including the heart, lungs, and blood vessels. This test can show several things, including, whether the heart is enlarges; whether fluid is building up in the lungs; and whether pacemaker / defibrillator leads are still in place. Please hold your Furosemide the morning of the procedure.

## 2012-09-12 NOTE — Assessment & Plan Note (Signed)
Normal device function by recent check. No significant tachyarrhythmias.

## 2012-09-12 NOTE — Progress Notes (Signed)
Patient ID: [name], male   DOB: 02-Apr-1941, 72 y.o.   MRN: [id number]   Reason for office visit For dyspnea, congestive heart failure, valvular heart disease, coronary artery disease.  [name] continues to be troubled by severe shortness of breath (new Heart Association functional class III. This has not improved despite intense diuretic therapy. He has severe cardiomyopathy with a left ventricular ejection fraction of 25-30% and a chronic left bundle branch block. He has not responded to cardiac resynchronization therapy (dual-chamber BiVICD  [employer] Jude implanted May 2013), even after attempts at optimization using echocardiography.  He has had 2 previous open heart surgeries. 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 At the second surgery a Medtronic Freestyle aortic root was placed, the coronaries were reimplanted and the new SVG to the PDA and sequentially to the PLA was placed. Preoperatively left ventricular ejection fraction was normal. Postoperatively left ventricular ejection fraction was 25-30% and has remained poor despite treatment with ACE inhibitors and beta blockers and Aldactone.  He was seen just a week ago in our office. His diuretics were increased. Followup laboratory testing shows an elevated potassium of 5.7 and a worsening creatinine from 1.2-1.6. He has been told to hold his enalapril and Aldactone until today's evaluation. Of note his older charts and records a history of cough with ACE inhibitors, and for this reason he was treated for a long time with Diovan. At some point around the time of his last heart surgery he was switched to enalapril and has taken this medicine for years.  His chest x-ray does not show signs of pulmonary edema. By echocardiography there were no clear signs of elevated filling pressures. His Corvue thoracic impedance monitoring  does not suggest volume overload. Despite his severely depressed left ventricle systolic function all of these findings raise concern that his dyspnea may be noncardiac  Allergies  Allergen Reactions  . Ace Inhibitors Cough    Current Outpatient Prescriptions  Medication Sig Dispense Refill  . allopurinol (ZYLOPRIM) 300 MG tablet Take 300 mg by mouth daily.        Marland Kitchen atorvastatin (LIPITOR) 20 MG tablet Take 20 mg by mouth daily.        . carvedilol (COREG) 6.25 MG tablet Take 1 tablet (6.25 mg total) by mouth 2 (two) times daily with a meal.  180 tablet  1  . clopidogrel (PLAVIX) 75 MG tablet Take 75 mg by mouth daily.        . Copper Gluconate (COPPER CAPS PO) Take 1 capsule by mouth daily.        . Ferrous Sulfate (IRON) 28 MG TABS Take 28 mg by mouth daily.      . folic acid (FOLVITE) 800 MCG tablet Take 800 mcg by mouth daily.       . furosemide (LASIX) 40 MG tablet Take 40 mg by mouth. 2 tablets daily      . furosemide (LASIX) 40 MG tablet Take 2 tablets (80 mg total) by mouth daily.  90 tablet  3  . Omega-3 Fatty Acids (FISH OIL PO) Take 1 capsule by mouth daily. 1200mg       . vitamin B-12 (CYANOCOBALAMIN) 1000 MCG tablet Take 500 mcg by mouth daily.       . enalapril (VASOTEC) 10 MG tablet Take 1 tablet (10 mg total) by mouth 2 (two) times daily.  60 tablet  11  . spironolactone (ALDACTONE) 25 MG tablet Take 25 mg by mouth daily.        No current facility-administered medications for this visit.    Past Medical History  Diagnosis Date  . Nonischemic cardiomyopathy     EF 25-30%  . Chronic systolic dysfunction of left ventricle   . Valvular cardiomyopathy   . Aortic valve replaced     x2 2002 & 2009 Medtronic free style aortic root  . CAD (coronary artery disease)     s/p CABG  . LBBB (left bundle branch block)     QRS  . CHF (congestive heart failure)   . Shortness of breath   . Hypertension   . Biventricular ICD (implantable cardioverter-defibrillator) in  place 08/2011    st jude    Past Surgical History  Procedure Laterality Date  . Aortic valve replacement      s/p 2 separate AVR procedures, an initial mechanical AVR was replaced with a Medtronic Freestyle root in 2009 with reimplantation of the cors as well as redo SVG to RCA and preservation of LIMA to LAD  . Coronary artery bypass graft      2002  . Biv icd implant  08/16/11    SJM BiV ICD implanted by Dr Johney Frame  . Cardiac valve replacement      Family History  Problem Relation Age of Onset  . Diabetes Mother   . Hypertension Mother   . Heart attack Father   . Heart attack Brother   . Diabetes Brother   . Cancer Sister   . Diabetes Sister   . Hypertension Sister     History   Social History  . Marital Status: Married    Spouse Name: N/A    Number of Children: N/A  . Years of Education: N/A   Occupational History  . Not on file.   Social History Main Topics  . Smoking status: Former Smoker    Types: Cigarettes    Quit date: 04/12/1992  . Smokeless tobacco: Never Used  . Alcohol Use: No  . Drug Use: No  . Sexually Active: Not on file   Other Topics Concern  . Not on file   Social History Narrative  . No narrative on file    Review of systems: The patient specifically denies any chest pain at rest or exertion, orthopnea, paroxysmal nocturnal dyspnea, syncope, palpitations, focal neurological deficits, intermittent claudication, lower extremity edema, unexplained weight gain, hemoptysis or wheezing. He states he has been hoarse for years and has been having a dry cough for some amount of time. He seems to be a correlation between switching from ARB to ACE inhibitor and the onset of these symptoms. He denies abdominal pain gastrointestinal bleeding probably or polydipsia frequency urgency nausea or cold symptoms of depression mood swings or hair changes, fever, chills.   PHYSICAL EXAM BP 130/88  Pulse 86  Ht 6\' 1"  (1.854 m)  Wt 242 lb 9.6 oz (110.043 kg)   BMI 32.01 kg/m2 General appearance: alert, cooperative, appears stated age and no distress Neck: no adenopathy, no carotid bruit, no JVD, supple, symmetrical, trachea midline and thyroid not enlarged, symmetric, no tenderness/mass/nodules Lungs: clear to auscultation bilaterally and Well-healed sternotomy scar Heart: regular rate and rhythm, S1, S2 normal, no murmur, click, rub or gallop Abdomen: soft, non-tender; bowel sounds normal; no masses,  no organomegaly Extremities: extremities normal, atraumatic, no cyanosis or edema Pulses: 2+ and symmetric Skin: Skin color, texture, turgor normal. No rashes  or lesions Neurologic: Grossly normal   EKG: Atrial sensed biventricular paced   BMET    Component Value Date/Time   NA 134* 09/08/2012 1545   K 5.7* 09/08/2012 1545   CL 100 09/08/2012 1545   CO2 23 09/08/2012 1545   GLUCOSE 119* 09/08/2012 1545   BUN 32* 09/08/2012 1545   CREATININE 1.69* 09/08/2012 1545   CREATININE 1.41* 08/16/2011 0637   CALCIUM 9.8 09/08/2012 1545   GFRNONAA 49* 08/16/2011 0637   GFRAA 57* 08/16/2011 0637     ASSESSMENT AND PLAN  Chronic systolic CHF (congestive heart failure) I think we have a great deal of uncertainty as to the cause of his dyspnea. The objective findings (thoracic impedance measurements, laboratory tests, chest x-ray and physical exam) do not really support acute heart failure. His creatinine has increased substantially with enhanced diuretic therapy. I think it is time to reestablish his volemic status with a right and left heart catheterization. This was last performed in January 2013 before he underwent implantation of a biventricular pacemaker/defibrillator.  The risks and benefits of the procedure were discussed in detail the patient understands and agrees to proceed. We will hold his ACE inhibitor and aldosterone inhibitor starting now. He will not take any loop diuretics on the day of the procedure. We will recheck his metabolic panel when he  arrives at the hospital.  After the procedure is performed I plan to not restart the ACE inhibitor. It is possible that his hoarseness and cough are indeed related to ACE inhibitor therapy. If his potassium has normalized we will consider starting an angiotensin receptor blocker.  Acute on chronic systolic CHF (congestive heart failure) It remains to be established whether his current complaints of dyspnea are indeed related to heart failure or possibly a noncardiac diagnosis.  Biventricular implantable cardioverter-defibrillator in situ Normal device function by recent check. No significant tachyarrhythmias.  CAD (coronary artery disease) As post coronary artery reimplantation at time of aneurysm surgery and bypass grafts. There was no evidence of any territories of coronary insufficiency at the time of his cardiac catheterization in January 2013  Aortic valve replaced Medtronic aortic Freestyle root replacement with normal findings by echocardiography and minimal gradient at the time of cardiac catheterization one year ago. This will be reevaluated.      Junious Silk, MD, [facility] [phone] office [phone] pager

## 2012-09-12 NOTE — Assessment & Plan Note (Signed)
Medtronic aortic Freestyle root replacement with normal findings by echocardiography and minimal gradient at the time of cardiac catheterization one year ago. This will be reevaluated.

## 2012-09-12 NOTE — Assessment & Plan Note (Signed)
I think we have a great deal of uncertainty as to the cause of his dyspnea. The objective findings (thoracic impedance measurements, laboratory tests, chest x-ray and physical exam) do not really support acute heart failure. His creatinine has increased substantially with enhanced diuretic therapy. I think it is time to reestablish his volemic status with a right and left heart catheterization. This was last performed in January 2013 before he underwent implantation of a biventricular pacemaker/defibrillator.  The risks and benefits of the procedure were discussed in detail the patient understands and agrees to proceed. We will hold his ACE inhibitor and aldosterone inhibitor starting now. He will not take any loop diuretics on the day of the procedure. We will recheck his metabolic panel when he arrives at the hospital.  After the procedure is performed I plan to not restart the ACE inhibitor. It is possible that his hoarseness and cough are indeed related to ACE inhibitor therapy. If his potassium has normalized we will consider starting an angiotensin receptor blocker.

## 2012-09-13 ENCOUNTER — Encounter (HOSPITAL_COMMUNITY): Payer: Self-pay | Admitting: Respiratory Therapy

## 2012-09-13 LAB — COMPREHENSIVE METABOLIC PANEL
ALT: 22 U/L (ref 0–53)
CO2: 28 mEq/L (ref 19–32)
Calcium: 9.8 mg/dL (ref 8.4–10.5)
Chloride: 99 mEq/L (ref 96–112)
Creat: 1.64 mg/dL — ABNORMAL HIGH (ref 0.50–1.35)
Glucose, Bld: 108 mg/dL — ABNORMAL HIGH (ref 70–99)
Total Bilirubin: 0.4 mg/dL (ref 0.3–1.2)

## 2012-09-13 LAB — PROTIME-INR
INR: 1 (ref <1.50)
Prothrombin Time: 13.2 seconds (ref 11.6–15.2)

## 2012-09-13 LAB — APTT: aPTT: 36 seconds (ref 24–37)

## 2012-09-15 ENCOUNTER — Other Ambulatory Visit: Payer: Self-pay | Admitting: Cardiovascular Disease

## 2012-09-15 DIAGNOSIS — I442 Atrioventricular block, complete: Secondary | ICD-10-CM

## 2012-09-15 DIAGNOSIS — R0602 Shortness of breath: Secondary | ICD-10-CM

## 2012-09-18 ENCOUNTER — Encounter (HOSPITAL_COMMUNITY)
Admission: RE | Disposition: A | Discharge status: Home / Self Care | Payer: Self-pay | Source: Ambulatory Visit | Attending: Cardiovascular Disease

## 2012-09-18 ENCOUNTER — Ambulatory Visit (HOSPITAL_COMMUNITY)
Admission: RE | Admit: 2012-09-18 | Discharge: 2012-09-18 | Disposition: A | Discharge status: Home / Self Care | Payer: Medicare Other | Source: Ambulatory Visit | Attending: Cardiovascular Disease | Admitting: Cardiovascular Disease

## 2012-09-18 DIAGNOSIS — Z888 Allergy status to other drugs, medicaments and biological substances status: Secondary | ICD-10-CM | POA: Insufficient documentation

## 2012-09-18 DIAGNOSIS — I10 Essential (primary) hypertension: Secondary | ICD-10-CM | POA: Insufficient documentation

## 2012-09-18 DIAGNOSIS — Z7902 Long term (current) use of antithrombotics/antiplatelets: Secondary | ICD-10-CM | POA: Insufficient documentation

## 2012-09-18 DIAGNOSIS — R0989 Other specified symptoms and signs involving the circulatory and respiratory systems: Secondary | ICD-10-CM | POA: Insufficient documentation

## 2012-09-18 DIAGNOSIS — Z95810 Presence of automatic (implantable) cardiac defibrillator: Secondary | ICD-10-CM | POA: Insufficient documentation

## 2012-09-18 DIAGNOSIS — Z954 Presence of other heart-valve replacement: Secondary | ICD-10-CM | POA: Insufficient documentation

## 2012-09-18 DIAGNOSIS — R0609 Other forms of dyspnea: Secondary | ICD-10-CM | POA: Insufficient documentation

## 2012-09-18 DIAGNOSIS — Z951 Presence of aortocoronary bypass graft: Secondary | ICD-10-CM | POA: Insufficient documentation

## 2012-09-18 DIAGNOSIS — Z87891 Personal history of nicotine dependence: Secondary | ICD-10-CM | POA: Insufficient documentation

## 2012-09-18 DIAGNOSIS — Z79899 Other long term (current) drug therapy: Secondary | ICD-10-CM | POA: Insufficient documentation

## 2012-09-18 DIAGNOSIS — I447 Left bundle-branch block, unspecified: Secondary | ICD-10-CM | POA: Insufficient documentation

## 2012-09-18 DIAGNOSIS — I509 Heart failure, unspecified: Secondary | ICD-10-CM | POA: Insufficient documentation

## 2012-09-18 DIAGNOSIS — Z952 Presence of prosthetic heart valve: Secondary | ICD-10-CM

## 2012-09-18 DIAGNOSIS — I5023 Acute on chronic systolic (congestive) heart failure: Secondary | ICD-10-CM | POA: Insufficient documentation

## 2012-09-18 DIAGNOSIS — I428 Other cardiomyopathies: Secondary | ICD-10-CM | POA: Insufficient documentation

## 2012-09-18 DIAGNOSIS — R0602 Shortness of breath: Secondary | ICD-10-CM

## 2012-09-18 DIAGNOSIS — I2510 Atherosclerotic heart disease of native coronary artery without angina pectoris: Secondary | ICD-10-CM | POA: Insufficient documentation

## 2012-09-18 HISTORY — PX: LEFT AND RIGHT HEART CATHETERIZATION WITH CORONARY/GRAFT ANGIOGRAM: SHX5448

## 2012-09-18 LAB — POCT I-STAT 3, VENOUS BLOOD GAS (G3P V)
Acid-base deficit: 1 mmol/L (ref 0.0–2.0)
Acid-base deficit: 1 mmol/L (ref 0.0–2.0)
Acid-base deficit: 3 mmol/L — ABNORMAL HIGH (ref 0.0–2.0)
Bicarbonate: 22.7 mEq/L (ref 20.0–24.0)
Bicarbonate: 24.3 mEq/L — ABNORMAL HIGH (ref 20.0–24.0)
O2 Saturation: 65 %
TCO2: 24 mmol/L (ref 0–100)
TCO2: 25 mmol/L (ref 0–100)
TCO2: 26 mmol/L (ref 0–100)
pCO2, Ven: 40.5 mmHg — ABNORMAL LOW (ref 45.0–50.0)
pO2, Ven: 32 mmHg (ref 30.0–45.0)
pO2, Ven: 34 mmHg (ref 30.0–45.0)

## 2012-09-18 LAB — BASIC METABOLIC PANEL
BUN: 21 mg/dL (ref 6–23)
CO2: 23 mEq/L (ref 19–32)
Chloride: 101 mEq/L (ref 96–112)
Creatinine, Ser: 1.41 mg/dL — ABNORMAL HIGH (ref 0.50–1.35)
GFR calc Af Amer: 57 mL/min — ABNORMAL LOW (ref >90)
Potassium: 4.2 mEq/L (ref 3.5–5.1)

## 2012-09-18 SURGERY — LEFT AND RIGHT HEART CATHETERIZATION WITH CORONARY/GRAFT ANGIOGRAM
Anesthesia: LOCAL

## 2012-09-18 MED ORDER — HEPARIN (PORCINE) IN NACL 2-0.9 UNIT/ML-% IJ SOLN
INTRAMUSCULAR | Status: AC
  Filled 2012-09-18: qty 500

## 2012-09-18 MED ORDER — ASPIRIN 81 MG PO CHEW
324.0000 mg | CHEWABLE_TABLET | ORAL | Status: AC
  Administered 2012-09-18: 324 mg via ORAL
  Filled 2012-09-18: qty 4

## 2012-09-18 MED ORDER — ASPIRIN 81 MG PO CHEW
CHEWABLE_TABLET | ORAL | Status: AC
  Filled 2012-09-18: qty 1

## 2012-09-18 MED ORDER — ACETAMINOPHEN 325 MG PO TABS: 650.0000 mg | ORAL_TABLET | ORAL | Status: DC | PRN

## 2012-09-18 MED ORDER — SODIUM CHLORIDE 0.9 % IJ SOLN: 3.0000 mL | INTRAMUSCULAR | Status: DC | PRN

## 2012-09-18 MED ORDER — MIDAZOLAM HCL 2 MG/2ML IJ SOLN
INTRAMUSCULAR | Status: AC
  Filled 2012-09-18: qty 2

## 2012-09-18 MED ORDER — FUROSEMIDE 40 MG PO TABS: 40.0000 mg | ORAL_TABLET | Freq: Every day | ORAL | Status: DC

## 2012-09-18 MED ORDER — SODIUM CHLORIDE 0.9 % IV SOLN: 1.0000 mL/kg/h | INTRAVENOUS | Status: AC

## 2012-09-18 MED ORDER — SODIUM CHLORIDE 0.9 % IV SOLN
INTRAVENOUS | Status: DC
  Administered 2012-09-18: 11:00:00 via INTRAVENOUS

## 2012-09-18 MED ORDER — HEPARIN (PORCINE) IN NACL 2-0.9 UNIT/ML-% IJ SOLN
INTRAMUSCULAR | Status: AC
  Filled 2012-09-18: qty 1000

## 2012-09-18 MED ORDER — FENTANYL CITRATE 0.05 MG/ML IJ SOLN
INTRAMUSCULAR | Status: AC
  Filled 2012-09-18: qty 2

## 2012-09-18 MED ORDER — ONDANSETRON HCL 4 MG/2ML IJ SOLN: 4.0000 mg | Freq: Four times a day (QID) | INTRAMUSCULAR | Status: DC | PRN

## 2012-09-18 MED ORDER — LIDOCAINE HCL (PF) 1 % IJ SOLN
INTRAMUSCULAR | Status: AC
  Filled 2012-09-18: qty 30

## 2012-09-18 NOTE — Op Note (Signed)
CARDIAC CATHETERIZATION REPORT   Procedures performed:  Right heart catheterization   Reason for procedure:  Shortness of breath Chronic systolic and diastolic heart failure  Procedure performed by: Thurmon Fair, MD, FACC  Complications: none   Estimated blood loss: less than 5 mL   History:  [name] has extensive history of structural heart disease including severely depressed left ventricular systolic function, coronary artery disease and heart disease. He has had 2 separate aortic valve replacement procedures most recently a root replacement with a Medtronic freestyle root conduits and coronary reimplantation. He received a CRT-D device. He has persistent and severe shortness of breath with NYHA functional class 3-4 status persistently, despite aggressive medical therapy. His symptoms of shortness of breath appear to be out of proportion when compared to the findings on radiological, biochemical and thoracic impedance evaluation of his heart failure.  Consent: The risks, benefits, and details of the procedure were explained to the patient. Risks including death, MI, stroke, bleeding, limb ischemia, renal failure and allergy were described and accepted by the patient. Informed written consent was obtained prior to proceeding.  Technique: The patient was brought to the cardiac catheterization laboratory in the fasting state. He was prepped and draped in the usual sterile fashion. Local anesthesia with 1% lidocaine was administered to the right groin area. Using the modified Seldinger technique a 6 French right common femoral vein sheath was introduced without difficulty. Under fluoroscopic guidance, using a balloontipped Swan-Ganz catheter, pressure is recorded in the right atrium right ventricle main pulmonary artery and pulmonary wedge position respectively a sample of mixed venous blood from the main pulmonary artery was obtained for oxygen saturation determination. Pulse oximetry was used  as a surrogate for systemic blood arterial saturation.  As the intracardiac pressures were found to be very low the entire system was recalibrated and zeroed to make sure that the values were not artifactual. The same results were obtained upon remeasurement.   Hemodynamic findings: PA wedge pressure a wave 5, v wave 7 (mean 1) mm Hg  Pulmonary artery 20/0 (mean 10) mm Hg  Right ventricle 25/0 with an end-diastolic pressure of 0 mm Hg  Right atrium a wave 3, v wave 3 (mean 0) mm Hg  Cardiac output is 4.34 L per minute (cardiac index 1.84 L per minute per meter sq)  Pulmonary arterial oxygen saturation 58%, right atrial oxygen saturation 56%, systemic saturation 96%. IMPRESSIONS:   [name] does not have any evidence of decompensation of chronic heart failure. In fact he appears to be grossly hypovolemic and therefore additional treatment with diuretics is definitely not indicated. It appears that his dyspnea has a noncardiac etiology. RECOMMENDATION:   Pulmonary function tests are good the place to start in his future evaluation. his dose of diuretics was decreased but no other changes were made to his medications or biventricular pacemaker/defibrillator settings     Thurmon Fair, MD, [facility] [phone] office [phone] pager

## 2012-09-18 NOTE — H&P (Signed)
  Date of Initial H&P: 09/12/12  History reviewed, patient examined, no change in status, stable for right and left heart catheterization for congestive heart failure.  This procedure has been fully reviewed with the patient and written informed consent has been obtained.  Thurmon Fair, MD, [facility] [phone] office [phone] pager

## 2012-09-18 NOTE — Progress Notes (Signed)
UP AND WALKED AND TOL WELL; RIGHT GROIN STABLE; NO BLEEDING OR HEMATOMA 

## 2012-09-22 ENCOUNTER — Telehealth: Payer: Self-pay | Admitting: Cardiovascular Disease

## 2012-09-22 MED ORDER — FUROSEMIDE 40 MG PO TABS: 20.0000 mg | ORAL_TABLET | Freq: Every day | ORAL | Status: DC

## 2012-09-22 NOTE — Telephone Encounter (Signed)
Wife called said the pharmacist could not fill the prescription because directions were not clear-Please call them so he can get his medicine!Call to [facility]-[phone]

## 2012-09-22 NOTE — Telephone Encounter (Signed)
Resend script of 20 mg to [facility] as per Dr Salena Saner

## 2012-09-27 ENCOUNTER — Other Ambulatory Visit: Payer: Self-pay | Admitting: Cardiovascular Disease

## 2012-09-27 DIAGNOSIS — R0602 Shortness of breath: Secondary | ICD-10-CM

## 2012-10-02 ENCOUNTER — Ambulatory Visit: Payer: Medicare Other

## 2012-10-07 ENCOUNTER — Other Ambulatory Visit: Payer: Self-pay | Admitting: Cardiovascular Disease

## 2012-10-07 DIAGNOSIS — I509 Heart failure, unspecified: Secondary | ICD-10-CM

## 2012-10-07 DIAGNOSIS — I428 Other cardiomyopathies: Secondary | ICD-10-CM

## 2012-10-07 LAB — ICD DEVICE OBSERVATION

## 2012-10-11 ENCOUNTER — Encounter: Payer: Self-pay | Admitting: *Deleted

## 2012-10-11 LAB — REMOTE ICD DEVICE
AL AMPLITUDE: 4.6 mv
AL IMPEDENCE ICD: 430 Ohm
AL IMPEDENCE ICD: 440 Ohm
AL THRESHOLD: 0.625 V
BAMS-0001: 180 {beats}/min
BAMS-0001: 180 {beats}/min
CHARGE TIME: 9.8 s
DEVICE MODEL ICD: 7037237
HV IMPEDENCE: 77 Ohm
LV LEAD THRESHOLD: 1.5 V
MODE SWITCH EPISODES: 0
MODE SWITCH EPISODES: 0
RV LEAD AMPLITUDE: 10.2 mv
RV LEAD AMPLITUDE: 11.3 mv
TZON-0003AFLUTTER: 333.3 ms
TZON-0003SLOWVT: 340 ms
TZON-0003SLOWVT: 340 ms
TZON-0004SLOWVT: 24
TZON-0010SLOWVT: 40 ms
TZON-0010SLOWVT: 40 ms
VENTRICULAR PACING ICD: 95 pct
VF: 0

## 2012-10-16 ENCOUNTER — Encounter (INDEPENDENT_AMBULATORY_CARE_PROVIDER_SITE_OTHER): Payer: Medicare Other

## 2012-10-16 DIAGNOSIS — R0602 Shortness of breath: Secondary | ICD-10-CM

## 2012-10-16 LAB — PULMONARY FUNCTION TEST

## 2012-11-03 ENCOUNTER — Encounter: Payer: Self-pay | Admitting: Cardiovascular Disease

## 2012-11-03 ENCOUNTER — Ambulatory Visit (INDEPENDENT_AMBULATORY_CARE_PROVIDER_SITE_OTHER): Payer: Medicare Other | Admitting: Cardiovascular Disease

## 2012-11-03 VITALS — BP 142/88 | HR 52 | Resp 16 | Ht 72.0 in | Wt 249.2 lb

## 2012-11-03 DIAGNOSIS — I2510 Atherosclerotic heart disease of native coronary artery without angina pectoris: Secondary | ICD-10-CM

## 2012-11-03 DIAGNOSIS — Z954 Presence of other heart-valve replacement: Secondary | ICD-10-CM

## 2012-11-03 DIAGNOSIS — Z952 Presence of prosthetic heart valve: Secondary | ICD-10-CM

## 2012-11-03 DIAGNOSIS — I5022 Chronic systolic (congestive) heart failure: Secondary | ICD-10-CM

## 2012-11-03 DIAGNOSIS — I447 Left bundle-branch block, unspecified: Secondary | ICD-10-CM

## 2012-11-03 DIAGNOSIS — J984 Other disorders of lung: Secondary | ICD-10-CM

## 2012-11-03 DIAGNOSIS — Z95810 Presence of automatic (implantable) cardiac defibrillator: Secondary | ICD-10-CM

## 2012-11-03 DIAGNOSIS — I509 Heart failure, unspecified: Secondary | ICD-10-CM

## 2012-11-03 NOTE — Patient Instructions (Addendum)
You have been referred to Pulmonary specialist.  Your physician recommends that you schedule a follow-up appointment in: 6 months

## 2012-11-04 ENCOUNTER — Other Ambulatory Visit: Payer: Self-pay | Admitting: Cardiovascular Disease

## 2012-11-04 DIAGNOSIS — I509 Heart failure, unspecified: Secondary | ICD-10-CM

## 2012-11-04 DIAGNOSIS — I428 Other cardiomyopathies: Secondary | ICD-10-CM

## 2012-11-05 ENCOUNTER — Encounter: Payer: Self-pay | Admitting: Cardiovascular Disease

## 2012-11-05 DIAGNOSIS — J984 Other disorders of lung: Secondary | ICD-10-CM | POA: Insufficient documentation

## 2012-11-05 NOTE — Assessment & Plan Note (Signed)
Redo aortic valve replacement with a Medtronic freestyle root in 2009, coronary reimplantation and redo bypass surgery normal static valve function by echocardiography and cardiac

## 2012-11-05 NOTE — Progress Notes (Signed)
Patient ID: SERGI GELLNER, male   DOB: 1941-02-28, 72 y.o.   MRN: [id number]     Reason for office visit Followup shortness of breath/pulmonary function tests  Mr. Balis has extensive history of structural heart disease including severely depressed left ventricular systolic function, coronary artery disease and heart disease. He has had 2 separate aortic valve replacement procedures most recently a root replacement with a Medtronic freestyle root conduits and coronary reimplantation. He received a CRT-D device. He has persistent and severe shortness of breath with NYHA functional class 3-4 status persistently, despite aggressive medical therapy. His symptoms of shortness of breath appear to be out of proportion when compared to the findings on radiological, biochemical and thoracic impedance evaluation of his heart failure.  Coronary angiography in January of 2013 showed a 80% stenosis of the ostium of the saphenous vein graft to the right coronary artery that could not explain the severity of his cardiomyopathy. The discrepancy between the severity of his shortness of breath and the clinical findings lead to repeat right heart catheterization performed on June 9. He was actually found to be profoundly hypovolemic with a pulmonary artery wedge pressure of only 1 mm Hg. His cardiac output was low. We reduced the dose of his diuretics and he feels better.  Lung function testing was then performed and shows evidence of moderate restrictive lung disease. His forced vital capacity is about 50% of normal and there is a comments root reduction in his FEV1 and diffusion capacity. The total lung capacity is less severely reduced at about 72% of predicted. He used to work in a [employer] and in [employer] but has not had a history of significant occupational exposure to asbestos or other causes of occupational lung disease.   Allergies  Allergen Reactions  . Ace Inhibitors Cough    Current Outpatient  Prescriptions  Medication Sig Dispense Refill  . allopurinol (ZYLOPRIM) 300 MG tablet Take 300 mg by mouth daily.        Marland Kitchen atorvastatin (LIPITOR) 20 MG tablet Take 20 mg by mouth daily.        . carvedilol (COREG) 6.25 MG tablet Take 1 tablet (6.25 mg total) by mouth 2 (two) times daily with a meal.  180 tablet  1  . clopidogrel (PLAVIX) 75 MG tablet Take 75 mg by mouth daily.        . Copper Gluconate (COPPER CAPS PO) Take 1 capsule by mouth daily.        . Ferrous Sulfate (IRON) 28 MG TABS Take 28 mg by mouth daily.      . folic acid (FOLVITE) 800 MCG tablet Take 800 mcg by mouth daily.       . furosemide (LASIX) 40 MG tablet Take 0.5 tablets (20 mg total) by mouth daily.  90 tablet  3  . KLOR-CON 10 10 MEQ tablet Take 10 mEq by mouth daily.      . Omega-3 Fatty Acids (FISH OIL PO) Take 1,200 mg by mouth daily.       . vitamin B-12 (CYANOCOBALAMIN) 1000 MCG tablet Take 500 mcg by mouth daily.        No current facility-administered medications for this visit.    Past Medical History  Diagnosis Date  . Nonischemic cardiomyopathy     EF 25-30%  . Chronic systolic dysfunction of left ventricle   . Valvular cardiomyopathy   . Aortic valve replaced     x2 2002 & 2009 Medtronic free style aortic root  .  CAD (coronary artery disease)     s/p CABG  . LBBB (left bundle branch block)     QRS  . CHF (congestive heart failure)   . Shortness of breath   . Hypertension   . 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 Aortic valve replacement      s/p 2 separate AVR procedures, an initial mechanical AVR was replaced with a Medtronic Freestyle root in 2009 with reimplantation of the cors as well as redo SVG to RCA and preservation of LIMA to LAD  . Coronary artery bypass graft      2002  . Biv icd implant  08/16/11    SJM BiV ICD implanted by Dr Johney Frame  . Cardiac valve replacement       Family History  Problem Relation Age of Onset  . Diabetes Mother   . Hypertension Mother   . Heart attack Father   . Heart attack Brother   . Diabetes Brother   . Cancer Sister   . Diabetes Sister   . Hypertension Sister     History   Social History  . Marital Status: Married    Spouse Name: N/A    Number of Children: N/A  . Years of Education: N/A   Occupational History  . Not on file.   Social History Main Topics  . Smoking status: Former Smoker    Types: Cigarettes    Quit date: 04/12/1992  . Smokeless tobacco: Never Used  . Alcohol Use: No  . Drug Use: No  . Sexually Active: Not on file   Other Topics Concern  . Not on file   Social History Narrative  . No narrative on file    Review of systems: He has chronic shortness of breath NYHA functional class 2-3. He denies angina orthopnea person of dyspnea syncope palpitations focal neurological deficits and lower extremity edema.  He denies unexplained weight gain, cough, hemoptysis or wheezing.  The patient also denies abdominal pain, nausea, vomiting, dysphagia, diarrhea, constipation, polyuria, polydipsia, dysuria, hematuria, frequency, urgency, abnormal bleeding or bruising, fever, chills, unexpected weight changes, mood swings, change in skin or hair texture, change in voice quality, auditory or visual problems, allergic reactions or rashes, new musculoskeletal complaints other than usual "aches and pains".   PHYSICAL EXAM BP 142/88  Pulse 52  Resp 16  Ht 6' (1.829 m)  Wt 249 lb 3.2 oz (113.036 kg)  BMI 33.79 kg/m2  General: Alert, oriented x3, no distress Head: no evidence of trauma, PERRL, EOMI, no exophtalmos or lid lag, no myxedema, no xanthelasma; normal ears, nose and oropharynx Neck: normal jugular venous pulsations and no hepatojugular reflux; brisk carotid pulses without delay and no carotid bruits Chest: clear to auscultation, no signs of consolidation by percussion or palpation, normal  fremitus, symmetrical and full respiratory excursions; sternotomy scar; healed subclavian pacemaker site Cardiovascular: Laterally displaced, effaced apical impulse, regular rhythm, normal first and second heart sounds, no murmurs, rubs or gallops Abdomen: no tenderness or distention, no masses by palpation, no abnormal pulsatility or arterial bruits, normal bowel sounds, no hepatosplenomegaly Extremities: no clubbing, cyanosis or edema; 2+ radial, ulnar and brachial pulses bilaterally; 2+ right femoral, posterior tibial and dorsalis pedis pulses; 2+ left femoral, posterior tibial and dorsalis pedis pulses; no subclavian or femoral bruits Neurological: grossly nonfocal   EKG: Atrial sensed ventricular paced   BMET    Component Value  Date/Time   NA 135 09/18/2012 1049   K 4.2 09/18/2012 1049   CL 101 09/18/2012 1049   CO2 23 09/18/2012 1049   GLUCOSE 115* 09/18/2012 1049   BUN 21 09/18/2012 1049   CREATININE 1.41* 09/18/2012 1049   CREATININE 1.64* 09/12/2012 1510   CALCIUM 9.2 09/18/2012 1049   GFRNONAA 49* 09/18/2012 1049   GFRAA 57* 09/18/2012 1049     ASSESSMENT AND PLAN Chronic systolic CHF (congestive heart failure) He has NYHA functional class 2-3 despite the fact that he is clearly euvolemic (confirmed by cardiac catheterization), and it is quite difficult to distinguish how much of his dyspnea is related to his lung problems and how much to his cardiac problem. He is on maximum tolerated medical therapy. He is on good doses of beta blockers, ACE inhibitor and aldosterone antagonist. We reduced the dose of his diuretic and this actually made him feel better.  LBBB (left bundle branch block) He has had AV optimization performed. There is no clear evidence that he responded to CRT. It is possible that his dyspnea persisted because of extra cardiac problems  Biventricular implantable cardioverter-defibrillator in situ CRT-D dual-chamber St[employer] Jude Quadra assura model [id number], serial number [id number]  implanted in May 2013. Normal device function by recent testing. He is enrolled in the [facility] remote followup system.  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 Laneta Simmers. Cardiac catheterization January 2013 showed ostial stenosis of the saphenous vein graft bypass to the right coronary system but good distal flow. His cardiomyopathy could not be explained based on his coronary abnormality.  Restrictive lung disease The cause for this is uncertain. Mr. Dollinger is only mildly obese, has no clear history of occupational or infectious lung injury and has not had lung surgery. Further evaluation might include a high-resolution CT or a variety of serological tests.   Orders Placed This Encounter  Procedures  . Ambulatory referral to Pulmonology   Meds ordered this encounter  Medications  . KLOR-CON 10 10 MEQ tablet    Sig: Take 10 mEq by mouth daily.    Junious Silk, MD, [facility] [phone] office [phone] pager

## 2012-11-05 NOTE — Assessment & Plan Note (Signed)
He has had AV optimization performed. There is no clear evidence that he responded to CRT. It is possible that his dyspnea persisted because of extra cardiac problems

## 2012-11-05 NOTE — Assessment & Plan Note (Addendum)
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 Laneta Simmers. Cardiac catheterization January 2013 showed ostial stenosis of the saphenous vein graft bypass to the right coronary system but good distal flow. His cardiomyopathy could not be explained based on his coronary abnormality.

## 2012-11-05 NOTE — Assessment & Plan Note (Signed)
The cause for this is uncertain. [name] is only mildly obese, has no clear history of occupational or infectious lung injury and has not had lung surgery. Further evaluation might include a high-resolution CT or a variety of serological tests.

## 2012-11-05 NOTE — Assessment & Plan Note (Signed)
CRT-D dual-chamber St. Jude Quadra assura model [id number], serial number [id number] implanted in May 2013. Normal device function by recent testing. He is enrolled in the [facility] remote followup system.

## 2012-11-05 NOTE — Assessment & Plan Note (Signed)
He has NYHA functional class 2-3 despite the fact that he is clearly euvolemic (confirmed by cardiac catheterization), and it is quite difficult to distinguish how much of his dyspnea is related to his lung problems and how much to his cardiac problem. He is on maximum tolerated medical therapy. He is on good doses of beta blockers, ACE inhibitor and aldosterone antagonist. We reduced the dose of his diuretic and this actually made him feel better.

## 2012-11-13 ENCOUNTER — Encounter: Payer: Self-pay | Admitting: Internal Medicine

## 2012-11-13 ENCOUNTER — Ambulatory Visit (INDEPENDENT_AMBULATORY_CARE_PROVIDER_SITE_OTHER): Payer: Medicare Other | Admitting: Internal Medicine

## 2012-11-13 VITALS — BP 118/82 | HR 76 | Temp 97.5°F | Ht 71.0 in | Wt 249.4 lb

## 2012-11-13 DIAGNOSIS — J984 Other disorders of lung: Secondary | ICD-10-CM

## 2012-11-13 DIAGNOSIS — R0609 Other forms of dyspnea: Secondary | ICD-10-CM

## 2012-11-13 DIAGNOSIS — R0989 Other specified symptoms and signs involving the circulatory and respiratory systems: Secondary | ICD-10-CM

## 2012-11-13 DIAGNOSIS — R0600 Dyspnea, unspecified: Secondary | ICD-10-CM

## 2012-11-13 MED ORDER — PANTOPRAZOLE SODIUM 40 MG PO TBEC: 40.0000 mg | DELAYED_RELEASE_TABLET | Freq: Every day | ORAL | Status: DC

## 2012-11-13 MED ORDER — FAMOTIDINE 20 MG PO TABS: ORAL_TABLET | ORAL | Status: DC

## 2012-11-13 MED ORDER — PREDNISONE (PAK) 10 MG PO TABS: ORAL_TABLET | ORAL | Status: DC

## 2012-11-13 NOTE — Patient Instructions (Addendum)
GERD (REFLUX)  is an extremely common cause of respiratory symptoms, many times with no significant heartburn at all.    It can be treated with medication, but also with lifestyle changes including avoidance of late meals, excessive alcohol, smoking cessation, and avoid fatty foods, chocolate, peppermint, colas, red wine, and acidic juices such as orange juice.  NO MINT OR MENTHOL PRODUCTS SO NO COUGH DROPS  USE SUGARLESS CANDY INSTEAD (jolley ranchers or Stover's)  NO OIL BASED VITAMINS - use powdered substitutes.  Pantoprazole (protonix) 40 mg   Take 30-60 min before first meal of the day and Pepcid 20 mg one bedtime until return to office - this is the best way to tell whether stomach acid is contributing to your problem.    Prednisone 10 mg take  4 each am x 2 days,   2 each am x 2 days,  1 each am x 2 days and stop   Please schedule a follow up office visit in 4 weeks, sooner if needed    .

## 2012-11-13 NOTE — Progress Notes (Signed)
  Subjective:    Patient ID: [name], male    DOB: 03-17-1941  MRN: [id number]  HPI  48 yowm  Quit smoking 1994 with cough attributed to cigs and resolved completely at wt 220 and then cough recurred 2009 on enalapril then breathing got worse starting 2013 and referred 11/13/2012 to pulmonary after a neg cardiac eval by Croitoru.  11/13/2012 1st pulmonary cc daily cough x 5 years waxes and wanes better while sleeping but w/in a few min after rising starts the cough again, never brings anything  Up, dates back to a year before surgeon, while on enalapril which was stopped about a month prior to OV .  Assoc with cough has doe but legs give out first.  No obvious daytime variabilty or assoc chronic cough or cp or chest tightness, subjective wheeze overt sinus or hb symptoms. No unusual exp hx or h/o childhood pna/ asthma or knowledge of premature birth.   Sleeping ok without nocturnal  or early am exacerbation  of respiratory  c/o's or need for noct saba. Also denies any obvious fluctuation of symptoms with weather or environmental changes or other aggravating or alleviating factors except as outlined above    Review of Systems  Constitutional: Negative for fever and unexpected weight change.  HENT: Negative for ear pain, nosebleeds, congestion, sore throat, rhinorrhea, sneezing, trouble swallowing, dental problem, postnasal drip and sinus pressure.   Eyes: Negative for redness and itching.  Respiratory: Positive for cough ( congestion), shortness of breath and wheezing. Negative for chest tightness.   Cardiovascular: Positive for chest pain. Negative for palpitations and leg swelling.  Gastrointestinal: Negative for nausea and vomiting.  Genitourinary: Positive for difficulty urinating. Negative for dysuria.  Musculoskeletal: Negative for joint swelling.  Skin: Negative for rash.  Neurological: Negative for headaches.  Hematological: Does not bruise/bleed easily.  Psychiatric/Behavioral:  Negative for dysphoric mood. The patient is not nervous/anxious.        Objective:   Physical Exam  amb stoic male nad with freq throat clearing and prominent pseudowheeze Wt Readings from Last 3 Encounters:  11/13/12 249 lb 6.4 oz (113.127 kg)  11/03/12 249 lb 3.2 oz (113.036 kg)  09/18/12 242 lb (109.77 kg)    HEENT: nl dentition, turbinates, and orophanx. Nl external ear canals without cough reflex   NECK :  without JVD/Nodes/TM/ nl carotid upstrokes bilaterally   LUNGS: no acc muscle use, clear to A and P bilaterally without cough on insp or exp maneuvers   CV:  RRR  no s3 or murmur or increase in P2, no edema   ABD: obese  soft and nontender with nl excursion in the supine position. No bruits or organomegaly, bowel sounds nl  MS:  warm without deformities, calf tenderness, cyanosis or clubbing  SKIN: warm and dry without lesions    NEURO:  alert, approp, no deficits       09/12/12 cxr Stable chest x-ray with borderline cardiomegaly. Defibrillator  lead remains.      Assessment & Plan:

## 2012-11-14 DIAGNOSIS — R0600 Dyspnea, unspecified: Secondary | ICD-10-CM | POA: Insufficient documentation

## 2012-11-14 NOTE — Assessment & Plan Note (Signed)
PFT's 10/16/12  Fev1 1.92 (57%) and 75 ratio, DLCO 68%   Most likely on basis of obesity, not ILD

## 2012-11-14 NOTE — Assessment & Plan Note (Signed)
  When respiratory symptoms begin or become refractory well after a patient reports complete smoking cessation,  Especially when this wasn't the case while they were smoking, a red flag is raised based on the work of Dr Primitivo Gauze which states:  if you quit smoking when your best day FEV1 is still well preserved (which his clearly was) it is highly unlikely you will progress to severe disease.  That is to say, once the smoking stops,  the symptoms should not suddenly erupt or markedly worsen.  If so, the differential diagnosis should include  obesity/deconditioning,  LPR/Reflux/Aspiration syndromes,  occult CHF, or  especially side effect of medications commonly used in this population, esp acei, which although has been stopped, still has lingering effects on the upper airway and may promote secondary LPR from incessant cough and throat clearing  rec max gerd rx/ diet then regroup in 4 weeks

## 2012-11-21 ENCOUNTER — Encounter: Payer: Self-pay | Admitting: Cardiovascular Disease

## 2012-11-27 ENCOUNTER — Encounter: Payer: Self-pay | Admitting: *Deleted

## 2012-11-27 LAB — REMOTE ICD DEVICE
AL AMPLITUDE: 3 mv
AL IMPEDENCE ICD: 440 Ohm
AL THRESHOLD: 0.75 V
BAMS-0001: 180 {beats}/min
DEVICE MODEL ICD: 7037237
HV IMPEDENCE: 73 Ohm
LV LEAD IMPEDENCE ICD: 680 Ohm
LV LEAD THRESHOLD: 1.875 V
MODE SWITCH EPISODES: 0
RV LEAD AMPLITUDE: 11.9 mv
RV LEAD IMPEDENCE ICD: 380 Ohm
RV LEAD THRESHOLD: 0.625 V
TZON-0003AFLUTTER: 333.3 ms
TZON-0010SLOWVT: 40 ms
VF: 0

## 2012-12-13 ENCOUNTER — Ambulatory Visit: Payer: Medicare Other | Admitting: Internal Medicine

## 2012-12-14 ENCOUNTER — Encounter: Payer: Self-pay | Admitting: Internal Medicine

## 2013-01-10 ENCOUNTER — Encounter: Payer: Self-pay | Admitting: Cardiovascular Disease

## 2013-01-30 ENCOUNTER — Other Ambulatory Visit: Payer: Self-pay

## 2013-01-30 MED ORDER — CARVEDILOL 6.25 MG PO TABS: 6.2500 mg | ORAL_TABLET | Freq: Two times a day (BID) | ORAL | Status: DC

## 2013-02-06 ENCOUNTER — Encounter: Payer: Self-pay | Admitting: *Deleted

## 2013-02-15 ENCOUNTER — Other Ambulatory Visit: Payer: Self-pay

## 2013-02-15 MED ORDER — CARVEDILOL 6.25 MG PO TABS: 6.2500 mg | ORAL_TABLET | Freq: Two times a day (BID) | ORAL | Status: DC

## 2013-02-28 ENCOUNTER — Other Ambulatory Visit: Payer: Self-pay | Admitting: Internal Medicine

## 2013-05-02 ENCOUNTER — Encounter: Payer: Self-pay | Admitting: *Deleted

## 2013-05-31 ENCOUNTER — Encounter: Payer: Self-pay | Admitting: Cardiovascular Disease

## 2013-08-23 ENCOUNTER — Ambulatory Visit: Payer: Self-pay | Admitting: Otolaryngology

## 2013-08-31 ENCOUNTER — Ambulatory Visit (INDEPENDENT_AMBULATORY_CARE_PROVIDER_SITE_OTHER): Payer: Medicare HMO | Admitting: Cardiovascular Disease

## 2013-08-31 ENCOUNTER — Encounter: Payer: Self-pay | Admitting: Cardiovascular Disease

## 2013-08-31 VITALS — BP 128/74 | HR 90 | Resp 16 | Ht 72.0 in | Wt 249.9 lb

## 2013-08-31 DIAGNOSIS — J984 Other disorders of lung: Secondary | ICD-10-CM

## 2013-08-31 DIAGNOSIS — Z95810 Presence of automatic (implantable) cardiac defibrillator: Secondary | ICD-10-CM

## 2013-08-31 DIAGNOSIS — I447 Left bundle-branch block, unspecified: Secondary | ICD-10-CM

## 2013-08-31 DIAGNOSIS — I442 Atrioventricular block, complete: Secondary | ICD-10-CM

## 2013-08-31 DIAGNOSIS — R0989 Other specified symptoms and signs involving the circulatory and respiratory systems: Secondary | ICD-10-CM

## 2013-08-31 DIAGNOSIS — Z79899 Other long term (current) drug therapy: Secondary | ICD-10-CM

## 2013-08-31 DIAGNOSIS — I5022 Chronic systolic (congestive) heart failure: Secondary | ICD-10-CM

## 2013-08-31 DIAGNOSIS — I2510 Atherosclerotic heart disease of native coronary artery without angina pectoris: Secondary | ICD-10-CM

## 2013-08-31 DIAGNOSIS — R0600 Dyspnea, unspecified: Secondary | ICD-10-CM

## 2013-08-31 DIAGNOSIS — I509 Heart failure, unspecified: Secondary | ICD-10-CM

## 2013-08-31 DIAGNOSIS — R0609 Other forms of dyspnea: Secondary | ICD-10-CM

## 2013-08-31 LAB — PACEMAKER DEVICE OBSERVATION

## 2013-08-31 MED ORDER — FUROSEMIDE 40 MG PO TABS: 40.0000 mg | ORAL_TABLET | Freq: Every day | ORAL | Status: DC

## 2013-08-31 NOTE — Patient Instructions (Addendum)
Take Furosemide 40mg  daily except 60mg  on Mondays/Wednesdays/Fridays.  Your physician recommends that you return for lab work in: 2 weeks.  Dr. Royann Shivers recommends that you schedule a follow-up appointment in: 3 months.

## 2013-09-03 ENCOUNTER — Encounter: Payer: Self-pay | Admitting: Cardiovascular Disease

## 2013-09-03 NOTE — Assessment & Plan Note (Signed)
He has a mild acute exacerbation of his chronic heart failure. This has improved with an increased dose of diuretics, but he still has evidence of hypervolemia. We will ask him to increase his furosemide to 40 mg 3 days a week and 60 mg the other 4 days of the week. Repeat lab tests in a couple of weeks.

## 2013-09-03 NOTE — Assessment & Plan Note (Signed)
The pulmonologists opinion was that he does not have interstitial lung disease and that the restrictive defect on his pulmonary function tests is mostly obesity related.

## 2013-09-03 NOTE — Assessment & Plan Note (Signed)
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 Laneta Simmers. Cardiac catheterization January 2013 showed 80% ostial stenosis of the saphenous vein graft bypass to the right coronary system but good distal flow. He does not have angina pectoris.

## 2013-09-03 NOTE — Assessment & Plan Note (Signed)
Normal device function with virtually 100% biventricular pacing. No major atrial or ventricular arrhythmia has been recorded. As mentioned his thoracic impedance dropped in late March and April and is slowly improving. We'll check corevue monthly and see him back in the office in 3 months.

## 2013-09-03 NOTE — Progress Notes (Signed)
Patient ID: [name], male   DOB: 1940-11-25, 73 y.o.   MRN: [id number]     Reason for office visit CHF, CRT-D followup, CAD, status post aortic valve and root replacement  [name] is here for defibrillator check. He has not had episodes of major heart failure exacerbation. Mrs. Kimberling has done a good job of regulating his diuretic dosage based on his weight and symptoms. Approximately one month ago she increased his furosemide dose to 40 mg a day has had resolution of lower showed edema and improvement in his breathing. Roughly one month ago his thoracic impedance clearly went down, but it has partially improved since that diuretic dose adjustment. It is still lower than his baseline.  He saw Dr. Sherene Sires in consultation for residual dyspnea even when there was hemodynamic evidence of excellent CHF compensation. He felt that his pulmonary function test abnormalities are most likely obesity related and that his persistent cough may have been a lingering effect of ACE inhibitors, although these have been stopped a long time earlier.  Overall, the patient feels reasonably well. He is lying almost fully horizontal on the examination table without difficulty today. No complaints of chest pain, edema or dizziness.  Device interrogation shows normal function, the absence of major arrhythmia, excellent biventricular pacing and normal thoracic impedance.  [name] has extensive history of structural heart disease including severely depressed left ventricular systolic function, coronary artery disease and heart disease. He has had 2 separate aortic valve replacement procedures most recently a root replacement with a Medtronic freestyle root conduits and coronary reimplantation. He received a CRT-D device. He has persistent and severe shortness of breath with NYHA functional class 3-4 status persistently, despite aggressive medical therapy. His symptoms of shortness of breath appear to be out of proportion  when compared to the findings on radiological, biochemical and thoracic impedance evaluation of his heart failure.  Coronary angiography in January of 2013 showed a 80% stenosis of the ostium of the saphenous vein graft to the right coronary artery that could not explain the severity of his cardiomyopathy. The discrepancy between the severity of his shortness of breath and the clinical findings lead to repeat right heart catheterization performed on June 9. He was actually found to be profoundly hypovolemic with a pulmonary artery wedge pressure of only 1 mm Hg. His cardiac output was low. We reduced the dose of his diuretics and he feels better.  Lung function testing was then performed and shows evidence of moderate restrictive lung disease. His forced vital capacity is about 50% of normal and there is a comments root reduction in his FEV1 and diffusion capacity. The total lung capacity is less severely reduced at about 72% of predicted. He used to work in a [employer] and in [employer] but has not had a history of significant occupational exposure to asbestos or other causes of occupational lung disease.  Allergies  Allergen Reactions  . Ace Inhibitors Cough    Current Outpatient Prescriptions  Medication Sig Dispense Refill  . allopurinol (ZYLOPRIM) 300 MG tablet Take 300 mg by mouth daily.        Marland Kitchen amitriptyline (ELAVIL) 25 MG tablet Take 25 mg by mouth at bedtime.      Marland Kitchen atorvastatin (LIPITOR) 20 MG tablet Take 20 mg by mouth daily.        . carvedilol (COREG) 6.25 MG tablet Take 1 tablet (6.25 mg total) by mouth 2 (two) times daily with a meal.  180 tablet  1  .  clopidogrel (PLAVIX) 75 MG tablet Take 75 mg by mouth daily.        . famotidine (PEPCID) 20 MG tablet One at bedtime  30 tablet  2  . Ferrous Sulfate (IRON) 28 MG TABS Take 28 mg by mouth daily.      . folic acid (FOLVITE) 800 MCG tablet Take 800 mcg by mouth daily.       . furosemide (LASIX) 40 MG tablet Take 1 tablet (40 mg  total) by mouth daily. Take 40mg  daily except 60mg  Mondays/Wednesdays/Fridays.  120 tablet  3  . Multiple Vitamin (MULTIVITAMIN) tablet Take 1 tablet by mouth daily.      . pantoprazole (PROTONIX) 40 MG tablet Take 1 tablet (40 mg total) by mouth daily. Take 30-60 min before first meal of the day  30 tablet  2   No current facility-administered medications for this visit.    Past Medical History  Diagnosis Date  . Nonischemic cardiomyopathy     EF 25-30%  . Chronic systolic dysfunction of left ventricle   . Valvular cardiomyopathy   . Aortic valve replaced     x2 2002 & 2009 Medtronic free style aortic root  . CAD (coronary artery disease)     s/p CABG  . LBBB (left bundle branch block)     QRS 180msec  . CHF (congestive heart failure)   . Shortness of breath   . Hypertension   . 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 Aortic valve replacement      s/p 2 separate AVR procedures, an initial mechanical AVR was replaced with a Medtronic Freestyle root in 2009 with reimplantation of the cors as well as redo SVG to RCA and preservation of LIMA to LAD  . Coronary artery bypass graft      2002  . Biv icd implant  08/16/11    SJM BiV ICD implanted by Dr Johney FrameAllred  . Cardiac valve replacement      Family History  Problem Relation Age of Onset  . Diabetes Mother   . Hypertension Mother   . Heart attack Father   . Heart attack Brother   . Diabetes Brother   . Cancer Sister   . Diabetes Sister   . Hypertension Sister     History   Social History  . Marital Status: Married    Spouse Name: N/A    Number of Children: N/A  . Years of Education: N/A   Occupational History  . retired    Social History Main Topics  . Smoking status: Former Smoker -- 1.50 packs/day for 36 years    Types: Cigarettes    Quit date: 04/12/1992  . Smokeless tobacco: Never Used  . Alcohol Use: Yes      Comment: moderate  . Drug Use: No  . Sexual Activity: Not on file   Other Topics Concern  . Not on file   Social History Narrative  . No narrative on file    Review of systems: He has chronic shortness of breath NYHA functional class 2. He denies angina orthopnea person of dyspnea syncope palpitations focal neurological deficits and lower extremity edema.  He denies unexplained weight gain, cough, hemoptysis or wheezing.  The patient also denies abdominal pain, nausea, vomiting, dysphagia, diarrhea, constipation, polyuria, polydipsia, dysuria, hematuria, frequency, urgency, abnormal bleeding or bruising, fever, chills, unexpected weight changes, mood swings, change in skin or hair  texture, change in voice quality, auditory or visual problems, allergic reactions or rashes, new musculoskeletal complaints other than usual "aches and pains".   PHYSICAL EXAM BP 128/74  Pulse 90  Resp 16  Ht 6' (1.829 m)  Wt 249 lb 14.4 oz (113.354 kg)  BMI 33.89 kg/m2 General: Alert, oriented x3, no distress  Head: no evidence of trauma, PERRL, EOMI, no exophtalmos or lid lag, no myxedema, no xanthelasma; normal ears, nose and oropharynx  Neck: normal jugular venous pulsations and no hepatojugular reflux; brisk carotid pulses without delay and no carotid bruits  Chest: clear to auscultation, no signs of consolidation by percussion or palpation, normal fremitus, symmetrical and full respiratory excursions; sternotomy scar; healed subclavian pacemaker site  Cardiovascular: Laterally displaced, effaced apical impulse, regular rhythm, normal first and second heart sounds, no murmurs, rubs or gallops  Abdomen: no tenderness or distention, no masses by palpation, no abnormal pulsatility or arterial bruits, normal bowel sounds, no hepatosplenomegaly  Extremities: no clubbing, cyanosis or edema; 2+ radial, ulnar and brachial pulses bilaterally; 2+ right femoral, posterior tibial and dorsalis pedis pulses; 2+ left  femoral, posterior tibial and dorsalis pedis pulses; no subclavian or femoral bruits  Neurological: grossly nonfocal   EKG: A sensed BiV paced  BMET    Component Value Date/Time   NA 135 09/18/2012 1049   K 4.2 09/18/2012 1049   CL 101 09/18/2012 1049   CO2 23 09/18/2012 1049   GLUCOSE 115* 09/18/2012 1049   BUN 21 09/18/2012 1049   CREATININE 1.41* 09/18/2012 1049   CREATININE 1.64* 09/12/2012 1510   CALCIUM 9.2 09/18/2012 1049   GFRNONAA 49* 09/18/2012 1049   GFRNONAA 40* 09/08/2012 1545   GFRAA 57* 09/18/2012 1049   GFRAA 47* 09/08/2012 1545     ASSESSMENT AND PLAN Chronic systolic CHF (congestive heart failure) He has a mild acute exacerbation of his chronic heart failure. This has improved with an increased dose of diuretics, but he still has evidence of hypervolemia. We will ask him to increase his furosemide to 40 mg 3 days a week and 60 mg the other 4 days of the week. Repeat lab tests in a couple of weeks.  Restrictive lung disease The pulmonologists opinion was that he does not have interstitial lung disease and that the restrictive defect on his pulmonary function tests is mostly obesity related.  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 Laneta Simmers. Cardiac catheterization January 2013 showed 80% ostial stenosis of the saphenous vein graft bypass to the right coronary system but good distal flow. He does not have angina pectoris.  Biventricular implantable cardioverter-defibrillator in situ Normal device function with virtually 100% biventricular pacing. No major atrial or ventricular arrhythmia has been recorded. As mentioned his thoracic impedance dropped in late March and April and is slowly improving. We'll check corevue monthly and see him back in the office in 3 months.   Patient Instructions    Take Furosemide 40mg  daily except 60mg  on Mondays/Wednesdays/Fridays.  Your physician recommends that you return for lab work in: 2 weeks.  Dr. Royann Shivers recommends that you schedule a follow-up appointment in: 3 months.       Orders Placed This Encounter  Procedures  . Basic metabolic panel  . Brain natriuretic peptide  . EKG 12-Lead   Meds ordered this encounter  Medications  .  amitriptyline (ELAVIL) 25 MG tablet    Sig: Take 25 mg by mouth at bedtime.  . Multiple Vitamin (MULTIVITAMIN) tablet    Sig: Take 1 tablet by mouth daily.  Marland Kitchen DISCONTD: furosemide (LASIX) 40 MG tablet    Sig: Take 40 mg by mouth daily. Take 40mg  daily except 60mg  Mondays/Wednesdays/Fridays.  . furosemide (LASIX) 40 MG tablet    Sig: Take 1 tablet (40 mg total) by mouth daily. Take 40mg  daily except 60mg  Mondays/Wednesdays/Fridays.    Dispense:  120 tablet    Refill:  3    Sara Keys  Thurmon Fair, MD, [facility] [phone] office [phone] pager

## 2013-09-10 ENCOUNTER — Telehealth: Payer: Self-pay | Admitting: Cardiovascular Disease

## 2013-09-10 LAB — MDC_IDC_ENUM_SESS_TYPE_INCLINIC
Battery Remaining Longevity: 44.4 mo
Brady Statistic RA Percent Paced: 21 %
HIGH POWER IMPEDANCE MEASURED VALUE: 69.75 Ohm
Implantable Pulse Generator Model: 3265
Lead Channel Impedance Value: 350 Ohm
Lead Channel Impedance Value: 862.5 Ohm
Lead Channel Pacing Threshold Amplitude: 0.5 V
Lead Channel Pacing Threshold Amplitude: 0.875 V
Lead Channel Sensing Intrinsic Amplitude: 2.9 mV
Lead Channel Sensing Intrinsic Amplitude: 9.9 mV
Lead Channel Setting Pacing Amplitude: 1.5 V
Lead Channel Setting Pacing Amplitude: 3.25 V
Lead Channel Setting Pacing Pulse Width: 0.5 ms
Lead Channel Setting Pacing Pulse Width: 0.8 ms
MDC IDC MSMT LEADCHNL LV PACING THRESHOLD AMPLITUDE: 2.25 V
MDC IDC MSMT LEADCHNL LV PACING THRESHOLD PULSEWIDTH: 0.8 ms
MDC IDC MSMT LEADCHNL RA IMPEDANCE VALUE: 387.5 Ohm
MDC IDC MSMT LEADCHNL RA PACING THRESHOLD PULSEWIDTH: 0.5 ms
MDC IDC MSMT LEADCHNL RV PACING THRESHOLD PULSEWIDTH: 0.5 ms
MDC IDC PG SERIAL: 7037237
MDC IDC SESS DTM: 20150522144640
MDC IDC SET LEADCHNL RV PACING AMPLITUDE: 2 V
MDC IDC SET LEADCHNL RV SENSING SENSITIVITY: 0.5 mV
MDC IDC SET ZONE DETECTION INTERVAL: 300 ms
MDC IDC SET ZONE DETECTION INTERVAL: 340 ms
MDC IDC STAT BRADY RV PERCENT PACED: 98 %

## 2013-09-10 MED ORDER — CARVEDILOL 6.25 MG PO TABS: 6.2500 mg | ORAL_TABLET | Freq: Two times a day (BID) | ORAL | Status: DC

## 2013-09-10 NOTE — Telephone Encounter (Signed)
Rx refill sent to patient pharmacy, patient's wife voiced understanding.

## 2013-09-10 NOTE — Telephone Encounter (Signed)
Pt need a new prescription for his Carvedilol 6.25 mg #60 until his mail order gets here. Please call to [phone].

## 2013-09-13 ENCOUNTER — Encounter: Payer: Self-pay | Admitting: Cardiovascular Disease

## 2013-09-29 ENCOUNTER — Telehealth: Payer: Self-pay | Admitting: Cardiovascular Disease

## 2013-09-29 ENCOUNTER — Encounter: Payer: Self-pay | Admitting: Cardiovascular Disease

## 2013-10-15 NOTE — Telephone Encounter (Signed)
Closed encounter °

## 2013-10-19 ENCOUNTER — Telehealth: Payer: Self-pay | Admitting: Cardiovascular Disease

## 2013-10-19 DIAGNOSIS — Z79899 Other long term (current) drug therapy: Secondary | ICD-10-CM

## 2013-10-19 DIAGNOSIS — R0602 Shortness of breath: Secondary | ICD-10-CM

## 2013-10-19 MED ORDER — ALBUTEROL SULFATE HFA 108 (90 BASE) MCG/ACT IN AERS: 2.0000 | INHALATION_SPRAY | RESPIRATORY_TRACT | Status: DC | PRN

## 2013-10-19 NOTE — Telephone Encounter (Signed)
[name] states that [name] is having a lot of trouble breathing.  He does have an appointment with a lung doctor on 11/05/13, but they were wondering if Dr. Salena Saner. Would prescribe an inhaler for him to use until he can see the lung doctor.

## 2013-10-19 NOTE — Telephone Encounter (Signed)
RN returned call to patient's wife. She is requesting that Dr. Salena Saner prescribe an inhaler for her husband. She states that Dr. Salena Saner ordered a test that told him he had restrictive lung disease and then was referred to pulmonologist and was told he was ok.. (this was 11/2012). She reports that patient breathing so bad that he feels like he will suffocate and he is short of breath at rest and with exertion. RN asked about weight gain and she reports he has gained weight since last OV with Dr. Salena Saner in May but does not have log of weights or idea of current weight. She reports that patient went to PCP and had CXR done and it was OK and didn't show fluid around him heart.   ** patient was supposed to get BMET and BNP after last OV   Wife asked that they be called back on daughter's line [phone]  Will defer to Dr C to advise.   Appmt with Dr. Salena Saner 9/8

## 2013-10-19 NOTE — Telephone Encounter (Signed)
Medication ordered. Labs reordered to be done at [facility] ([employer] Patient will monitor weight and contact office if gains weight/contact office next week with weight trends.

## 2013-10-19 NOTE — Telephone Encounter (Signed)
OK to call in albuterol MDI 2 puffs Q4h prn, but he needs to have the labs done and keep a record of his weight. MCr

## 2013-11-05 DIAGNOSIS — R0602 Shortness of breath: Secondary | ICD-10-CM | POA: Insufficient documentation

## 2013-11-05 LAB — PULMONARY FUNCTION TEST

## 2013-11-09 ENCOUNTER — Telehealth: Payer: Self-pay | Admitting: Cardiovascular Disease

## 2013-11-09 NOTE — Telephone Encounter (Signed)
Pt is not doing good at all,thinks he needs to be seen asap. He has an appointment for 12-18-13. His feet,legs and stomach are swollen,real weak.

## 2013-11-09 NOTE — Telephone Encounter (Signed)
Spoke with pt wife, she reports the pt was seen by dr Meredeth Ide in pulmonary at [facility] and they felt a lot of his problems were his cardiomyopathy. He has increased SOB. His feet, legs and abdomin are swollen. She reports he has trouble breathing just sitting up and he is so weak she has to do everything for him. She does not think he needs to do to the ER as this has been coming on for sometime. appt made for dr c next week.

## 2013-11-12 ENCOUNTER — Ambulatory Visit (INDEPENDENT_AMBULATORY_CARE_PROVIDER_SITE_OTHER): Payer: Medicare HMO | Admitting: Cardiovascular Disease

## 2013-11-12 ENCOUNTER — Encounter: Payer: Self-pay | Admitting: Cardiovascular Disease

## 2013-11-12 ENCOUNTER — Inpatient Hospital Stay (HOSPITAL_COMMUNITY)
Admission: AD | Admit: 2013-11-12 | Discharge: 2013-11-25 | DRG: 291 | Disposition: A | Discharge status: Hospice | Payer: Medicare HMO | Source: Ambulatory Visit | Attending: Cardiology | Admitting: Cardiology

## 2013-11-12 VITALS — BP 112/69 | HR 72 | Ht 73.0 in | Wt 248.2 lb

## 2013-11-12 DIAGNOSIS — R748 Abnormal levels of other serum enzymes: Secondary | ICD-10-CM | POA: Diagnosis present

## 2013-11-12 DIAGNOSIS — I5043 Acute on chronic combined systolic (congestive) and diastolic (congestive) heart failure: Secondary | ICD-10-CM | POA: Diagnosis present

## 2013-11-12 DIAGNOSIS — I2510 Atherosclerotic heart disease of native coronary artery without angina pectoris: Secondary | ICD-10-CM

## 2013-11-12 DIAGNOSIS — Z66 Do not resuscitate: Secondary | ICD-10-CM | POA: Diagnosis present

## 2013-11-12 DIAGNOSIS — E876 Hypokalemia: Secondary | ICD-10-CM | POA: Diagnosis not present

## 2013-11-12 DIAGNOSIS — I447 Left bundle-branch block, unspecified: Secondary | ICD-10-CM

## 2013-11-12 DIAGNOSIS — Z888 Allergy status to other drugs, medicaments and biological substances status: Secondary | ICD-10-CM | POA: Diagnosis not present

## 2013-11-12 DIAGNOSIS — I480 Paroxysmal atrial fibrillation: Secondary | ICD-10-CM

## 2013-11-12 DIAGNOSIS — Z7902 Long term (current) use of antithrombotics/antiplatelets: Secondary | ICD-10-CM

## 2013-11-12 DIAGNOSIS — Z87891 Personal history of nicotine dependence: Secondary | ICD-10-CM

## 2013-11-12 DIAGNOSIS — K7460 Unspecified cirrhosis of liver: Secondary | ICD-10-CM | POA: Diagnosis present

## 2013-11-12 DIAGNOSIS — I4891 Unspecified atrial fibrillation: Secondary | ICD-10-CM

## 2013-11-12 DIAGNOSIS — K921 Melena: Secondary | ICD-10-CM | POA: Diagnosis present

## 2013-11-12 DIAGNOSIS — N3941 Urge incontinence: Secondary | ICD-10-CM | POA: Diagnosis not present

## 2013-11-12 DIAGNOSIS — G9340 Encephalopathy, unspecified: Secondary | ICD-10-CM | POA: Diagnosis present

## 2013-11-12 DIAGNOSIS — Z95810 Presence of automatic (implantable) cardiac defibrillator: Secondary | ICD-10-CM | POA: Diagnosis not present

## 2013-11-12 DIAGNOSIS — Z951 Presence of aortocoronary bypass graft: Secondary | ICD-10-CM | POA: Diagnosis not present

## 2013-11-12 DIAGNOSIS — I4819 Other persistent atrial fibrillation: Secondary | ICD-10-CM

## 2013-11-12 DIAGNOSIS — I25810 Atherosclerosis of coronary artery bypass graft(s) without angina pectoris: Secondary | ICD-10-CM

## 2013-11-12 DIAGNOSIS — Z8249 Family history of ischemic heart disease and other diseases of the circulatory system: Secondary | ICD-10-CM | POA: Diagnosis not present

## 2013-11-12 DIAGNOSIS — I10 Essential (primary) hypertension: Secondary | ICD-10-CM | POA: Diagnosis present

## 2013-11-12 DIAGNOSIS — Z952 Presence of prosthetic heart valve: Secondary | ICD-10-CM | POA: Diagnosis not present

## 2013-11-12 DIAGNOSIS — I428 Other cardiomyopathies: Secondary | ICD-10-CM | POA: Diagnosis present

## 2013-11-12 DIAGNOSIS — D62 Acute posthemorrhagic anemia: Secondary | ICD-10-CM | POA: Diagnosis present

## 2013-11-12 DIAGNOSIS — I482 Chronic atrial fibrillation, unspecified: Secondary | ICD-10-CM

## 2013-11-12 DIAGNOSIS — I5022 Chronic systolic (congestive) heart failure: Secondary | ICD-10-CM

## 2013-11-12 DIAGNOSIS — R443 Hallucinations, unspecified: Secondary | ICD-10-CM | POA: Diagnosis present

## 2013-11-12 DIAGNOSIS — K7689 Other specified diseases of liver: Secondary | ICD-10-CM | POA: Diagnosis present

## 2013-11-12 DIAGNOSIS — R319 Hematuria, unspecified: Secondary | ICD-10-CM | POA: Diagnosis not present

## 2013-11-12 DIAGNOSIS — T82897A Other specified complication of cardiac prosthetic devices, implants and grafts, initial encounter: Secondary | ICD-10-CM | POA: Diagnosis present

## 2013-11-12 DIAGNOSIS — J449 Chronic obstructive pulmonary disease, unspecified: Secondary | ICD-10-CM | POA: Diagnosis present

## 2013-11-12 DIAGNOSIS — R0600 Dyspnea, unspecified: Secondary | ICD-10-CM

## 2013-11-12 DIAGNOSIS — K625 Hemorrhage of anus and rectum: Secondary | ICD-10-CM | POA: Diagnosis present

## 2013-11-12 DIAGNOSIS — I2589 Other forms of chronic ischemic heart disease: Secondary | ICD-10-CM | POA: Diagnosis present

## 2013-11-12 DIAGNOSIS — G9349 Other encephalopathy: Secondary | ICD-10-CM | POA: Insufficient documentation

## 2013-11-12 DIAGNOSIS — J984 Other disorders of lung: Secondary | ICD-10-CM

## 2013-11-12 DIAGNOSIS — I509 Heart failure, unspecified: Secondary | ICD-10-CM

## 2013-11-12 DIAGNOSIS — K766 Portal hypertension: Secondary | ICD-10-CM | POA: Diagnosis present

## 2013-11-12 DIAGNOSIS — R17 Unspecified jaundice: Secondary | ICD-10-CM | POA: Diagnosis present

## 2013-11-12 DIAGNOSIS — Z833 Family history of diabetes mellitus: Secondary | ICD-10-CM

## 2013-11-12 DIAGNOSIS — I959 Hypotension, unspecified: Secondary | ICD-10-CM | POA: Diagnosis not present

## 2013-11-12 DIAGNOSIS — R0989 Other specified symptoms and signs involving the circulatory and respiratory systems: Secondary | ICD-10-CM

## 2013-11-12 DIAGNOSIS — D649 Anemia, unspecified: Secondary | ICD-10-CM | POA: Diagnosis present

## 2013-11-12 DIAGNOSIS — R0609 Other forms of dyspnea: Secondary | ICD-10-CM

## 2013-11-12 HISTORY — DX: Chronic obstructive pulmonary disease, unspecified: J44.9

## 2013-11-12 HISTORY — DX: Acute posthemorrhagic anemia: D62

## 2013-11-12 LAB — MDC_IDC_ENUM_SESS_TYPE_INCLINIC
Battery Remaining Longevity: 42 mo
Date Time Interrogation Session: 20150803191502
HighPow Impedance: 52.875
Implantable Pulse Generator Model: 3265
Implantable Pulse Generator Serial Number: 7037237
Lead Channel Impedance Value: 300 Ohm
Lead Channel Impedance Value: 712.5 Ohm
Lead Channel Pacing Threshold Amplitude: 0.875 V
Lead Channel Sensing Intrinsic Amplitude: 6 mV
Lead Channel Setting Pacing Amplitude: 1.5 V
Lead Channel Setting Pacing Amplitude: 3 V
Lead Channel Setting Pacing Pulse Width: 0.8 ms
MDC IDC MSMT LEADCHNL LV PACING THRESHOLD AMPLITUDE: 2 V
MDC IDC MSMT LEADCHNL LV PACING THRESHOLD PULSEWIDTH: 0.8 ms
MDC IDC MSMT LEADCHNL RA IMPEDANCE VALUE: 375 Ohm
MDC IDC MSMT LEADCHNL RA PACING THRESHOLD AMPLITUDE: 0.5 V
MDC IDC MSMT LEADCHNL RA PACING THRESHOLD PULSEWIDTH: 0.5 ms
MDC IDC MSMT LEADCHNL RA SENSING INTR AMPL: 2.8 mV
MDC IDC MSMT LEADCHNL RV PACING THRESHOLD PULSEWIDTH: 0.5 ms
MDC IDC SET LEADCHNL RV PACING AMPLITUDE: 2 V
MDC IDC SET LEADCHNL RV PACING PULSEWIDTH: 0.5 ms
MDC IDC SET LEADCHNL RV SENSING SENSITIVITY: 0.5 mV
MDC IDC SET ZONE DETECTION INTERVAL: 340 ms
MDC IDC STAT BRADY RA PERCENT PACED: 5.9 %
MDC IDC STAT BRADY RV PERCENT PACED: 91 %
Zone Setting Detection Interval: 300 ms

## 2013-11-12 LAB — CBC WITH DIFFERENTIAL/PLATELET
BASOS PCT: 0 % (ref 0–1)
Basophils Absolute: 0 10*3/uL (ref 0.0–0.1)
EOS ABS: 0 10*3/uL (ref 0.0–0.7)
Eosinophils Relative: 0 % (ref 0–5)
HEMATOCRIT: 24.4 % — AB (ref 39.0–52.0)
Hemoglobin: 6.3 g/dL — CL (ref 13.0–17.0)
Lymphocytes Relative: 8 % — ABNORMAL LOW (ref 12–46)
Lymphs Abs: 0.8 10*3/uL (ref 0.7–4.0)
MCH: 16.7 pg — ABNORMAL LOW (ref 26.0–34.0)
MCHC: 25.8 g/dL — AB (ref 30.0–36.0)
MCV: 64.7 fL — AB (ref 78.0–100.0)
MONOS PCT: 6 % (ref 3–12)
Monocytes Absolute: 0.6 10*3/uL (ref 0.1–1.0)
NEUTROS ABS: 8.9 10*3/uL — AB (ref 1.7–7.7)
Neutrophils Relative %: 86 % — ABNORMAL HIGH (ref 43–77)
Platelets: 375 10*3/uL (ref 150–400)
RBC: 3.77 MIL/uL — ABNORMAL LOW (ref 4.22–5.81)
RDW: 20.5 % — ABNORMAL HIGH (ref 11.5–15.5)
WBC: 10.3 10*3/uL (ref 4.0–10.5)

## 2013-11-12 LAB — URINALYSIS, ROUTINE W REFLEX MICROSCOPIC
Bilirubin Urine: NEGATIVE
GLUCOSE, UA: NEGATIVE mg/dL
Hgb urine dipstick: NEGATIVE
Ketones, ur: NEGATIVE mg/dL
Leukocytes, UA: NEGATIVE
Nitrite: NEGATIVE
PH: 5.5 (ref 5.0–8.0)
Protein, ur: NEGATIVE mg/dL
Specific Gravity, Urine: 1.012 (ref 1.005–1.030)
Urobilinogen, UA: 1 mg/dL (ref 0.0–1.0)

## 2013-11-12 LAB — COMPREHENSIVE METABOLIC PANEL
ALK PHOS: 217 U/L — AB (ref 39–117)
ALT: 87 U/L — ABNORMAL HIGH (ref 0–53)
ANION GAP: 13 (ref 5–15)
AST: 57 U/L — ABNORMAL HIGH (ref 0–37)
Albumin: 3 g/dL — ABNORMAL LOW (ref 3.5–5.2)
BUN: 28 mg/dL — AB (ref 6–23)
CHLORIDE: 94 meq/L — AB (ref 96–112)
CO2: 29 mEq/L (ref 19–32)
Calcium: 8.8 mg/dL (ref 8.4–10.5)
Creatinine, Ser: 1.19 mg/dL (ref 0.50–1.35)
GFR calc non Af Amer: 60 mL/min — ABNORMAL LOW (ref >90)
GFR, EST AFRICAN AMERICAN: 69 mL/min — AB (ref >90)
GLUCOSE: 131 mg/dL — AB (ref 70–99)
Potassium: 3.5 mEq/L — ABNORMAL LOW (ref 3.7–5.3)
Sodium: 136 mEq/L — ABNORMAL LOW (ref 137–147)
Total Bilirubin: 2.8 mg/dL — ABNORMAL HIGH (ref 0.3–1.2)
Total Protein: 6.5 g/dL (ref 6.0–8.3)

## 2013-11-12 LAB — TSH: TSH: 7.07 u[IU]/mL — AB (ref 0.350–4.500)

## 2013-11-12 LAB — PROTIME-INR
INR: 1.33 (ref 0.00–1.49)
Prothrombin Time: 16.5 seconds — ABNORMAL HIGH (ref 11.6–15.2)

## 2013-11-12 LAB — PREPARE RBC (CROSSMATCH)

## 2013-11-12 LAB — PRO B NATRIURETIC PEPTIDE: Pro B Natriuretic peptide (BNP): 3747 pg/mL — ABNORMAL HIGH (ref 0–125)

## 2013-11-12 LAB — TROPONIN I: Troponin I: <0.3 ng/mL (ref <0.30)

## 2013-11-12 LAB — MAGNESIUM: Magnesium: 2.3 mg/dL (ref 1.5–2.5)

## 2013-11-12 LAB — MRSA PCR SCREENING: MRSA BY PCR: NEGATIVE

## 2013-11-12 MED ORDER — CLOPIDOGREL BISULFATE 75 MG PO TABS
75.0000 mg | ORAL_TABLET | Freq: Every day | ORAL | Status: DC
  Administered 2013-11-12 – 2013-11-25 (×14): 75 mg via ORAL
  Filled 2013-11-12 (×14): qty 1

## 2013-11-12 MED ORDER — FUROSEMIDE 10 MG/ML IJ SOLN
80.0000 mg | Freq: Once | INTRAMUSCULAR | Status: AC
  Administered 2013-11-13: 80 mg via INTRAVENOUS
  Filled 2013-11-12: qty 8

## 2013-11-12 MED ORDER — SODIUM CHLORIDE 0.9 % IV SOLN
Freq: Once | INTRAVENOUS | Status: AC
  Administered 2013-11-12: 20:00:00 via INTRAVENOUS

## 2013-11-12 MED ORDER — FOLIC ACID 1 MG PO TABS
1.0000 mg | ORAL_TABLET | Freq: Every day | ORAL | Status: DC
  Administered 2013-11-12 – 2013-11-24 (×13): 1 mg via ORAL
  Filled 2013-11-12 (×13): qty 1

## 2013-11-12 MED ORDER — ASPIRIN EC 81 MG PO TBEC
81.0000 mg | DELAYED_RELEASE_TABLET | Freq: Every day | ORAL | Status: DC
  Administered 2013-11-12 – 2013-11-15 (×4): 81 mg via ORAL
  Filled 2013-11-12 (×4): qty 1

## 2013-11-12 MED ORDER — HEPARIN (PORCINE) IN NACL 100-0.45 UNIT/ML-% IJ SOLN
1200.0000 [IU]/h | INTRAMUSCULAR | Status: DC
  Administered 2013-11-12: 1200 [IU]/h via INTRAVENOUS
  Filled 2013-11-12: qty 250

## 2013-11-12 MED ORDER — CETYLPYRIDINIUM CHLORIDE 0.05 % MT LIQD
7.0000 mL | Freq: Two times a day (BID) | OROMUCOSAL | Status: DC
  Administered 2013-11-13 – 2013-11-24 (×20): 7 mL via OROMUCOSAL

## 2013-11-12 MED ORDER — CARVEDILOL 6.25 MG PO TABS
6.2500 mg | ORAL_TABLET | Freq: Two times a day (BID) | ORAL | Status: DC
  Administered 2013-11-12 – 2013-11-25 (×24): 6.25 mg via ORAL
  Filled 2013-11-12 (×28): qty 1

## 2013-11-12 MED ORDER — PANTOPRAZOLE SODIUM 40 MG PO TBEC
40.0000 mg | DELAYED_RELEASE_TABLET | Freq: Every day | ORAL | Status: DC
  Administered 2013-11-13 – 2013-11-14 (×2): 40 mg via ORAL
  Filled 2013-11-12 (×2): qty 1

## 2013-11-12 MED ORDER — FERROUS SULFATE 325 (65 FE) MG PO TABS
325.0000 mg | ORAL_TABLET | Freq: Two times a day (BID) | ORAL | Status: DC
  Administered 2013-11-12 – 2013-11-24 (×24): 325 mg via ORAL
  Filled 2013-11-12 (×28): qty 1

## 2013-11-12 MED ORDER — ALBUTEROL SULFATE (2.5 MG/3ML) 0.083% IN NEBU: 3.0000 mL | INHALATION_SOLUTION | RESPIRATORY_TRACT | Status: DC | PRN

## 2013-11-12 MED ORDER — FUROSEMIDE 10 MG/ML IJ SOLN
80.0000 mg | Freq: Two times a day (BID) | INTRAMUSCULAR | Status: DC
  Administered 2013-11-12 – 2013-11-17 (×10): 80 mg via INTRAVENOUS
  Filled 2013-11-12 (×12): qty 8

## 2013-11-12 MED ORDER — HEPARIN BOLUS VIA INFUSION
2000.0000 [IU] | Freq: Once | INTRAVENOUS | Status: AC
  Administered 2013-11-12: 2000 [IU] via INTRAVENOUS
  Filled 2013-11-12: qty 2000

## 2013-11-12 NOTE — Progress Notes (Signed)
ANTICOAGULATION CONSULT NOTE - Initial Consult  Pharmacy Consult for heparin Indication: atrial fibrillation  Allergies  Allergen Reactions  . Ace Inhibitors Cough    Patient Measurements: Height: 6\' 1"  (185.4 cm) Weight: 246 lb 7.6 oz (111.8 kg) IBW/kg (Calculated) : 79.9 Heparin Dosing Weight: 90 kg  Vital Signs: BP: 112/69 mmHg (08/03 1514) Pulse Rate: 72 (08/03 1514)  Labs: No results found for this basename: HGB, HCT, PLT, APTT, LABPROT, INR, HEPARINUNFRC, CREATININE, CKTOTAL, CKMB, TROPONINI,  in the last 72 hours  Estimated Creatinine Clearance: 63 ml/min (by C-G formula based on Cr of 1.41).   Medical History: Past Medical History  Diagnosis Date  . Nonischemic cardiomyopathy     EF 25-30%  . Chronic systolic dysfunction of left ventricle   . Valvular cardiomyopathy   . Aortic valve replaced     x2 2002 & 2009 Medtronic free style aortic root  . CAD (coronary artery disease)     s/p CABG  . LBBB (left bundle branch block)     QRS  . CHF (congestive heart failure)   . Shortness of breath   . Hypertension   . 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  His baseline labs are pending.  He was on no anticoagulants PTA but is having some jaundice.  His INR was 1.0 on 09/12/12.  Goal of Therapy:  Heparin level 0.3-0.7 units/ml Monitor platelets by anticoagulation protocol: Yes   Plan:  Heparin bolus 2000 units IV X 1.  (Smaller bolus due to jaundice and baseline coags may be elevated) Heparin drip at 1200 units/hr Check heparin level in 6 hours F/u baseline labs. CBC and heparin level daily while on heparin.  Thanks for allowing pharmacy to be a part of this patient's care.  Talbert Cage, PharmD Clinical Pharmacist, [phone] 11/12/2013,5:38 PM

## 2013-11-12 NOTE — Progress Notes (Signed)
CRITICAL VALUE ALERT  Critical value received:  Hemoglobin 6.3  Date of notification:  11/12/2013  Time of notification:  1905  Critical value read back: Yes  Nurse who received alert:  [place] Malena Timpone RN  MD notified (1st page):  PA Boyce Medici   Time of first page:  1905  Time MD responded:  [phone]

## 2013-11-12 NOTE — Assessment & Plan Note (Addendum)
Patient appears to be acute CHF exacerbation at this time with volume overload evidenced on exam. On the interrogation of his cardioverter-defibrillator device it would appear he went in to exacerbation around July 4th. At this time he needs help with diuresis given the extent of his volume overload. We will admit him to the hospital for diuresis with IV lasix. Will additionally obtain chest x-ray to determine extent of volume overload. Will monitor chemistry for renal function while diuresing. Will obtain BNP. We discussed the expected length of stay in the hospital.

## 2013-11-12 NOTE — Assessment & Plan Note (Addendum)
Patient with onset of hallucinations over the past several weeks accompanied by jaundice, easy bruising, and increased abdominal distension. This constellation of symptoms raises the concern of a hepatic issue. The question is whether or not this is related to a primary hepatic issue vs right heart failure leading to a hepatic insult. On review of his most recent heart cath it appeared that he had normal right heart pressures. He will need additional work-up of the cause of his apparent hepatic dysfunction while in the hospital. Will obtain liver function testing. Would consider ammonia level. Would obtain a UA as well. Could consider hepatic imaging pending results of blood work.

## 2013-11-12 NOTE — H&P (Signed)
Patient ID: [name], male DOB: 05/16/1940, 73 y.o. MRN: [id number]   Reason for office visit  Presents for f/u of CHF and CRT-D.   Patient presents for follow-up today. He reports he has felt poorly over the past month. He reports he has had chest pain for the past 6 months. It feels as though someone is sitting on his chest. There is dyspnea with this. It does not radiate. His wife note orthopnea and PND, though patient declines this. Patient notes he has not increased the amount of lasix he has taken since he last saw Dr Royann Shivers. His weight has been stable since his last visit.  Patient additionally notes that he has had increased swelling in his legs and distension of his belly. This has been increasing over the past several weeks. He has noted yellowing of his skin and increased bruising over the past week. He notes he was seen 2-3 weeks ago by Dr Venia Carbon, PCP, for orange and smelly urine, though notes he was told that he did not have an infection. He additionally notes hallucinations over the past month. At first he saw nail holes in the wall and more recently he has seen smoke rising from various objects.  Patient additionally notes he has been falling. No syncope. Some dizziness with this. Says his legs are weak and can't hold him up. Notes light headedness for 6-8 months. A couple of times a day this occurs. Last time he fell he couldn't get legs under himself.  On interrogation of his CRT-D device it appears he has been in afib for the past 7 days. Bi-V pacing has decreased since been in Afib. His thoracic impedance has decreased as well, starting at the beginning of July.  No history of liver issues. 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 1-2 drinks a day. Not sure how much alcohol is in each drink. Previously would just drink on the weekends. Unsure how much alcohol intake there was.   Allergies   Allergen  Reactions   .  Ace Inhibitors  Cough    Current Outpatient  Prescriptions   Medication  Sig  Dispense  Refill   .  albuterol (PROVENTIL HFA;VENTOLIN HFA) 108 (90 BASE) MCG/ACT inhaler  Inhale 2 puffs into the lungs every 4 (four) hours as needed for wheezing or shortness of breath.  1 Inhaler  1   .  allopurinol (ZYLOPRIM) 300 MG tablet  Take 300 mg by mouth daily.     Marland Kitchen  amitriptyline (ELAVIL) 25 MG tablet  Take 25 mg by mouth at bedtime.     Marland Kitchen  atorvastatin (LIPITOR) 20 MG tablet  Take 20 mg by mouth daily.     .  carvedilol (COREG) 6.25 MG tablet  Take 1 tablet (6.25 mg total) by mouth 2 (two) times daily with a meal.  60 tablet  0   .  clopidogrel (PLAVIX) 75 MG tablet  Take 75 mg by mouth daily.     .  famotidine (PEPCID) 20 MG tablet  One at bedtime  30 tablet  2   .  Ferrous Sulfate (IRON) 28 MG TABS  Take 28 mg by mouth daily.     .  folic acid (FOLVITE) 800 MCG tablet  Take 800 mcg by mouth daily.     .  furosemide (LASIX) 40 MG tablet  Take 1 tablet (40 mg total) by mouth daily. Take 40mg  daily except 60mg  Mondays/Wednesdays/Fridays.  120 tablet  3   .  Multiple Vitamin (MULTIVITAMIN) tablet  Take 1 tablet by mouth daily.     .  pantoprazole (PROTONIX) 40 MG tablet  Take 1 tablet (40 mg total) by mouth daily. Take 30-60 min before first meal of the day  30 tablet  2    No current facility-administered medications for this visit.    Past Medical History   Diagnosis  Date   .  Nonischemic cardiomyopathy      EF 25-30%   .  Chronic systolic dysfunction of left ventricle    .  Valvular cardiomyopathy    .  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  CAD (coronary artery disease)      s/p CABG   .  LBBB (left bundle branch block)      QRS 180msec   .  CHF (congestive heart failure)    .  Shortness of breath    .  Hypertension    .  Biventricular ICD (implantable cardioverter-defibrillator) in place  08/2011     [facility]    Past Surgical History   Procedure  Laterality  Date   .  Aortic valve  replacement       s/p 2 separate AVR procedures, an initial mechanical AVR was replaced with a Medtronic Freestyle root in 2009 with reimplantation of the cors as well as redo SVG to RCA and preservation of LIMA to LAD   .  Coronary artery bypass graft       2002   .  Biv icd implant   08/16/11     SJM BiV ICD implanted by Dr Johney FrameAllred   .  Cardiac valve replacement      Family History   Problem  Relation  Age of Onset   .  Diabetes  Mother    .  Hypertension  Mother    .  Heart attack  Father    .  Heart attack  Brother    .  Diabetes  Brother    .  Cancer  Sister    .  Diabetes  Sister    .  Hypertension  Sister     History    Social History   .  Marital Status:  Married     Spouse Name:  N/A     Number of Children:  N/A   .  Years of Education:  N/A    Occupational History   .  retired     Social History Main Topics   .  Smoking status:  Former Smoker -- 1.50 packs/day for 36 years     Types:  Cigarettes     Quit date:  04/12/1992   .  Smokeless tobacco:  Never Used   .  Alcohol Use:  Yes      Comment: moderate   .  Drug Use:  No   .  Sexual Activity:  Not on file    Other Topics  Concern   .  Not on file    Social History Narrative   .  No narrative on file   Review of systems:  Patient endorses chest pressure, dyspnea, fatigue, jaundice, orthopnea, PND, dizziness, history of falls, diminished memory, abdominal distension, lower extremity edema, change in urine color and smell. He denies syncope.  PHYSICAL EXAM  BP 112/69  Pulse 72  Ht 6\' 1"  (1.854 m)  Wt 248 lb 3.2 oz (112.583 kg)  BMI  32.75 kg/m2  General: jaundiced, fatigued appearing  Head: scleral icterus, no evidence of trauma, PERRL, EOMI; normal ears, nose and oropharynx  Neck: brisk carotid pulses without delay and no carotid bruits  Chest: bibasilar crackles present, no increased work of breathing, symmetrical and full respiratory excursions; sternotomy scar; healed subclavian pacemaker site    Cardiovascular: irregularly irregular, no murmurs, rubs or gallops  Abdomen: distended, no tenderness, liver palpated significantly below the costal margin, no other masses, no evident ascites, there is apparent edema in abdomen  Extremities: 3+ pitting edema to mid thigh; 2+ radial pulses bilaterally  Neurological: grossly nonfocal, note difficulty in recalling previous details  EKG: paced rhythm, LBBB, rate of 72  Lipid Panel  No results found for this basename: chol, trig, hdl, cholhdl, vldl, ldlcalc   BMET    Component  Value  Date/Time    NA  135  09/18/2012 1049    K  4.2  09/18/2012 1049    CL  101  09/18/2012 1049    CO2  23  09/18/2012 1049    GLUCOSE  115*  09/18/2012 1049    BUN  21  09/18/2012 1049    CREATININE  1.41*  09/18/2012 1049    CREATININE  1.64*  09/12/2012 1510    CALCIUM  9.2  09/18/2012 1049    GFRNONAA  49*  09/18/2012 1049    GFRNONAA  40*  09/08/2012 1545    GFRAA  57*  09/18/2012 1049    GFRAA  47*  09/08/2012 1545   ASSESSMENT AND PLAN  Chronic systolic CHF (congestive heart failure)  Patient appears to be acute CHF exacerbation at this time with volume overload evidenced on exam. On the interrogation of his cardioverter-defibrillator device it would appear he went in to exacerbation around July 4th. At this time he needs help with diuresis given the extent of his volume overload. We will admit him to the hospital for diuresis with IV lasix. Will additionally obtain chest x-ray to determine extent of volume overload. Will monitor chemistry for renal function while diuresing. Will obtain BNP. We discussed the expected length of stay in the hospital.  Atrial fibrillation  Onset 7 days ago and detected on interrogation of his cardioverter-defibrillator. Likely a result of patient being volume overloaded at this time. Discussed with patient the need to diurese fluid at this time prior to considering TEE and cardioversion. Once more stable will plan for TEE to determine thrombus status  and need for anticoagulation.  Other encephalopathy  Patient with onset of hallucinations over the past several weeks accompanied by jaundice, easy bruising, and increased abdominal distension. This constellation of symptoms raises the concern of a hepatic issue. The question is whether or not this is related to a primary hepatic issue vs right heart failure leading to a hepatic insult. On review of his most recent heart cath it appeared that he had normal right heart pressures. He will need additional work-up of the cause of his apparent hepatic dysfunction while in the hospital. Will obtain liver function testing. Would consider ammonia level. Would obtain a UA as well. Could consider hepatic imaging pending results of blood work.  Patient to be admitted to the hospital for further care.  Marikay Alar, MD  [facility] PGY-3  Thurmon Fair, MD, [facility]  [phone] office  [phone] pager  I have seen and examined the patient along with Marikay Alar, MD. I have reviewed the chart, notes and new data.  I agree with his note.  Mr. Schumm has extensive history of structural heart disease including severely depressed left ventricular systolic function, coronary artery disease and heart disease. He has had 2 separate aortic valve replacement procedures most recently a root replacement with a Medtronic freestyle root conduits and coronary reimplantation. He received a CRT-D device. He has persistent and severe shortness of breath with NYHA functional class 3-4 status persistently, despite aggressive medical therapy. His symptoms of shortness of breath appear to be out of proportion when compared to the findings on radiological, biochemical and thoracic impedance evaluation of his heart failure.  Coronary angiography in January of 2013 showed a 80% stenosis of the ostium of the saphenous vein graft to the right coronary artery that could not explain the severity of his  cardiomyopathy. The discrepancy between the severity of his shortness of breath and the clinical findings lead to repeat right heart catheterization performed on June 9. He was actually found to be profoundly hypovolemic with a pulmonary artery wedge pressure of only 1 mm Hg. His cardiac output was low. We reduced the dose of his diuretics and he feels better.  Lung function testing was then performed and shows evidence of moderate restrictive lung disease. His forced vital capacity is about 50% of normal and there is a comments root reduction in his FEV1 and diffusion capacity. The total lung capacity is less severely reduced at about 72% of predicted. He used to work in a [employer] and in [employer] but has not had a history of significant occupational exposure to asbestos or other causes of occupational lung disease.   Key new complaints: he has jaundice, encephalopathy, profound fatigue, orthopnea, abdominal distention and massive lower extremity edema to the groin  Key examination changes: probable ascites, hepatomegaly > 8 cm below costal margin, pallor, elevated JVP, probable right pleural effusion  Key new findings / data: reduced thoracic impedance c/w acute CHF for approx 1 month, new onset persistent AF for about 7 and 1/2 days, BiV pacing reduced to 91%.   PLAN:  Admit for intravenous diuretics.  Suspect has hepatic dysfunction (encephalopathy and jaundice) due to passive congestion, but need to consider alcohol use and drug toxicity (statin, allopurinol) as well.  Start IV heparin for AF. Depending on liver/renal tests may then choose warfarin or NOAC.  Premature to perform cardioversion now, but will need TEE guided cardioversion before discharge.  Note that signs and symptoms of acute HF and decrease in thoracic impedance preceded the onset of atrial fibrillation by as much as 3 weeks. I'd likelihood of atrial fibrillation recurrence if cardioversion is performed before heart failure  is compensated  He appears to be severely ill.   Thurmon Fair, MD, [facility]  [facility]  [phone]  11/12/2013, 5:35 PM

## 2013-11-12 NOTE — Progress Notes (Addendum)
Patient ID: [name], male   DOB: Sep 10, 1940, 73 y.o.   MRN: [id number]  I was notified that the patient's hemoglobin is 6.3. I've spoken with the patient's family. They relate the fact that he has blood per rectum every time he has a bowel movement. They say he has refused to have anything done about it. There has been no documented upper GI bleeding. It is outlined in the history and physical that the patient is volume overloaded. He still has rales. His O2 sat is 100% on 4 L. Currently he has begun to diurese with IV Lasix. We have ordered to type and cross 2 units of blood. I've requested that these 2 units be transfused slowly. I am hopeful that with increasing hemoglobin and diuresis he will tolerate the transfusion. If there is any sign of increasing CHF, the transfusion will be temporarily stopped after one unit. I have ordered additional 80 mg of Lasix to be given after his first unit of blood has been transfused.   Heparin was started earlier today for other reasons. Even though there is no obvious acute bleed at this time, I decided to discontinue the heparin until his hemoglobin is more stable.

## 2013-11-12 NOTE — Assessment & Plan Note (Signed)
Onset 7 days ago and detected on interrogation of his cardioverter-defibrillator. Likely a result of patient being volume overloaded at this time. Discussed with patient the need to diurese fluid at this time prior to considering TEE and cardioversion. Once more stable will plan for TEE to determine thrombus status and need for anticoagulation.

## 2013-11-12 NOTE — Progress Notes (Signed)
Patient ID: [name], male DOB: 06/16/1940, 73 y.o. MRN: [id number]   Reason for office visit  Presents for f/u of CHF and CRT-D.   Patient presents for follow-up today. He reports he has felt poorly over the past month. He reports he has had chest pain for the past 6 months. It feels as though someone is sitting on his chest. There is dyspnea with this. It does not radiate. His wife note orthopnea and PND, though patient declines this. Patient notes he has not increased the amount of lasix he has taken since he last saw Dr Croitoru. His weight has been stable since his last visit.  Patient additionally notes that he has had increased swelling in his legs and distension of his belly. This has been increasing over the past several weeks. He has noted yellowing of his skin and increased bruising over the past week. He notes he was seen 2-3 weeks ago by Dr Philips, PCP, for orange and smelly urine, though notes he was told that he did not have an infection. He additionally notes hallucinations over the past month. At first he saw nail holes in the wall and more recently he has seen smoke rising from various objects.  Patient additionally notes he has been falling. No syncope. Some dizziness with this. Says his legs are weak and can't hold him up. Notes light headedness for 6-8 months. A couple of times a day this occurs. Last time he fell he couldn't get legs under himself.  On interrogation of his CRT-D device it appears he has been in afib for the past 7 days. Bi-V pacing has decreased since been in Afib. His thoracic impedance has decreased as well, starting at the beginning of July.  No history of liver issues. 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 1-2 drinks a day. Not sure how much alcohol is in each drink. Previously would just drink on the weekends. Unsure how much alcohol intake there was.   Allergies   Allergen  Reactions   .  Ace Inhibitors  Cough    Current Outpatient  Prescriptions   Medication  Sig  Dispense  Refill   .  albuterol (PROVENTIL HFA;VENTOLIN HFA) 108 (90 BASE) MCG/ACT inhaler  Inhale 2 puffs into the lungs every 4 (four) hours as needed for wheezing or shortness of breath.  1 Inhaler  1   .  allopurinol (ZYLOPRIM) 300 MG tablet  Take 300 mg by mouth daily.     .  amitriptyline (ELAVIL) 25 MG tablet  Take 25 mg by mouth at bedtime.     .  atorvastatin (LIPITOR) 20 MG tablet  Take 20 mg by mouth daily.     .  carvedilol (COREG) 6.25 MG tablet  Take 1 tablet (6.25 mg total) by mouth 2 (two) times daily with a meal.  60 tablet  0   .  clopidogrel (PLAVIX) 75 MG tablet  Take 75 mg by mouth daily.     .  famotidine (PEPCID) 20 MG tablet  One at bedtime  30 tablet  2   .  Ferrous Sulfate (IRON) 28 MG TABS  Take 28 mg by mouth daily.     .  folic acid (FOLVITE) 800 MCG tablet  Take 800 mcg by mouth daily.     .  furosemide (LASIX) 40 MG tablet  Take 1 tablet (40 mg total) by mouth daily. Take 40mg daily except 60mg Mondays/Wednesdays/Fridays.  120 tablet  3   .    Multiple Vitamin (MULTIVITAMIN) tablet  Take 1 tablet by mouth daily.     .  pantoprazole (PROTONIX) 40 MG tablet  Take 1 tablet (40 mg total) by mouth daily. Take 30-60 min before first meal of the day  30 tablet  2    No current facility-administered medications for this visit.    Past Medical History   Diagnosis  Date   .  Nonischemic cardiomyopathy      EF 25-30%   .  Chronic systolic dysfunction of left ventricle    .  Valvular cardiomyopathy    .  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  CAD (coronary artery disease)      s/p CABG   .  LBBB (left bundle branch block)      QRS 180msec   .  CHF (congestive heart failure)    .  Shortness of breath    .  Hypertension    .  Biventricular ICD (implantable cardioverter-defibrillator) in place  08/2011     [facility]    Past Surgical History   Procedure  Laterality  Date   .  Aortic valve  replacement       s/p 2 separate AVR procedures, an initial mechanical AVR was replaced with a Medtronic Freestyle root in 2009 with reimplantation of the cors as well as redo SVG to RCA and preservation of LIMA to LAD   .  Coronary artery bypass graft       2002   .  Biv icd implant   08/16/11     SJM BiV ICD implanted by Dr Allred   .  Cardiac valve replacement      Family History   Problem  Relation  Age of Onset   .  Diabetes  Mother    .  Hypertension  Mother    .  Heart attack  Father    .  Heart attack  Brother    .  Diabetes  Brother    .  Cancer  Sister    .  Diabetes  Sister    .  Hypertension  Sister     History    Social History   .  Marital Status:  Married     Spouse Name:  N/A     Number of Children:  N/A   .  Years of Education:  N/A    Occupational History   .  retired     Social History Main Topics   .  Smoking status:  Former Smoker -- 1.50 packs/day for 36 years     Types:  Cigarettes     Quit date:  04/12/1992   .  Smokeless tobacco:  Never Used   .  Alcohol Use:  Yes      Comment: moderate   .  Drug Use:  No   .  Sexual Activity:  Not on file    Other Topics  Concern   .  Not on file    Social History Narrative   .  No narrative on file   Review of systems:  Patient endorses chest pressure, dyspnea, fatigue, jaundice, orthopnea, PND, dizziness, history of falls, diminished memory, abdominal distension, lower extremity edema, change in urine color and smell. He denies syncope.  PHYSICAL EXAM  BP 112/69  Pulse 72  Ht 6' 1" (1.854 m)  Wt 248 lb 3.2 oz (112.583 kg)  BMI   32.75 kg/m2  General: jaundiced, fatigued appearing  Head: scleral icterus, no evidence of trauma, PERRL, EOMI; normal ears, nose and oropharynx  Neck: brisk carotid pulses without delay and no carotid bruits  Chest: bibasilar crackles present, no increased work of breathing, symmetrical and full respiratory excursions; sternotomy scar; healed subclavian pacemaker site    Cardiovascular: irregularly irregular, no murmurs, rubs or gallops  Abdomen: distended, no tenderness, liver palpated significantly below the costal margin, no other masses, no evident ascites, there is apparent edema in abdomen  Extremities: 3+ pitting edema to mid thigh; 2+ radial pulses bilaterally  Neurological: grossly nonfocal, note difficulty in recalling previous details  EKG: paced rhythm, LBBB, rate of 72  Lipid Panel  No results found for this basename: chol, trig, hdl, cholhdl, vldl, ldlcalc   BMET    Component  Value  Date/Time    NA  135  09/18/2012 1049    K  4.2  09/18/2012 1049    CL  101  09/18/2012 1049    CO2  23  09/18/2012 1049    GLUCOSE  115*  09/18/2012 1049    BUN  21  09/18/2012 1049    CREATININE  1.41*  09/18/2012 1049    CREATININE  1.64*  09/12/2012 1510    CALCIUM  9.2  09/18/2012 1049    GFRNONAA  49*  09/18/2012 1049    GFRNONAA  40*  09/08/2012 1545    GFRAA  57*  09/18/2012 1049    GFRAA  47*  09/08/2012 1545   ASSESSMENT AND PLAN  Chronic systolic CHF (congestive heart failure)  Patient appears to be acute CHF exacerbation at this time with volume overload evidenced on exam. On the interrogation of his cardioverter-defibrillator device it would appear he went in to exacerbation around July 4th. At this time he needs help with diuresis given the extent of his volume overload. We will admit him to the hospital for diuresis with IV lasix. Will additionally obtain chest x-ray to determine extent of volume overload. Will monitor chemistry for renal function while diuresing. Will obtain BNP. We discussed the expected length of stay in the hospital.  Atrial fibrillation  Onset 7 days ago and detected on interrogation of his cardioverter-defibrillator. Likely a result of patient being volume overloaded at this time. Discussed with patient the need to diurese fluid at this time prior to considering TEE and cardioversion. Once more stable will plan for TEE to determine thrombus status  and need for anticoagulation.  Other encephalopathy  Patient with onset of hallucinations over the past several weeks accompanied by jaundice, easy bruising, and increased abdominal distension. This constellation of symptoms raises the concern of a hepatic issue. The question is whether or not this is related to a primary hepatic issue vs right heart failure leading to a hepatic insult. On review of his most recent heart cath it appeared that he had normal right heart pressures. He will need additional work-up of the cause of his apparent hepatic dysfunction while in the hospital. Will obtain liver function testing. Would consider ammonia level. Would obtain a UA as well. Could consider hepatic imaging pending results of blood work.  Patient to be admitted to the hospital for further care.  Cana Mignano, MD  [facility] PGY-3  Mihai Croitoru, MD, FACC  [facility]  [phone] office  [phone] pager  I have seen and examined the patient along with Yanira Tolsma, MD. I have reviewed the chart, notes and new data.   I agree with his note.  Mr. Staub has extensive history of structural heart disease including severely depressed left ventricular systolic function, coronary artery disease and heart disease. He has had 2 separate aortic valve replacement procedures most recently a root replacement with a Medtronic freestyle root conduits and coronary reimplantation. He received a CRT-D device. He has persistent and severe shortness of breath with NYHA functional class 3-4 status persistently, despite aggressive medical therapy. His symptoms of shortness of breath appear to be out of proportion when compared to the findings on radiological, biochemical and thoracic impedance evaluation of his heart failure.  Coronary angiography in January of 2013 showed a 80% stenosis of the ostium of the saphenous vein graft to the right coronary artery that could not explain the severity of his  cardiomyopathy. The discrepancy between the severity of his shortness of breath and the clinical findings lead to repeat right heart catheterization performed on June 9. He was actually found to be profoundly hypovolemic with a pulmonary artery wedge pressure of only 1 mm Hg. His cardiac output was low. We reduced the dose of his diuretics and he feels better.  Lung function testing was then performed and shows evidence of moderate restrictive lung disease. His forced vital capacity is about 50% of normal and there is a comments root reduction in his FEV1 and diffusion capacity. The total lung capacity is less severely reduced at about 72% of predicted. He used to work in a textile mill and in construction but has not had a history of significant occupational exposure to asbestos or other causes of occupational lung disease.   Key new complaints: he has jaundice, encephalopathy, profound fatigue, orthopnea, abdominal distention and massive lower extremity edema to the groin  Key examination changes: probable ascites, hepatomegaly > 8 cm below costal margin, pallor, elevated JVP, probable right pleural effusion  Key new findings / data: reduced thoracic impedance c/w acute CHF for approx 1 month, new onset persistent AF for about 7 and 1/2 days, BiV pacing reduced to 91%.   PLAN:  Admit for intravenous diuretics.  Suspect has hepatic dysfunction (encephalopathy and jaundice) due to passive congestion, but need to consider alcohol use and drug toxicity (statin, allopurinol) as well.  Start IV heparin for AF. Depending on liver/renal tests may then choose warfarin or NOAC.  Premature to perform cardioversion now, but will need TEE guided cardioversion before discharge.  Note that signs and symptoms of acute HF and decrease in thoracic impedance preceded the onset of atrial fibrillation by as much as 3 weeks. I'd likelihood of atrial fibrillation recurrence if cardioversion is performed before heart failure  is compensated  He appears to be severely ill.   Mihai Croitoru, MD, FACC  [facility]  [phone]  11/12/2013, 5:35 PM  

## 2013-11-13 ENCOUNTER — Encounter (HOSPITAL_COMMUNITY): Payer: Self-pay | Admitting: *Deleted

## 2013-11-13 ENCOUNTER — Inpatient Hospital Stay (HOSPITAL_COMMUNITY): Payer: Medicare HMO

## 2013-11-13 DIAGNOSIS — Z954 Presence of other heart-valve replacement: Secondary | ICD-10-CM

## 2013-11-13 DIAGNOSIS — I059 Rheumatic mitral valve disease, unspecified: Secondary | ICD-10-CM

## 2013-11-13 LAB — TROPONIN I: Troponin I: <0.3 ng/mL (ref <0.30)

## 2013-11-13 LAB — AMMONIA: Ammonia: 26 umol/L (ref 11–60)

## 2013-11-13 LAB — BASIC METABOLIC PANEL
Anion gap: 13 (ref 5–15)
BUN: 27 mg/dL — ABNORMAL HIGH (ref 6–23)
CHLORIDE: 96 meq/L (ref 96–112)
CO2: 30 mEq/L (ref 19–32)
CREATININE: 1.17 mg/dL (ref 0.50–1.35)
Calcium: 8.7 mg/dL (ref 8.4–10.5)
GFR calc Af Amer: 71 mL/min — ABNORMAL LOW (ref >90)
GFR calc non Af Amer: 61 mL/min — ABNORMAL LOW (ref >90)
GLUCOSE: 138 mg/dL — AB (ref 70–99)
Potassium: 3.1 mEq/L — ABNORMAL LOW (ref 3.7–5.3)
Sodium: 139 mEq/L (ref 137–147)

## 2013-11-13 LAB — CBC
HCT: 25.2 % — ABNORMAL LOW (ref 39.0–52.0)
Hemoglobin: 6.7 g/dL — CL (ref 13.0–17.0)
MCH: 17.6 pg — ABNORMAL LOW (ref 26.0–34.0)
MCHC: 26.6 g/dL — ABNORMAL LOW (ref 30.0–36.0)
MCV: 66.1 fL — ABNORMAL LOW (ref 78.0–100.0)
Platelets: 350 10*3/uL (ref 150–400)
RBC: 3.81 MIL/uL — AB (ref 4.22–5.81)
RDW: 21.8 % — ABNORMAL HIGH (ref 11.5–15.5)
WBC: 10.2 10*3/uL (ref 4.0–10.5)

## 2013-11-13 LAB — HEMOGLOBIN AND HEMATOCRIT, BLOOD
HEMATOCRIT: 28.3 % — AB (ref 39.0–52.0)
Hemoglobin: 7.8 g/dL — ABNORMAL LOW (ref 13.0–17.0)

## 2013-11-13 LAB — HEPARIN LEVEL (UNFRACTIONATED): Heparin Unfractionated: <0.1 IU/mL — ABNORMAL LOW (ref 0.30–0.70)

## 2013-11-13 MED ORDER — LORAZEPAM 1 MG PO TABS: 0.0000 mg | ORAL_TABLET | Freq: Two times a day (BID) | ORAL | Status: AC

## 2013-11-13 MED ORDER — PERFLUTREN LIPID MICROSPHERE
1.0000 mL | INTRAVENOUS | Status: AC | PRN
  Filled 2013-11-13: qty 10

## 2013-11-13 MED ORDER — LORAZEPAM 1 MG PO TABS: 1.0000 mg | ORAL_TABLET | Freq: Four times a day (QID) | ORAL | Status: AC | PRN

## 2013-11-13 MED ORDER — THIAMINE HCL 100 MG/ML IJ SOLN
100.0000 mg | Freq: Every day | INTRAMUSCULAR | Status: DC
  Administered 2013-11-19: 100 mg via INTRAVENOUS
  Filled 2013-11-13 (×9): qty 1

## 2013-11-13 MED ORDER — POTASSIUM CHLORIDE CRYS ER 20 MEQ PO TBCR
40.0000 meq | EXTENDED_RELEASE_TABLET | Freq: Once | ORAL | Status: AC
  Administered 2013-11-13: 40 meq via ORAL
  Filled 2013-11-13: qty 2

## 2013-11-13 MED ORDER — ADULT MULTIVITAMIN W/MINERALS CH
1.0000 | ORAL_TABLET | Freq: Every day | ORAL | Status: DC
  Administered 2013-11-13 – 2013-11-24 (×12): 1 via ORAL
  Filled 2013-11-13 (×12): qty 1

## 2013-11-13 MED ORDER — PERFLUTREN LIPID MICROSPHERE
INTRAVENOUS | Status: AC
  Filled 2013-11-13: qty 10

## 2013-11-13 MED ORDER — VITAMIN B-1 100 MG PO TABS
100.0000 mg | ORAL_TABLET | Freq: Every day | ORAL | Status: DC
  Administered 2013-11-13 – 2013-11-24 (×11): 100 mg via ORAL
  Filled 2013-11-13 (×12): qty 1

## 2013-11-13 MED ORDER — POTASSIUM CHLORIDE CRYS ER 20 MEQ PO TBCR
40.0000 meq | EXTENDED_RELEASE_TABLET | Freq: Every day | ORAL | Status: DC
  Administered 2013-11-13 – 2013-11-18 (×6): 40 meq via ORAL
  Filled 2013-11-13 (×6): qty 2

## 2013-11-13 MED ORDER — LORAZEPAM 1 MG PO TABS: 0.0000 mg | ORAL_TABLET | Freq: Four times a day (QID) | ORAL | Status: AC

## 2013-11-13 MED ORDER — LORAZEPAM 2 MG/ML IJ SOLN: 1.0000 mg | Freq: Four times a day (QID) | INTRAMUSCULAR | Status: AC | PRN

## 2013-11-13 NOTE — Progress Notes (Signed)
Echo Lab  2D Echocardiogram completed.  [name], RDCS 11/13/2013 11:06 AM

## 2013-11-13 NOTE — Progress Notes (Signed)
Nutrition Brief Note  Patient identified on the Malnutrition Screening Tool (MST) Report  Wt Readings from Last 15 Encounters:  11/13/13 249 lb 5.4 oz (113.1 kg)  11/12/13 248 lb 3.2 oz (112.583 kg)  08/31/13 249 lb 14.4 oz (113.354 kg)  11/13/12 249 lb 6.4 oz (113.127 kg)  11/03/12 249 lb 3.2 oz (113.036 kg)  09/18/12 242 lb (109.77 kg)  09/18/12 242 lb (109.77 kg)  09/12/12 242 lb 9.6 oz (110.043 kg)  09/08/12 247 lb (112.038 kg)  12/08/11 245 lb (111.131 kg)  09/16/11 242 lb 12.8 oz (110.133 kg)  08/16/11 241 lb 2.9 oz (109.4 kg)  08/16/11 241 lb 2.9 oz (109.4 kg)  07/28/11 241 lb 12.8 oz (109.68 kg)  04/27/11 231 lb (104.781 kg)    Body mass index is 32.9 kg/(m^2). Patient meets criteria for obesity based on current BMI.   Current diet order is Heart Healthy, patient reports that he is consuming approximately 100% of meals at this time. Labs and medications reviewed.   -Pt reports no recent weight changes. He says that his appetite was good prior to admission.  No nutrition interventions warranted at this time. If nutrition issues arise, please consult RD.   [name] RD, LDN

## 2013-11-13 NOTE — Progress Notes (Signed)
UR Completed.  [name] Jane [phone] 11/13/2013  

## 2013-11-13 NOTE — Progress Notes (Addendum)
Subjective:  Pt denies CP/SOB   Objective:  Temp:  [97.4 F (36.3 C)-98.6 F (37 C)] 98.3 F (36.8 C) (08/04 1130) Pulse Rate:  [36-77] 72 (08/04 1130) Resp:  [16-31] 19 (08/04 1130) BP: (90-129)/(45-79) 101/69 mmHg (08/04 1130) SpO2:  [72 %-100 %] 100 % (08/04 1130) Weight:  [246 lb 7.6 oz (111.8 kg)-249 lb 5.4 oz (113.1 kg)] 249 lb 5.4 oz (113.1 kg) (08/04 0412) Weight change:   Intake/Output from previous day: 08/03 0701 - 08/04 0700 In: 519.4 [I.V.:24; Blood:495.4] Out: 975 [Urine:975]  Intake/Output from this shift:    Physical Exam: General appearance: alert, icteric and no distress Neck: no adenopathy, no carotid bruit, no JVD, supple, symmetrical, trachea midline and thyroid not enlarged, symmetric, no tenderness/mass/nodules Lungs: decreased BS at bases Heart: regular rate and rhythm, S1, S2 normal, no murmur, click, rub or gallop Abdomen: soft, non-tender; bowel sounds normal; no masses,  no organomegaly Extremities: 2-3+ pitting edema  Lab Results: Results for orders placed during the hospital encounter of 11/12/13 (from the past 48 hour(s))  MRSA PCR SCREENING     Status: None   Collection Time    11/12/13  6:19 PM      Result Value Ref Range   MRSA by PCR NEGATIVE  NEGATIVE   Comment:            The GeneXpert MRSA Assay (FDA     approved for NASAL specimens     only), is one component of a     comprehensive MRSA colonization     surveillance program. It is not     intended to diagnose MRSA     infection nor to guide or     monitor treatment for     MRSA infections.  CBC WITH DIFFERENTIAL     Status: Abnormal   Collection Time    11/12/13  6:20 PM      Result Value Ref Range   WBC 10.3  4.0 - 10.5 K/uL   RBC 3.77 (*) 4.22 - 5.81 MIL/uL   Hemoglobin 6.3 (*) 13.0 - 17.0 g/dL   Comment: REPEATED TO VERIFY     CRITICAL RESULT CALLED TO, READ BACK BY AND VERIFIED WITH:     Oneita Kras RN [phone] 1906 GREEN R   HCT 24.4 (*) 39.0 - 52.0 %   MCV  64.7 (*) 78.0 - 100.0 fL   MCH 16.7 (*) 26.0 - 34.0 pg   MCHC 25.8 (*) 30.0 - 36.0 g/dL   RDW 20.5 (*) 11.5 - 15.5 %   Platelets 375  150 - 400 K/uL   Neutrophils Relative % 86 (*) 43 - 77 %   Lymphocytes Relative 8 (*) 12 - 46 %   Monocytes Relative 6  3 - 12 %   Eosinophils Relative 0  0 - 5 %   Basophils Relative 0  0 - 1 %   Neutro Abs 8.9 (*) 1.7 - 7.7 K/uL   Lymphs Abs 0.8  0.7 - 4.0 K/uL   Monocytes Absolute 0.6  0.1 - 1.0 K/uL   Eosinophils Absolute 0.0  0.0 - 0.7 K/uL   Basophils Absolute 0.0  0.0 - 0.1 K/uL   RBC Morphology POLYCHROMASIA PRESENT     Comment: SPHEROCYTES  COMPREHENSIVE METABOLIC PANEL     Status: Abnormal   Collection Time    11/12/13  6:20 PM      Result Value Ref Range   Sodium 136 (*) 137 - 147 mEq/L  Potassium 3.5 (*) 3.7 - 5.3 mEq/L   Chloride 94 (*) 96 - 112 mEq/L   CO2 29  19 - 32 mEq/L   Glucose, Bld 131 (*) 70 - 99 mg/dL   BUN 28 (*) 6 - 23 mg/dL   Creatinine, Ser 1.19  0.50 - 1.35 mg/dL   Calcium 8.8  8.4 - 10.5 mg/dL   Total Protein 6.5  6.0 - 8.3 g/dL   Albumin 3.0 (*) 3.5 - 5.2 g/dL   AST 57 (*) 0 - 37 U/L   ALT 87 (*) 0 - 53 U/L   Alkaline Phosphatase 217 (*) 39 - 117 U/L   Total Bilirubin 2.8 (*) 0.3 - 1.2 mg/dL   GFR calc non Af Amer 60 (*) >90 mL/min   GFR calc Af Amer 69 (*) >90 mL/min   Comment: (NOTE)     The eGFR has been calculated using the CKD EPI equation.     This calculation has not been validated in all clinical situations.     eGFR's persistently <90 mL/min signify possible Chronic Kidney     Disease.   Anion gap 13  5 - 15  MAGNESIUM     Status: None   Collection Time    11/12/13  6:20 PM      Result Value Ref Range   Magnesium 2.3  1.5 - 2.5 mg/dL  PRO B NATRIURETIC PEPTIDE     Status: Abnormal   Collection Time    11/12/13  6:20 PM      Result Value Ref Range   Pro B Natriuretic peptide (BNP) 3747.0 (*) 0 - 125 pg/mL  PROTIME-INR     Status: Abnormal   Collection Time    11/12/13  6:20 PM      Result  Value Ref Range   Prothrombin Time 16.5 (*) 11.6 - 15.2 seconds   INR 1.33  0.00 - 1.49  TROPONIN I     Status: None   Collection Time    11/12/13  6:20 PM      Result Value Ref Range   Troponin I <0.30  <0.30 ng/mL   Comment:            Due to the release kinetics of cTnI,     a negative result within the first hours     of the onset of symptoms does not rule out     myocardial infarction with certainty.     If myocardial infarction is still suspected,     repeat the test at appropriate intervals.  TSH     Status: Abnormal   Collection Time    11/12/13  6:20 PM      Result Value Ref Range   TSH 7.070 (*) 0.350 - 4.500 uIU/mL  PREPARE RBC (CROSSMATCH)     Status: None   Collection Time    11/12/13  7:30 PM      Result Value Ref Range   Order Confirmation ORDER PROCESSED BY BLOOD BANK    TYPE AND SCREEN     Status: None   Collection Time    11/12/13  8:20 PM      Result Value Ref Range   ABO/RH(D) A POS     Antibody Screen NEG     Sample Expiration 11/15/2013     Unit Number [id number]     Blood Component Type RED CELLS,LR     Unit division 00     Status of Unit ISSUED,FINAL     Transfusion  Status OK TO TRANSFUSE     Crossmatch Result Compatible     Unit Number [id number]     Blood Component Type RED CELLS,LR     Unit division 00     Status of Unit ISSUED     Transfusion Status OK TO TRANSFUSE     Crossmatch Result Compatible    URINALYSIS, ROUTINE W REFLEX MICROSCOPIC     Status: None   Collection Time    11/12/13  8:30 PM      Result Value Ref Range   Color, Urine YELLOW  YELLOW   APPearance CLEAR  CLEAR   Specific Gravity, Urine 1.012  1.005 - 1.030   pH 5.5  5.0 - 8.0   Glucose, UA NEGATIVE  NEGATIVE mg/dL   Hgb urine dipstick NEGATIVE  NEGATIVE   Bilirubin Urine NEGATIVE  NEGATIVE   Ketones, ur NEGATIVE  NEGATIVE mg/dL   Protein, ur NEGATIVE  NEGATIVE mg/dL   Urobilinogen, UA 1.0  0.0 - 1.0 mg/dL   Nitrite NEGATIVE  NEGATIVE   Leukocytes, UA  NEGATIVE  NEGATIVE   Comment: MICROSCOPIC NOT DONE ON URINES WITH NEGATIVE PROTEIN, BLOOD, LEUKOCYTES, NITRITE, OR GLUCOSE <1000 mg/dL.  BASIC METABOLIC PANEL     Status: Abnormal   Collection Time    11/13/13  4:40 AM      Result Value Ref Range   Sodium 139  137 - 147 mEq/L   Potassium 3.1 (*) 3.7 - 5.3 mEq/L   Chloride 96  96 - 112 mEq/L   CO2 30  19 - 32 mEq/L   Glucose, Bld 138 (*) 70 - 99 mg/dL   BUN 27 (*) 6 - 23 mg/dL   Creatinine, Ser 1.17  0.50 - 1.35 mg/dL   Calcium 8.7  8.4 - 10.5 mg/dL   GFR calc non Af Amer 61 (*) >90 mL/min   GFR calc Af Amer 71 (*) >90 mL/min   Comment: (NOTE)     The eGFR has been calculated using the CKD EPI equation.     This calculation has not been validated in all clinical situations.     eGFR's persistently <90 mL/min signify possible Chronic Kidney     Disease.   Anion gap 13  5 - 15  TROPONIN I     Status: None   Collection Time    11/13/13  4:40 AM      Result Value Ref Range   Troponin I <0.30  <0.30 ng/mL   Comment:            Due to the release kinetics of cTnI,     a negative result within the first hours     of the onset of symptoms does not rule out     myocardial infarction with certainty.     If myocardial infarction is still suspected,     repeat the test at appropriate intervals.  HEPARIN LEVEL (UNFRACTIONATED)     Status: Abnormal   Collection Time    11/13/13  4:40 AM      Result Value Ref Range   Heparin Unfractionated <0.10 (*) 0.30 - 0.70 IU/mL   Comment:            IF HEPARIN RESULTS ARE BELOW     EXPECTED VALUES, AND PATIENT     DOSAGE HAS BEEN CONFIRMED,     SUGGEST FOLLOW UP TESTING     OF ANTITHROMBIN III LEVELS.     RESULT REPEATED AND VERIFIED  CBC  Status: Abnormal   Collection Time    11/13/13  4:40 AM      Result Value Ref Range   WBC 10.2  4.0 - 10.5 K/uL   RBC 3.81 (*) 4.22 - 5.81 MIL/uL   Hemoglobin 6.7 (*) 13.0 - 17.0 g/dL   Comment: REPEATED TO VERIFY     CRITICAL VALUE NOTED.  VALUE IS  CONSISTENT WITH PREVIOUSLY REPORTED AND CALLED VALUE.   HCT 25.2 (*) 39.0 - 52.0 %   MCV 66.1 (*) 78.0 - 100.0 fL   MCH 17.6 (*) 26.0 - 34.0 pg   MCHC 26.6 (*) 30.0 - 36.0 g/dL   RDW 21.8 (*) 11.5 - 15.5 %   Platelets 350  150 - 400 K/uL    Imaging: Imaging results have been reviewed  Assessment/Plan:   1. Active Problems: 2.   Acute on chronic systolic and diastolic heart failure, NYHA class 4 3. Afib 4. Elevated LFTs 5. AoV replacement (bioprosthetic) 6. GIB/Anemia  Time Spent Directly with Patient:  30 minutes  Length of Stay:  LOS: 1 day   Pt of Dr. Lurline Del admitted from the office for CHF, AFIB and jaundice. He is clearly volume overloaded and getting approp diuresed (lasix 80 mg IV BID. His rhythm is paced but apparently underlying rhythm AFIB (new since 7/4). He has biventricular failure with SOB, bilateral pleural effusions and edema. He drinks 2-3 drinks/d. His HGB was 6.7 now s/p Tx 2 u PRBCs (repeat CBC just ordered). IV hep approp stopped. 2D pending.   Will get GI eval, EP consult and CHF service. May benefit from addition of inotropes but will wait to see response to IV diuresis. Will also need TEE DCCV prior to D/C. Not an oral AC candidate given GIB. Will check serum NH4. Suspect elevated LFTs multifactorial from ETOH on top of congestive hepatopathy.   Lorretta Harp 11/13/2013, 12:59 PM

## 2013-11-13 NOTE — Consult Note (Signed)
ELECTROPHYSIOLOGY CONSULT NOTE    Patient ID: Juan FavorKenneth R Dumond MRN: [id number], DOB/AGE: 11-03-1940 73 y.o.  Admit date: 11/12/2013 Date of Consult: 11-13-13  Primary Physician: Raliegh IpPHILLIPS,CHARLES W, MD Primary Cardiologist: Croitoru Electrophysiologist: Allred  Reason for Consultation: atrial fibrillation  HPI:  [name] is a 73 y.o. male with a past medical history significant for non ischemic cardiomyopathy (s/p STJ CRTD 2013), hypertension, CAD (s/p CABG), chronic systolic heart failure, s/p AVR.  He presented to the office yesterday after calling in complaining of a 2 week history of significant fatigue and shortness of breath.  He was found to be in atrial fibrillation and class IV heart failure.  He was admitted for further evaluation.    Lab work on admission is notable for anemia (Hgb 6.4), elevated LFT's, BNP 3747.  Echo pending this admission.  Last echo 07-2012 demonstrated EF 30-35%, LA 43.  He currently endorses shortness of breath, lower extremity edema, chest pressure, orthopnea, PND, and dizziness.  He denies frank syncope or palpitations.  ROS is otherwise negative.    He reports drinking at least 2 drinks of 90 proof liquor per night.   EP has been asked to evaluate for treatment options.  Past Medical History  Diagnosis Date  . Nonischemic cardiomyopathy     EF 25-30%  . Valvular cardiomyopathy   . Aortic valve replaced     x2 2002 & 2009 Medtronic free style aortic root  . CAD (coronary artery disease)     s/p CABG  . LBBB (left bundle branch block)     QRS 180msec  . CHF (congestive heart failure)   . Hypertension      Surgical History:  Past Surgical History  Procedure Laterality Date  . Aortic valve replacement      s/p 2 separate AVR procedures, an initial mechanical AVR was replaced with a Medtronic Freestyle root in 2009 with reimplantation of the cors as well as redo SVG to RCA and preservation of LIMA to LAD  . Coronary artery bypass graft       2002  . Biv icd implant  08/16/11    SJM BiV ICD implanted by Dr Johney FrameAllred  . Cardiac valve replacement       Prescriptions prior to admission  Medication Sig Dispense Refill  . albuterol (PROVENTIL HFA;VENTOLIN HFA) 108 (90 BASE) MCG/ACT inhaler Inhale 2 puffs into the lungs every 4 (four) hours as needed for wheezing or shortness of breath.  1 Inhaler  1  . allopurinol (ZYLOPRIM) 300 MG tablet Take 300 mg by mouth daily.        Marland Kitchen. amitriptyline (ELAVIL) 25 MG tablet Take 25 mg by mouth at bedtime.      Marland Kitchen. atorvastatin (LIPITOR) 20 MG tablet Take 20 mg by mouth daily.        . carvedilol (COREG) 6.25 MG tablet Take 1 tablet (6.25 mg total) by mouth 2 (two) times daily with a meal.  60 tablet  0  . cyanocobalamin 100 MCG tablet Take 100 mcg by mouth daily.      . famotidine (PEPCID) 20 MG tablet Take 20 mg by mouth at bedtime.      . folic acid (FOLVITE) 1 MG tablet Take 1 mg by mouth daily.      . furosemide (LASIX) 20 MG tablet Take 20 mg by mouth daily.      . pantoprazole (PROTONIX) 40 MG tablet Take 1 tablet (40 mg total) by mouth daily. Take 30-60 min before  first meal of the day  30 tablet  2  . Umeclidinium-Vilanterol 62.5-25 MCG/INH AEPB Inhale 1 puff into the lungs daily.        Inpatient Medications:  . antiseptic oral rinse  7 mL Mouth Rinse BID  . aspirin EC  81 mg Oral Daily  . carvedilol  6.25 mg Oral BID WC  . clopidogrel  75 mg Oral Daily  . ferrous sulfate  325 mg Oral BID WC  . folic acid  1 mg Oral Daily  . furosemide  80 mg Intravenous Q12H  . pantoprazole  40 mg Oral QAC breakfast  . perflutren lipid microspheres (DEFINITY) IV suspension      . potassium chloride  40 mEq Oral Daily    Allergies:  Allergies  Allergen Reactions  . Ace Inhibitors Cough    History   Social History  . Marital Status: Married    Spouse Name: N/A    Number of Children: N/A  . Years of Education: N/A   Occupational History  . retired    Social History Main Topics  .  Smoking status: Former Smoker -- 1.50 packs/day for 36 years    Types: Cigarettes    Quit date: 04/12/1992  . Smokeless tobacco: Never Used  . Alcohol Use: Yes     Comment: moderate  . Drug Use: No  . Sexual Activity: Not on file   Other Topics Concern  . Not on file   Social History Narrative  . No narrative on file     Family History  Problem Relation Age of Onset  . Diabetes Mother   . Hypertension Mother   . Heart attack Father   . Heart attack Brother   . Diabetes Brother   . Cancer Sister   . Diabetes Sister   . Hypertension Sister     BP 101/69  Pulse 72  Temp(Src) 98.3 F (36.8 C) (Oral)  Resp 19  Ht 6\' 1"  (1.854 m)  Wt 249 lb 5.4 oz (113.1 kg)  BMI 32.90 kg/m2  SpO2 100%  Physical Exam: Chronically ill appearing 73 yo man, NAD HEENT: Unremarkable,[place], AT Neck:  8 cm JVD, no thyromegally Back:  No CVA tenderness Lungs:  Scattered rales with no wheezes, or rhonchi HEART:  Regular rate rhythm, no murmurs, no rubs, no clicks Abd:  soft, positive bowel sounds, no organomegally, no rebound, no guarding Ext:  2 plus pulses, 2+ peripheral edema, no cyanosis, no clubbing Skin:  No rashes no nodules Neuro:  CN II through XII intact, motor grossly intact    Labs:   Lab Results  Component Value Date   WBC 10.2 11/13/2013   HGB 6.7* 11/13/2013   HCT 25.2* 11/13/2013   MCV 66.1* 11/13/2013   PLT 350 11/13/2013     Recent Labs Lab 11/12/13 1820 11/13/13 0440  NA 136* 139  K 3.5* 3.1*  CL 94* 96  CO2 29 30  BUN 28* 27*  CREATININE 1.19 1.17  CALCIUM 8.8 8.7  PROT 6.5  --   BILITOT 2.8*  --   ALKPHOS 217*  --   ALT 87*  --   AST 57*  --   GLUCOSE 131* 138*   Lab Results  Component Value Date   TROPONINI <0.30 11/13/2013     Radiology/Studies: Dg Chest 2 View 11/13/2013   CLINICAL DATA:  COPD exacerbation.  Volume overload.  EXAM: CHEST  2 VIEW  COMPARISON:  09/12/2012  FINDINGS: Since the prior study, opacity has developed  in the lower lobe head right  lung base. This is consistent with a combination of a small a moderate effusion with atelectasis or infiltrate. There is mild atelectasis at the left lateral lung base. No left pleural effusion.  Cardiac silhouette is borderline enlarged. Changes from cardiac surgery are stable. No mediastinal or hilar masses.  Stable well-positioned left anterior chest wall sequential pacemaker.  IMPRESSION: 1. New, small to moderate, right pleural effusion with associated atelectasis or infiltrate in the right lower lobe. No convincing pulmonary edema.   Electronically Signed   By: Amie Portland M.D.   On: 11/13/2013 09:44   EKG: atrial fibrillation with ventricular pacing, rate 84  TELEMETRY: atrial fibrillation with variable ventricular response, intermittent ventricular pacing  DEVICE HISTORY:  STJ 3265-40Q CRTD implanted by Dr Johney Frame 08-16-11  A/P  1. Acute on chronic systolic CHF 2. Atrial fib with a controlled VR 3. Anemia, likely GI bleeding, s/p transfusion 4. S/p BiV ICD 5. H/o aortic valve disease, s/p replacement 6. CAD, s/p CABG Rec: a very difficult combination of problems. He is not a candidate for DCCV, whether he has a TEE or not if he is not a candidate for long term coumadin or a NOAC. He will need repletion of blood and IV lasix. Long term a strategy of rate control for atrial fib seem most appropriate. His ICD appears to be working appropriately. Not much we can offer at this point. If he becomes elligible for anti-coagulation, then TEE guided DCCV and anti-arrhythmic drug therapy would be a strong consideration.  Leonia Reeves.D.

## 2013-11-14 ENCOUNTER — Encounter (HOSPITAL_COMMUNITY): Payer: Self-pay | Admitting: *Deleted

## 2013-11-14 DIAGNOSIS — G9349 Other encephalopathy: Secondary | ICD-10-CM

## 2013-11-14 DIAGNOSIS — I447 Left bundle-branch block, unspecified: Secondary | ICD-10-CM

## 2013-11-14 DIAGNOSIS — D62 Acute posthemorrhagic anemia: Secondary | ICD-10-CM

## 2013-11-14 DIAGNOSIS — D649 Anemia, unspecified: Secondary | ICD-10-CM | POA: Diagnosis present

## 2013-11-14 HISTORY — DX: Acute posthemorrhagic anemia: D62

## 2013-11-14 LAB — CBC
HEMATOCRIT: 27 % — AB (ref 39.0–52.0)
Hemoglobin: 7.3 g/dL — ABNORMAL LOW (ref 13.0–17.0)
MCH: 18.4 pg — ABNORMAL LOW (ref 26.0–34.0)
MCHC: 27 g/dL — ABNORMAL LOW (ref 30.0–36.0)
MCV: 68.2 fL — AB (ref 78.0–100.0)
PLATELETS: 310 10*3/uL (ref 150–400)
RBC: 3.96 MIL/uL — AB (ref 4.22–5.81)
RDW: 22.6 % — ABNORMAL HIGH (ref 11.5–15.5)
WBC: 12.1 10*3/uL — ABNORMAL HIGH (ref 4.0–10.5)

## 2013-11-14 LAB — BASIC METABOLIC PANEL
Anion gap: 13 (ref 5–15)
BUN: 28 mg/dL — AB (ref 6–23)
CO2: 31 mEq/L (ref 19–32)
Calcium: 8.7 mg/dL (ref 8.4–10.5)
Chloride: 97 mEq/L (ref 96–112)
Creatinine, Ser: 1.24 mg/dL (ref 0.50–1.35)
GFR calc Af Amer: 66 mL/min — ABNORMAL LOW (ref >90)
GFR, EST NON AFRICAN AMERICAN: 57 mL/min — AB (ref >90)
Glucose, Bld: 133 mg/dL — ABNORMAL HIGH (ref 70–99)
Potassium: 3.6 mEq/L — ABNORMAL LOW (ref 3.7–5.3)
Sodium: 141 mEq/L (ref 137–147)

## 2013-11-14 MED ORDER — PANTOPRAZOLE SODIUM 40 MG PO TBEC
40.0000 mg | DELAYED_RELEASE_TABLET | Freq: Two times a day (BID) | ORAL | Status: DC
  Administered 2013-11-14 – 2013-11-20 (×13): 40 mg via ORAL
  Filled 2013-11-14 (×13): qty 1

## 2013-11-14 NOTE — Progress Notes (Addendum)
DAILY PROGRESS NOTE  Subjective:  Minimal improvement in H/H overnight up to 7.8/28.3 after 2 units PRBC's, but has drifted down to 7.3/27 this am.  Echo shows worsening LV dysfunction, with a dilated ventricle and EF of 25-30% (down from 30-35% in 07/2012). There is reported decrease in Bi-V pacing efficiency, probably related to a-fib. BNP is elevated at 3747. Troponin is negative x 2.  Objective:  Temp:  [97.5 F (36.4 C)-99.5 F (37.5 C)] 98.6 F (37 C) (08/05 1151) Pulse Rate:  [70-72] 72 (08/05 1151) Resp:  [17-25] 23 (08/05 1151) BP: (93-110)/(52-76) 104/52 mmHg (08/05 1151) SpO2:  [91 %-100 %] 96 % (08/05 1151) Weight:  [248 lb 7.3 oz (112.7 kg)] 248 lb 7.3 oz (112.7 kg) (08/05 0500) Weight change: 1 lb 15.7 oz (0.9 kg)  Intake/Output from previous day: 08/04 0701 - 08/05 0700 In: 680 [P.O.:680] Out: 650 [Urine:650]  Intake/Output from this shift: Total I/O In: -  Out: 600 [Urine:600]  Medications: Current Facility-Administered Medications  Medication Dose Route Frequency Provider Last Rate Last Dose  . albuterol (PROVENTIL) (2.5 MG/3ML) 0.083% nebulizer solution 3 mL  3 mL Inhalation Q4H PRN Mihai Croitoru, MD      . antiseptic oral rinse (CPC / CETYLPYRIDINIUM CHLORIDE 0.05%) solution 7 mL  7 mL Mouth Rinse BID Darlin Coco, MD   7 mL at 11/13/13 2200  . aspirin EC tablet 81 mg  81 mg Oral Daily Mihai Croitoru, MD   81 mg at 11/14/13 0929  . carvedilol (COREG) tablet 6.25 mg  6.25 mg Oral BID WC Mihai Croitoru, MD   6.25 mg at 11/13/13 1836  . clopidogrel (PLAVIX) tablet 75 mg  75 mg Oral Daily Mihai Croitoru, MD   75 mg at 11/14/13 0933  . ferrous sulfate tablet 325 mg  325 mg Oral BID WC Mihai Croitoru, MD   325 mg at 93/26/71 2458  . folic acid (FOLVITE) tablet 1 mg  1 mg Oral Daily Mihai Croitoru, MD   1 mg at 11/14/13 0929  . furosemide (LASIX) injection 80 mg  80 mg Intravenous Q12H Mihai Croitoru, MD   80 mg at 11/14/13 0657  . LORazepam (ATIVAN)  tablet 1 mg  1 mg Oral Q6H PRN Lorretta Harp, MD       Or  . LORazepam (ATIVAN) injection 1 mg  1 mg Intravenous Q6H PRN Lorretta Harp, MD      . LORazepam (ATIVAN) tablet 0-4 mg  0-4 mg Oral Q6H Lorretta Harp, MD       Followed by  . [START ON 11/15/2013] LORazepam (ATIVAN) tablet 0-4 mg  0-4 mg Oral Q12H Lorretta Harp, MD      . multivitamin with minerals tablet 1 tablet  1 tablet Oral Daily Lorretta Harp, MD   1 tablet at 11/14/13 [phone]  . pantoprazole (PROTONIX) EC tablet 40 mg  40 mg Oral QAC breakfast Sanda Klein, MD   40 mg at 11/14/13 0929  . potassium chloride SA (K-DUR,KLOR-CON) CR tablet 40 mEq  40 mEq Oral Daily Lorretta Harp, MD   40 mEq at 11/14/13 0929  . thiamine (VITAMIN B-1) tablet 100 mg  100 mg Oral Daily Lorretta Harp, MD   100 mg at 11/14/13 3382   Or  . thiamine (B-1) injection 100 mg  100 mg Intravenous Daily Lorretta Harp, MD        Physical Exam: General appearance: slowed mentation and jaundiced Lungs: diminished breath sounds  bilaterally Heart: regular rate and rhythm Extremities: edema 1+ Skin: jaundiced Neurologic: Mental status: Awake, somewhat somnolent, oriented to place  Lab Results: Results for orders placed during the hospital encounter of 11/12/13 (from the past 48 hour(s))  MRSA PCR SCREENING     Status: None   Collection Time    11/12/13  6:19 PM      Result Value Ref Range   MRSA by PCR NEGATIVE  NEGATIVE   Comment:            The GeneXpert MRSA Assay (FDA     approved for NASAL specimens     only), is one component of a     comprehensive MRSA colonization     surveillance program. It is not     intended to diagnose MRSA     infection nor to guide or     monitor treatment for     MRSA infections.  CBC WITH DIFFERENTIAL     Status: Abnormal   Collection Time    11/12/13  6:20 PM      Result Value Ref Range   WBC 10.3  4.0 - 10.5 K/uL   RBC 3.77 (*) 4.22 - 5.81 MIL/uL   Hemoglobin 6.3 (*) 13.0 - 17.0 g/dL    Comment: REPEATED TO VERIFY     CRITICAL RESULT CALLED TO, READ BACK BY AND VERIFIED WITH:     Oneita Kras RN [phone] 1906 GREEN R   HCT 24.4 (*) 39.0 - 52.0 %   MCV 64.7 (*) 78.0 - 100.0 fL   MCH 16.7 (*) 26.0 - 34.0 pg   MCHC 25.8 (*) 30.0 - 36.0 g/dL   RDW 20.5 (*) 11.5 - 15.5 %   Platelets 375  150 - 400 K/uL   Neutrophils Relative % 86 (*) 43 - 77 %   Lymphocytes Relative 8 (*) 12 - 46 %   Monocytes Relative 6  3 - 12 %   Eosinophils Relative 0  0 - 5 %   Basophils Relative 0  0 - 1 %   Neutro Abs 8.9 (*) 1.7 - 7.7 K/uL   Lymphs Abs 0.8  0.7 - 4.0 K/uL   Monocytes Absolute 0.6  0.1 - 1.0 K/uL   Eosinophils Absolute 0.0  0.0 - 0.7 K/uL   Basophils Absolute 0.0  0.0 - 0.1 K/uL   RBC Morphology POLYCHROMASIA PRESENT     Comment: SPHEROCYTES  COMPREHENSIVE METABOLIC PANEL     Status: Abnormal   Collection Time    11/12/13  6:20 PM      Result Value Ref Range   Sodium 136 (*) 137 - 147 mEq/L   Potassium 3.5 (*) 3.7 - 5.3 mEq/L   Chloride 94 (*) 96 - 112 mEq/L   CO2 29  19 - 32 mEq/L   Glucose, Bld 131 (*) 70 - 99 mg/dL   BUN 28 (*) 6 - 23 mg/dL   Creatinine, Ser 1.19  0.50 - 1.35 mg/dL   Calcium 8.8  8.4 - 10.5 mg/dL   Total Protein 6.5  6.0 - 8.3 g/dL   Albumin 3.0 (*) 3.5 - 5.2 g/dL   AST 57 (*) 0 - 37 U/L   ALT 87 (*) 0 - 53 U/L   Alkaline Phosphatase 217 (*) 39 - 117 U/L   Total Bilirubin 2.8 (*) 0.3 - 1.2 mg/dL   GFR calc non Af Amer 60 (*) >90 mL/min   GFR calc Af Amer 69 (*) >90 mL/min   Comment: (  NOTE)     The eGFR has been calculated using the CKD EPI equation.     This calculation has not been validated in all clinical situations.     eGFR's persistently <90 mL/min signify possible Chronic Kidney     Disease.   Anion gap 13  5 - 15  MAGNESIUM     Status: None   Collection Time    11/12/13  6:20 PM      Result Value Ref Range   Magnesium 2.3  1.5 - 2.5 mg/dL  PRO B NATRIURETIC PEPTIDE     Status: Abnormal   Collection Time    11/12/13  6:20 PM      Result  Value Ref Range   Pro B Natriuretic peptide (BNP) 3747.0 (*) 0 - 125 pg/mL  PROTIME-INR     Status: Abnormal   Collection Time    11/12/13  6:20 PM      Result Value Ref Range   Prothrombin Time 16.5 (*) 11.6 - 15.2 seconds   INR 1.33  0.00 - 1.49  TROPONIN I     Status: None   Collection Time    11/12/13  6:20 PM      Result Value Ref Range   Troponin I <0.30  <0.30 ng/mL   Comment:            Due to the release kinetics of cTnI,     a negative result within the first hours     of the onset of symptoms does not rule out     myocardial infarction with certainty.     If myocardial infarction is still suspected,     repeat the test at appropriate intervals.  TSH     Status: Abnormal   Collection Time    11/12/13  6:20 PM      Result Value Ref Range   TSH 7.070 (*) 0.350 - 4.500 uIU/mL  PREPARE RBC (CROSSMATCH)     Status: None   Collection Time    11/12/13  7:30 PM      Result Value Ref Range   Order Confirmation ORDER PROCESSED BY BLOOD BANK    TYPE AND SCREEN     Status: None   Collection Time    11/12/13  8:20 PM      Result Value Ref Range   ABO/RH(D) A POS     Antibody Screen NEG     Sample Expiration 11/15/2013     Unit Number [id number]     Blood Component Type RED CELLS,LR     Unit division 00     Status of Unit ISSUED,FINAL     Transfusion Status OK TO TRANSFUSE     Crossmatch Result Compatible     Unit Number [id number]     Blood Component Type RED CELLS,LR     Unit division 00     Status of Unit ISSUED,FINAL     Transfusion Status OK TO TRANSFUSE     Crossmatch Result Compatible    URINALYSIS, ROUTINE W REFLEX MICROSCOPIC     Status: None   Collection Time    11/12/13  8:30 PM      Result Value Ref Range   Color, Urine YELLOW  YELLOW   APPearance CLEAR  CLEAR   Specific Gravity, Urine 1.012  1.005 - 1.030   pH 5.5  5.0 - 8.0   Glucose, UA NEGATIVE  NEGATIVE mg/dL   Hgb urine dipstick NEGATIVE  NEGATIVE   Bilirubin Urine NEGATIVE  NEGATIVE    Ketones, ur NEGATIVE  NEGATIVE mg/dL   Protein, ur NEGATIVE  NEGATIVE mg/dL   Urobilinogen, UA 1.0  0.0 - 1.0 mg/dL   Nitrite NEGATIVE  NEGATIVE   Leukocytes, UA NEGATIVE  NEGATIVE   Comment: MICROSCOPIC NOT DONE ON URINES WITH NEGATIVE PROTEIN, BLOOD, LEUKOCYTES, NITRITE, OR GLUCOSE <1000 mg/dL.  BASIC METABOLIC PANEL     Status: Abnormal   Collection Time    11/13/13  4:40 AM      Result Value Ref Range   Sodium 139  137 - 147 mEq/L   Potassium 3.1 (*) 3.7 - 5.3 mEq/L   Chloride 96  96 - 112 mEq/L   CO2 30  19 - 32 mEq/L   Glucose, Bld 138 (*) 70 - 99 mg/dL   BUN 27 (*) 6 - 23 mg/dL   Creatinine, Ser 1.17  0.50 - 1.35 mg/dL   Calcium 8.7  8.4 - 10.5 mg/dL   GFR calc non Af Amer 61 (*) >90 mL/min   GFR calc Af Amer 71 (*) >90 mL/min   Comment: (NOTE)     The eGFR has been calculated using the CKD EPI equation.     This calculation has not been validated in all clinical situations.     eGFR's persistently <90 mL/min signify possible Chronic Kidney     Disease.   Anion gap 13  5 - 15  TROPONIN I     Status: None   Collection Time    11/13/13  4:40 AM      Result Value Ref Range   Troponin I <0.30  <0.30 ng/mL   Comment:            Due to the release kinetics of cTnI,     a negative result within the first hours     of the onset of symptoms does not rule out     myocardial infarction with certainty.     If myocardial infarction is still suspected,     repeat the test at appropriate intervals.  HEPARIN LEVEL (UNFRACTIONATED)     Status: Abnormal   Collection Time    11/13/13  4:40 AM      Result Value Ref Range   Heparin Unfractionated <0.10 (*) 0.30 - 0.70 IU/mL   Comment:            IF HEPARIN RESULTS ARE BELOW     EXPECTED VALUES, AND PATIENT     DOSAGE HAS BEEN CONFIRMED,     SUGGEST FOLLOW UP TESTING     OF ANTITHROMBIN III LEVELS.     RESULT REPEATED AND VERIFIED  CBC     Status: Abnormal   Collection Time    11/13/13  4:40 AM      Result Value Ref Range   WBC  10.2  4.0 - 10.5 K/uL   RBC 3.81 (*) 4.22 - 5.81 MIL/uL   Hemoglobin 6.7 (*) 13.0 - 17.0 g/dL   Comment: REPEATED TO VERIFY     CRITICAL VALUE NOTED.  VALUE IS CONSISTENT WITH PREVIOUSLY REPORTED AND CALLED VALUE.   HCT 25.2 (*) 39.0 - 52.0 %   MCV 66.1 (*) 78.0 - 100.0 fL   MCH 17.6 (*) 26.0 - 34.0 pg   MCHC 26.6 (*) 30.0 - 36.0 g/dL   RDW 21.8 (*) 11.5 - 15.5 %   Platelets 350  150 - 400 K/uL  AMMONIA     Status: None   Collection Time    11/13/13  1:40 PM      Result Value Ref Range   Ammonia 26  11 - 60 umol/L  HEMOGLOBIN AND HEMATOCRIT, BLOOD     Status: Abnormal   Collection Time    11/13/13  1:45 PM      Result Value Ref Range   Hemoglobin 7.8 (*) 13.0 - 17.0 g/dL   HCT 28.3 (*) 39.0 - 32.6 %  BASIC METABOLIC PANEL     Status: Abnormal   Collection Time    11/14/13  2:59 AM      Result Value Ref Range   Sodium 141  137 - 147 mEq/L   Potassium 3.6 (*) 3.7 - 5.3 mEq/L   Chloride 97  96 - 112 mEq/L   CO2 31  19 - 32 mEq/L   Glucose, Bld 133 (*) 70 - 99 mg/dL   BUN 28 (*) 6 - 23 mg/dL   Creatinine, Ser 1.24  0.50 - 1.35 mg/dL   Calcium 8.7  8.4 - 10.5 mg/dL   GFR calc non Af Amer 57 (*) >90 mL/min   GFR calc Af Amer 66 (*) >90 mL/min   Comment: (NOTE)     The eGFR has been calculated using the CKD EPI equation.     This calculation has not been validated in all clinical situations.     eGFR's persistently <90 mL/min signify possible Chronic Kidney     Disease.   Anion gap 13  5 - 15  CBC     Status: Abnormal   Collection Time    11/14/13  2:59 AM      Result Value Ref Range   WBC 12.1 (*) 4.0 - 10.5 K/uL   RBC 3.96 (*) 4.22 - 5.81 MIL/uL   Hemoglobin 7.3 (*) 13.0 - 17.0 g/dL   HCT 27.0 (*) 39.0 - 52.0 %   MCV 68.2 (*) 78.0 - 100.0 fL   MCH 18.4 (*) 26.0 - 34.0 pg   MCHC 27.0 (*) 30.0 - 36.0 g/dL   RDW 22.6 (*) 11.5 - 15.5 %   Platelets 310  150 - 400 K/uL    Imaging: Dg Chest 2 View  11/13/2013   CLINICAL DATA:  COPD exacerbation.  Volume overload.  EXAM:  CHEST  2 VIEW  COMPARISON:  09/12/2012  FINDINGS: Since the prior study, opacity has developed in the lower lobe head right lung base. This is consistent with a combination of a small a moderate effusion with atelectasis or infiltrate. There is mild atelectasis at the left lateral lung base. No left pleural effusion.  Cardiac silhouette is borderline enlarged. Changes from cardiac surgery are stable. No mediastinal or hilar masses.  Stable well-positioned left anterior chest wall sequential pacemaker.  IMPRESSION: 1. New, small to moderate, right pleural effusion with associated atelectasis or infiltrate in the right lower lobe. No convincing pulmonary edema.   Electronically Signed   By: Lajean Manes M.D.   On: 11/13/2013 09:44    Assessment:  Principal Problem:   Acute on chronic systolic and diastolic heart failure, NYHA class 4 Active Problems:   CAD (coronary artery disease)   LBBB (left bundle branch block)   Biventricular implantable cardioverter-defibrillator in situ   Aortic valve replaced   Other encephalopathy   Persistent atrial fibrillation   Jaundice   Acute blood loss anemia   Plan:  1. Mr. Debroux is somewhat more awake today. Diuresing nicely. H/H improved, but is trending down. Concern for ongoing GI bleeding. ?gastritis in the setting of  etoh use and possible portal hypertension/varices. He is jaundiced. Will check fractionated bilirubin. Increase protonix to 40 mg BID. Will call for GI consult today. Agree with Dr. Lovena Le, difficult to recommend cardioversion if he is not an anticoagulation candidate due to GIB. Will need to manage medically for now. 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 Currently he denies chest pain.   Time Spent Directly with Patient:  30 minutes  Length of Stay:  LOS: 2 days   Pixie Casino, MD, [facility] Attending  Cardiologist [facility]  Lasharn Bufkin,Jahmil C 11/14/2013, 12:39 PM

## 2013-11-15 DIAGNOSIS — D62 Acute posthemorrhagic anemia: Secondary | ICD-10-CM

## 2013-11-15 LAB — CBC
HEMATOCRIT: 26.9 % — AB (ref 39.0–52.0)
Hemoglobin: 7.1 g/dL — ABNORMAL LOW (ref 13.0–17.0)
MCH: 18.3 pg — AB (ref 26.0–34.0)
MCHC: 26.4 g/dL — AB (ref 30.0–36.0)
MCV: 69.3 fL — ABNORMAL LOW (ref 78.0–100.0)
Platelets: 328 10*3/uL (ref 150–400)
RBC: 3.88 MIL/uL — ABNORMAL LOW (ref 4.22–5.81)
RDW: 23.9 % — AB (ref 11.5–15.5)
WBC: 10.2 10*3/uL (ref 4.0–10.5)

## 2013-11-15 LAB — COMPREHENSIVE METABOLIC PANEL
ALT: 90 U/L — AB (ref 0–53)
AST: 83 U/L — ABNORMAL HIGH (ref 0–37)
Albumin: 2.8 g/dL — ABNORMAL LOW (ref 3.5–5.2)
Alkaline Phosphatase: 226 U/L — ABNORMAL HIGH (ref 39–117)
Anion gap: 10 (ref 5–15)
BILIRUBIN TOTAL: 2.2 mg/dL — AB (ref 0.3–1.2)
BUN: 27 mg/dL — AB (ref 6–23)
CO2: 33 mEq/L — ABNORMAL HIGH (ref 19–32)
Calcium: 8.8 mg/dL (ref 8.4–10.5)
Chloride: 98 mEq/L (ref 96–112)
Creatinine, Ser: 1.17 mg/dL (ref 0.50–1.35)
GFR calc Af Amer: 71 mL/min — ABNORMAL LOW (ref >90)
GFR, EST NON AFRICAN AMERICAN: 61 mL/min — AB (ref >90)
Glucose, Bld: 116 mg/dL — ABNORMAL HIGH (ref 70–99)
Potassium: 3.6 mEq/L — ABNORMAL LOW (ref 3.7–5.3)
Sodium: 141 mEq/L (ref 137–147)
Total Protein: 5.9 g/dL — ABNORMAL LOW (ref 6.0–8.3)

## 2013-11-15 LAB — PREPARE RBC (CROSSMATCH)

## 2013-11-15 MED ORDER — SODIUM CHLORIDE 0.9 % IV SOLN
INTRAVENOUS | Status: DC
  Administered 2013-11-15: 20:00:00 via INTRAVENOUS

## 2013-11-15 MED ORDER — SODIUM CHLORIDE 0.9 % IV SOLN: Freq: Once | INTRAVENOUS | Status: DC

## 2013-11-15 NOTE — Consult Note (Signed)
Unassigned Consult  Reason for Consult: Anemia and abnormal liver enzymes Referring Physician: Cardiology  [name] HPI: This is a 73 year old male with a PMH of CHF, CAD, ischemic cardiomyopathy, s/p aortic valve replacement, and HTN who is admitted for CHF.  The patient states that he has been feeling poorly for the past 6 months with fatigue, orthopnea, and PND.  There has been swelling in his legs and abdominal distension and he reports issues with orange urine.  Also, there is a change in his skin color, i.e, it appears jaundiced.  Further evaluation revealed that his HGB has dropped down into the 6 range, but he has a history of anemia.  One year ago he had a normal value, but he has a longer baseline history of HGB in the 8-10 range. His liver panel also reveals a marked elevation in his AP and some mild elevation in his AST/ALT, and TB.  The patient states that he drinks 1-2 drinks per day.  Initially he was evaluated by Pulmonary and then he was sent to Cardiology only to be admitted because of his condition.  Past Medical History  Diagnosis Date  . Nonischemic cardiomyopathy     EF 25-30%  . Valvular cardiomyopathy   . Aortic valve replaced     x2 2002 & 2009 Medtronic free style aortic root  . CAD (coronary artery disease)     s/p CABG  . LBBB (left bundle branch block)     QRS  . CHF (congestive heart failure)   . Hypertension   . Acute blood loss anemia 11/14/2013    Past Surgical History  Procedure Laterality Date  . Aortic valve replacement      s/p 2 separate AVR procedures, an initial mechanical AVR was replaced with a Medtronic Freestyle root in 2009 with reimplantation of the cors as well as redo SVG to RCA and preservation of LIMA to LAD  . Coronary artery bypass graft      2002  . Biv icd implant  08/16/11    SJM BiV ICD implanted by Dr Johney Frame  . Cardiac valve replacement      Family History  Problem Relation Age of Onset  . Diabetes Mother   .  Hypertension Mother   . Heart attack Father   . Heart attack Brother   . Diabetes Brother   . Cancer Sister   . Diabetes Sister   . Hypertension Sister     Social History:  reports that he quit smoking about 21 years ago. His smoking use included Cigarettes. He has a 54 pack-year smoking history. He has never used smokeless tobacco. He reports that he drinks alcohol. He reports that he does not use illicit drugs.  Allergies:  Allergies  Allergen Reactions  . Ace Inhibitors Cough    Medications:  Scheduled: . sodium chloride   Intravenous Once  . antiseptic oral rinse  7 mL Mouth Rinse BID  . carvedilol  6.25 mg Oral BID WC  . clopidogrel  75 mg Oral Daily  . ferrous sulfate  325 mg Oral BID WC  . folic acid  1 mg Oral Daily  . furosemide  80 mg Intravenous Q12H  . LORazepam  0-4 mg Oral Q6H   Followed by  . LORazepam  0-4 mg Oral Q12H  . multivitamin with minerals  1 tablet Oral Daily  . pantoprazole  40 mg Oral BID AC  . potassium chloride  40 mEq Oral Daily  .  thiamine  100 mg Oral Daily   Or  . thiamine  100 mg Intravenous Daily   Continuous:   Results for orders placed during the hospital encounter of 11/12/13 (from the past 24 hour(s))  CBC     Status: Abnormal   Collection Time    11/15/13  3:42 AM      Result Value Ref Range   WBC 10.2  4.0 - 10.5 K/uL   RBC 3.88 (*) 4.22 - 5.81 MIL/uL   Hemoglobin 7.1 (*) 13.0 - 17.0 g/dL   HCT 16.126.9 (*) 09.639.0 - 04.552.0 %   MCV 69.3 (*) 78.0 - 100.0 fL   MCH 18.3 (*) 26.0 - 34.0 pg   MCHC 26.4 (*) 30.0 - 36.0 g/dL   RDW 40.923.9 (*) 81.111.5 - 91.415.5 %   Platelets 328  150 - 400 K/uL  COMPREHENSIVE METABOLIC PANEL     Status: Abnormal   Collection Time    11/15/13  3:42 AM      Result Value Ref Range   Sodium 141  137 - 147 mEq/L   Potassium 3.6 (*) 3.7 - 5.3 mEq/L   Chloride 98  96 - 112 mEq/L   CO2 33 (*) 19 - 32 mEq/L   Glucose, Bld 116 (*) 70 - 99 mg/dL   BUN 27 (*) 6 - 23 mg/dL   Creatinine, Ser 7.821.17  0.50 - 1.35 mg/dL    Calcium 8.8  8.4 - 95.610.5 mg/dL   Total Protein 5.9 (*) 6.0 - 8.3 g/dL   Albumin 2.8 (*) 3.5 - 5.2 g/dL   AST 83 (*) 0 - 37 U/L   ALT 90 (*) 0 - 53 U/L   Alkaline Phosphatase 226 (*) 39 - 117 U/L   Total Bilirubin 2.2 (*) 0.3 - 1.2 mg/dL   GFR calc non Af Amer 61 (*) >90 mL/min   GFR calc Af Amer 71 (*) >90 mL/min   Anion gap 10  5 - 15  PREPARE RBC (CROSSMATCH)     Status: None   Collection Time    11/15/13 12:00 PM      Result Value Ref Range   Order Confirmation ORDER PROCESSED BY BLOOD BANK       No results found.  ROS:  As stated above in the HPI otherwise negative.  Blood pressure 95/77, pulse 72, temperature 97.9 F (36.6 C), temperature source Oral, resp. rate 24, height 6\' 1"  (1.854 m), weight 244 lb 14.9 oz (111.1 kg), SpO2 98.00%.    PE: Gen: NAD, Alert and Oriented, pale appearing HEENT:  [place]/AT, EOMI Neck: Supple, no LAD Lungs: CTA Bilaterally CV: RRR without M/G/R ABM: Soft, NT, distended, +BS Ext: No C/C/E  Assessment/Plan: 1) Anemia. 2) Elevated liver enzymes. 3) CHF.   The patient has symptomatic anemia and I will perform further evaluation with an EGD.  In his condition I am reluctant to have him undergo a colonoscopy.  He states he had a normal colonoscopy 5-6 years ago.  As for his abdominal distension and elevated liver enzymes/TB, this can be from CHF or cirrhosis.  Further evaluation with an ultrasound will be pursued.  If he has ascites, which appears to be the case a diagnostic paracentesis will be beneficial to determine the source.  Plan: 1) EGD tomorrow. 2) Abdominal ultrasound. 3) Follow HGB and transfuse as necessary.  Skiler Tye D 11/15/2013, 12:34 PM

## 2013-11-15 NOTE — Progress Notes (Signed)
TELEMETRY: Reviewed telemetry pt in Afib with intermittent V pacing rate 70 bpm: Filed Vitals:   11/15/13 0400 11/15/13 0738 11/15/13 0820 11/15/13 1105  BP: 90/51  110/65 95/77  Pulse: 71 71 76 72  Temp: 98.3 F (36.8 C)  97.5 F (36.4 C) 97.9 F (36.6 C)  TempSrc: Oral  Oral Oral  Resp: 16 19 16 24   Height:      Weight: 244 lb 14.9 oz (111.1 kg)     SpO2: 99% 91% 100% 98%    Intake/Output Summary (Last 24 hours) at 11/15/13 1123 Last data filed at 11/15/13 0600  Gross per 24 hour  Intake    400 ml  Output   1225 ml  Net   -825 ml   Filed Weights   11/13/13 0412 11/14/13 0500 11/15/13 0400  Weight: 249 lb 5.4 oz (113.1 kg) 248 lb 7.3 oz (112.7 kg) 244 lb 14.9 oz (111.1 kg)    Subjective Feels very weak, washed out. SOB better with oxygen on. No BM. Denies any recent blood in stool.   Marland Kitchen antiseptic oral rinse  7 mL Mouth Rinse BID  . aspirin EC  81 mg Oral Daily  . carvedilol  6.25 mg Oral BID WC  . clopidogrel  75 mg Oral Daily  . ferrous sulfate  325 mg Oral BID WC  . folic acid  1 mg Oral Daily  . furosemide  80 mg Intravenous Q12H  . LORazepam  0-4 mg Oral Q6H   Followed by  . LORazepam  0-4 mg Oral Q12H  . multivitamin with minerals  1 tablet Oral Daily  . pantoprazole  40 mg Oral BID AC  . potassium chloride  40 mEq Oral Daily  . thiamine  100 mg Oral Daily   Or  . thiamine  100 mg Intravenous Daily      LABS: Basic Metabolic Panel:  Recent Labs  16/10/96 1820  11/14/13 0259 11/15/13 0342  NA 136*  < > 141 141  K 3.5*  < > 3.6* 3.6*  CL 94*  < > 97 98  CO2 29  < > 31 33*  GLUCOSE 131*  < > 133* 116*  BUN 28*  < > 28* 27*  CREATININE 1.19  < > 1.24 1.17  CALCIUM 8.8  < > 8.7 8.8  MG 2.3  --   --   --   < > = values in this interval not displayed. Liver Function Tests:  Recent Labs  11/12/13 1820 11/15/13 0342  AST 57* 83*  ALT 87* 90*  ALKPHOS 217* 226*  BILITOT 2.8* 2.2*  PROT 6.5 5.9*  ALBUMIN 3.0* 2.8*   No results found for  this basename: LIPASE, AMYLASE,  in the last 72 hours CBC:  Recent Labs  11/12/13 1820  11/14/13 0259 11/15/13 0342  WBC 10.3  < > 12.1* 10.2  NEUTROABS 8.9*  --   --   --   HGB 6.3*  < > 7.3* 7.1*  HCT 24.4*  < > 27.0* 26.9*  MCV 64.7*  < > 68.2* 69.3*  PLT 375  < > 310 328  < > = values in this interval not displayed. Cardiac Enzymes:  Recent Labs  11/12/13 1820 11/13/13 0440  TROPONINI <0.30 <0.30   BNP:  Recent Labs  11/12/13 1820  PROBNP 3747.0*   D-Dimer: No results found for this basename: DDIMER,  in the last 72 hours Hemoglobin A1C: No results found for this basename: HGBA1C,  in  the last 72 hours Fasting Lipid Panel: No results found for this basename: CHOL, HDL, LDLCALC, TRIG, CHOLHDL, LDLDIRECT,  in the last 72 hours Thyroid Function Tests:  Recent Labs  11/12/13 1820  TSH 7.070*     Radiology/Studies:  CHEST 2 VIEW  COMPARISON: 09/12/2012  FINDINGS:  Since the prior study, opacity has developed in the lower lobe head  right lung base. This is consistent with a combination of a small a  moderate effusion with atelectasis or infiltrate. There is mild  atelectasis at the left lateral lung base. No left pleural effusion.  Cardiac silhouette is borderline enlarged. Changes from cardiac  surgery are stable. No mediastinal or hilar masses.  Stable well-positioned left anterior chest wall sequential  pacemaker.  IMPRESSION:  1. New, small to moderate, right pleural effusion with associated  atelectasis or infiltrate in the right lower lobe. No convincing  pulmonary edema.  Electronically Signed  By: Amie Portlandavid Ormond M.D.  On: 11/13/2013 09:44  Echo: 11/13/13:Study Conclusions  - Left ventricle: There is global hypokinesis with akinesis of the entire anterior wall and paradoxical septal motion (LBBB). The cavity size was moderately dilated. There was mild concentric hypertrophy. Systolic function was severely reduced. The estimated ejection  fraction was in the range of 25% to 30%. Wall motion was normal; there were no regional wall motion abnormalities. Doppler parameters are consistent with a restrictive pattern, indicative of decreased left ventricular diastolic compliance and/or increased left atrial pressure (grade 3 diastolic dysfunction). Doppler parameters are consistent with elevated ventricular end-diastolic filling pressure. - Ventricular septum: Septal motion showed paradox. - Aortic valve: Trileaflet; normal thickness leaflets. - Aortic root: The aortic root was normal in size. - Mitral valve: There was mild regurgitation. - Left atrium: The atrium was moderately to severely dilated. - Right ventricle: The cavity size was moderately dilated. Wall thickness was normal. Systolic function was moderately reduced. - Right atrium: The atrium was moderately dilated. - Pulmonary arteries: Systolic pressure was mildly increased. PA peak pressure: 42 mm Hg (S).  Impressions:  - There is significant change when compared to the prior study from 07/13/2012. Left ventricle is now moderately dilately with severely impaired systolic function and restrictive pattern of diastolic dysfunction. Right ventricle is moderately dilated with moderately decreased systolic function. At least mild pulmonary hypertension (IVC not visualized).    PHYSICAL EXAM General: Pale, chronically ill WM  in no acute distress. Jaundiced. Head: Normocephalic, atraumatic, sclera non-icteric, oropharynx is clear Neck: Negative for carotid bruits. JVD  Elevated 8 cm. No adenopathy Lungs: Decreased BS right base. Breathing is unlabored. Heart: IRRR S1 S2 without murmurs, rubs, or gallops.  Abdomen: Soft, non-tender, non-distended with normoactive bowel sounds.  No rebound/guarding. No obvious abdominal masses. Extremities: 1+ edema.  Distal pedal pulses are 2+ and equal bilaterally. Neuro: Alert and oriented X 3. Moves all extremities  spontaneously. Psych:  Responds to questions appropriately with a depressed affect.  ASSESSMENT AND PLAN: 1. Acute on chronic systolic CHF. EF 25-30%. Biventricular failure. Diuresing well on IV lasix. I/O negative 1185. Weight down 5 lbs. Breathing better. Will continue diuresis. On carvedilol. Intolerant to ACEi due to cough. Consider ARB but currently BP too soft to add.   2. Afib. Rate controlled. Not a candidate for anticoagulation so not a candidate for DCCV at this time.  3. Severe anemia. Norma Hgb one year ago. Stool heme check pending. Will check anemia panel and transfuse 2 more units of PRBCs today since he is symptomatid. Dr. Elnoria HowardHung consulted  for GI evaluation. Will hold ASA for now and continue Plavix only pending GI work up.  4. S/p BiV ICD  5. S/p AVR with tissue prosthesis.  6. CAD s/p CABG.  7. Abnormal LFTs. Possibly related to congestive heart failure versus Etoh.   Present on Admission:  . Acute on chronic systolic and diastolic heart failure, NYHA class 4 . LBBB (left bundle branch block) . Persistent atrial fibrillation . CAD (coronary artery disease) . Jaundice . Other encephalopathy . Biventricular implantable cardioverter-defibrillator in situ . Acute blood loss anemia  Signed, Peter [place], MDFACC 11/15/2013 11:23 AM

## 2013-11-16 ENCOUNTER — Encounter (HOSPITAL_COMMUNITY): Payer: Medicare HMO | Admitting: Anesthesiology

## 2013-11-16 ENCOUNTER — Inpatient Hospital Stay (HOSPITAL_COMMUNITY): Payer: Medicare HMO

## 2013-11-16 ENCOUNTER — Encounter (HOSPITAL_COMMUNITY): Payer: Self-pay | Admitting: Anesthesiology

## 2013-11-16 ENCOUNTER — Inpatient Hospital Stay (HOSPITAL_COMMUNITY): Payer: Medicare HMO | Admitting: Anesthesiology

## 2013-11-16 ENCOUNTER — Encounter (HOSPITAL_COMMUNITY)
Admission: AD | Disposition: A | Discharge status: Hospice | Payer: Self-pay | Source: Ambulatory Visit | Attending: Cardiology

## 2013-11-16 HISTORY — PX: ESOPHAGOGASTRODUODENOSCOPY: SHX5428

## 2013-11-16 LAB — TYPE AND SCREEN
ABO/RH(D): A POS
Antibody Screen: NEGATIVE
UNIT DIVISION: 0
Unit division: 0
Unit division: 0
Unit division: 0

## 2013-11-16 LAB — COMPREHENSIVE METABOLIC PANEL
ALT: 110 U/L — AB (ref 0–53)
AST: 103 U/L — AB (ref 0–37)
Albumin: 2.8 g/dL — ABNORMAL LOW (ref 3.5–5.2)
Alkaline Phosphatase: 228 U/L — ABNORMAL HIGH (ref 39–117)
Anion gap: 12 (ref 5–15)
BILIRUBIN TOTAL: 3 mg/dL — AB (ref 0.3–1.2)
BUN: 23 mg/dL (ref 6–23)
CHLORIDE: 97 meq/L (ref 96–112)
CO2: 32 meq/L (ref 19–32)
Calcium: 8.4 mg/dL (ref 8.4–10.5)
Creatinine, Ser: 1.07 mg/dL (ref 0.50–1.35)
GFR calc Af Amer: 79 mL/min — ABNORMAL LOW (ref >90)
GFR calc non Af Amer: 68 mL/min — ABNORMAL LOW (ref >90)
Glucose, Bld: 108 mg/dL — ABNORMAL HIGH (ref 70–99)
Potassium: 3.3 mEq/L — ABNORMAL LOW (ref 3.7–5.3)
SODIUM: 141 meq/L (ref 137–147)
Total Protein: 5.9 g/dL — ABNORMAL LOW (ref 6.0–8.3)

## 2013-11-16 LAB — CBC
HCT: 32.6 % — ABNORMAL LOW (ref 39.0–52.0)
Hemoglobin: 9.3 g/dL — ABNORMAL LOW (ref 13.0–17.0)
MCH: 20.8 pg — ABNORMAL LOW (ref 26.0–34.0)
MCHC: 28.5 g/dL — ABNORMAL LOW (ref 30.0–36.0)
MCV: 72.8 fL — AB (ref 78.0–100.0)
PLATELETS: 282 10*3/uL (ref 150–400)
RBC: 4.48 MIL/uL (ref 4.22–5.81)
RDW: 24.5 % — AB (ref 11.5–15.5)
WBC: 9.8 10*3/uL (ref 4.0–10.5)

## 2013-11-16 SURGERY — EGD (ESOPHAGOGASTRODUODENOSCOPY)
Anesthesia: Monitor Anesthesia Care

## 2013-11-16 MED ORDER — POTASSIUM CHLORIDE CRYS ER 20 MEQ PO TBCR
40.0000 meq | EXTENDED_RELEASE_TABLET | Freq: Once | ORAL | Status: AC
  Administered 2013-11-16: 40 meq via ORAL
  Filled 2013-11-16: qty 2

## 2013-11-16 MED ORDER — ONDANSETRON HCL 4 MG/2ML IJ SOLN
INTRAMUSCULAR | Status: DC | PRN
  Administered 2013-11-16: 4 mg via INTRAVENOUS

## 2013-11-16 MED ORDER — LACTATED RINGERS IV SOLN
INTRAVENOUS | Status: DC | PRN
  Administered 2013-11-16: 11:00:00 via INTRAVENOUS

## 2013-11-16 MED ORDER — BUTAMBEN-TETRACAINE-BENZOCAINE 2-2-14 % EX AERO
INHALATION_SPRAY | CUTANEOUS | Status: DC | PRN
  Administered 2013-11-16: 2 via TOPICAL

## 2013-11-16 MED ORDER — FENTANYL CITRATE 0.05 MG/ML IJ SOLN
INTRAMUSCULAR | Status: DC | PRN
  Administered 2013-11-16: 50 ug via INTRAVENOUS

## 2013-11-16 MED ORDER — PROPOFOL INFUSION 10 MG/ML OPTIME
INTRAVENOUS | Status: DC | PRN
  Administered 2013-11-16: 25 ug/kg/min via INTRAVENOUS

## 2013-11-16 MED ORDER — MIDAZOLAM HCL 5 MG/5ML IJ SOLN
INTRAMUSCULAR | Status: DC | PRN
  Administered 2013-11-16: 1 mg via INTRAVENOUS

## 2013-11-16 NOTE — Progress Notes (Signed)
I reviewed his ultrasound and there is evidence of cirrhosis.  He has some ascites but it is at the dome of the liver and not amenable to evaluation with a paracentesis.  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

## 2013-11-16 NOTE — Transfer of Care (Signed)
Immediate Anesthesia Transfer of Care Note  Patient: [name]  Procedure(s) Performed: Procedure(s): ESOPHAGOGASTRODUODENOSCOPY (EGD) (N/A)  Patient Location: PACU  Anesthesia Type:MAC Level of Consciousness: awake, alert  and oriented  Airway & Oxygen Therapy: Patient Spontanous Breathing and Patient connected to face mask oxygen  Post-op Assessment: Report given to PACU RN  Post vital signs: Reviewed and stable  Complications: No apparent anesthesia complications

## 2013-11-16 NOTE — Progress Notes (Signed)
TELEMETRY: Reviewed telemetry pt in Afib with intermittent V pacing rate controlled: Filed Vitals:   11/16/13 0737 11/16/13 1017 11/16/13 1147 11/16/13 1151  BP: 121/71 122/80 84/71 107/81  Pulse:  76 73 73  Temp: 98.5 F (36.9 C) 97.7 F (36.5 C) 97.7 F (36.5 C)   TempSrc: Oral Oral    Resp: 19 19 22 19   Height:      Weight:      SpO2: 96% 98% 98% 98%    Intake/Output Summary (Last 24 hours) at 11/16/13 1159 Last data filed at 11/16/13 1148  Gross per 24 hour  Intake 755.83 ml  Output   1170 ml  Net -414.17 ml   Filed Weights   11/14/13 0500 11/15/13 0400 11/16/13 0500  Weight: 248 lb 7.3 oz (112.7 kg) 244 lb 14.9 oz (111.1 kg) 247 lb 9.2 oz (112.3 kg)    Subjective Seen post EGD and still sedated.  . sodium chloride   Intravenous Once  . antiseptic oral rinse  7 mL Mouth Rinse BID  . carvedilol  6.25 mg Oral BID WC  . clopidogrel  75 mg Oral Daily  . ferrous sulfate  325 mg Oral BID WC  . folic acid  1 mg Oral Daily  . furosemide  80 mg Intravenous Q12H  . LORazepam  0-4 mg Oral Q12H  . multivitamin with minerals  1 tablet Oral Daily  . pantoprazole  40 mg Oral BID AC  . potassium chloride  40 mEq Oral Daily  . thiamine  100 mg Oral Daily   Or  . thiamine  100 mg Intravenous Daily   . sodium chloride 20 mL/hr at 11/15/13 1935    LABS: Basic Metabolic Panel:  Recent Labs  97/74/14 0342 11/16/13 0215  NA 141 141  K 3.6* 3.3*  CL 98 97  CO2 33* 32  GLUCOSE 116* 108*  BUN 27* 23  CREATININE 1.17 1.07  CALCIUM 8.8 8.4   Liver Function Tests:  Recent Labs  11/15/13 0342 11/16/13 0215  AST 83* 103*  ALT 90* 110*  ALKPHOS 226* 228*  BILITOT 2.2* 3.0*  PROT 5.9* 5.9*  ALBUMIN 2.8* 2.8*   No results found for this basename: LIPASE, AMYLASE,  in the last 72 hours CBC:  Recent Labs  11/15/13 0342 11/16/13 0215  WBC 10.2 9.8  HGB 7.1* 9.3*  HCT 26.9* 32.6*  MCV 69.3* 72.8*  PLT 328 282   Cardiac Enzymes: No results found for this  basename: CKTOTAL, CKMB, CKMBINDEX, TROPONINI,  in the last 72 hours BNP: No results found for this basename: PROBNP,  in the last 72 hours D-Dimer: No results found for this basename: DDIMER,  in the last 72 hours Hemoglobin A1C: No results found for this basename: HGBA1C,  in the last 72 hours Fasting Lipid Panel: No results found for this basename: CHOL, HDL, LDLCALC, TRIG, CHOLHDL, LDLDIRECT,  in the last 72 hours Thyroid Function Tests: No results found for this basename: TSH, T4TOTAL, FREET3, T3FREE, THYROIDAB,  in the last 72 hours   Radiology/Studies:  CHEST 2 VIEW  COMPARISON: 09/12/2012  FINDINGS:  Since the prior study, opacity has developed in the lower lobe head  right lung base. This is consistent with a combination of a small a  moderate effusion with atelectasis or infiltrate. There is mild  atelectasis at the left lateral lung base. No left pleural effusion.  Cardiac silhouette is borderline enlarged. Changes from cardiac  surgery are stable. No mediastinal or hilar  masses.  Stable well-positioned left anterior chest wall sequential  pacemaker.  IMPRESSION:  1. New, small to moderate, right pleural effusion with associated  atelectasis or infiltrate in the right lower lobe. No convincing  pulmonary edema.  Electronically Signed  By: Amie Portland M.D.  On: 11/13/2013 09:44  ULTRASOUND ABDOMEN COMPLETE  COMPARISON: None.  FINDINGS:  Gallbladder:  Decompressed with evidence of cholelithiasis. No pericholecystic  fluid is noted.  Common bile duct:  Diameter: 6.9 mm.  Liver:  Mild nodularity is noted consistent with early cirrhosis. No focal  mass lesion is seen.  IVC:  No abnormality visualized.  Pancreas:  Visualized portion unremarkable.  Spleen:  Size and appearance within normal limits.  Right Kidney:  Length: 11.3 cm. Echogenicity within normal limits. No mass or  hydronephrosis visualized.  Left Kidney:  Length: 11.4 cm. Echogenicity within  normal limits. No mass or  hydronephrosis visualized.  Abdominal aorta:  No aneurysm visualized.  Other findings:  Mild ascites.  IMPRESSION:  Mild cirrhotic change with ascites.  Cholelithiasis in a decompressed gallbladder.  Electronically Signed  By: Alcide Clever M.D.  On: 11/16/2013 09:52   Echo: 11/13/13:Study Conclusions  - Left ventricle: There is global hypokinesis with akinesis of the entire anterior wall and paradoxical septal motion (LBBB). The cavity size was moderately dilated. There was mild concentric hypertrophy. Systolic function was severely reduced. The estimated ejection fraction was in the range of 25% to 30%. Wall motion was normal; there were no regional wall motion abnormalities. Doppler parameters are consistent with a restrictive pattern, indicative of decreased left ventricular diastolic compliance and/or increased left atrial pressure (grade 3 diastolic dysfunction). Doppler parameters are consistent with elevated ventricular end-diastolic filling pressure. - Ventricular septum: Septal motion showed paradox. - Aortic valve: Trileaflet; normal thickness leaflets. - Aortic root: The aortic root was normal in size. - Mitral valve: There was mild regurgitation. - Left atrium: The atrium was moderately to severely dilated. - Right ventricle: The cavity size was moderately dilated. Wall thickness was normal. Systolic function was moderately reduced. - Right atrium: The atrium was moderately dilated. - Pulmonary arteries: Systolic pressure was mildly increased. PA peak pressure: 42 mm Hg (S).  Impressions:  - There is significant change when compared to the prior study from 07/13/2012. Left ventricle is now moderately dilately with severely impaired systolic function and restrictive pattern of diastolic dysfunction. Right ventricle is moderately dilated with moderately decreased systolic function. At least mild pulmonary hypertension (IVC not  visualized).  EGD: portal gastropathy without bleeding. ? Small duodenal AVM.  PHYSICAL EXAM General: Pale, chronically ill WM  in no acute distress. Jaundiced. Head: Normocephalic, atraumatic, sclera non-icteric, oropharynx is clear Neck: Negative for carotid bruits. JVD  Elevated 8 cm. No adenopathy Lungs: Decreased BS right base. Breathing is unlabored. Heart: IRRR S1 S2 without murmurs, rubs, or gallops.  Abdomen: Soft, non-tender, non-distended with normoactive bowel sounds.  No rebound/guarding. No obvious abdominal masses. Extremities: 1+ edema.  Distal pedal pulses are 2+ and equal bilaterally. Neuro: Alert and oriented X 3. Moves all extremities spontaneously. Psych:  Responds to questions appropriately with a depressed affect.  ASSESSMENT AND PLAN: 1. Acute on chronic systolic CHF. EF 25-30%. Biventricular failure. Diuresing well on IV lasix. I/O negative 964. Weight up post transfusion.  Will continue diuresis. On carvedilol. Intolerant to ACEi due to cough. Will add low dose ARB.  2. Afib. Rate controlled. Not a candidate for anticoagulation so not a candidate for DCCV at this time.  3. Severe  anemia. No active bleeding. EGD negative for bleeding source.  Stool heme check pending. HGB improved post transfusion. Will resume ASA for now and continue Plavix. Appreciate GI evaluation.   4. S/p BiV ICD  5. S/p AVR with tissue prosthesis.  6. CAD s/p CABG.  7. Abnormal LFTs. 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 Too small to tap.   Present on Admission:  . Acute on chronic systolic and diastolic heart failure, NYHA class 4 . LBBB (left bundle branch block) . Persistent atrial fibrillation . CAD (coronary artery disease) . Jaundice . Other encephalopathy . Biventricular implantable cardioverter-defibrillator in situ . Acute blood loss anemia  Signed, [name], MDFACC 11/16/2013 11:59 AM

## 2013-11-16 NOTE — H&P (View-Only) (Signed)
Unassigned Consult  Reason for Consult: Anemia and abnormal liver enzymes Referring Physician: Cardiology  Harl Favor HPI: This is a 73 year old male with a PMH of CHF, CAD, ischemic cardiomyopathy, s/p aortic valve replacement, and HTN who is admitted for CHF.  The patient states that he has been feeling poorly for the past 6 months with fatigue, orthopnea, and PND.  There has been swelling in his legs and abdominal distension and he reports issues with orange urine.  Also, there is a change in his skin color, i.e, it appears jaundiced.  Further evaluation revealed that his HGB has dropped down into the 6 range, but he has a history of anemia.  One year ago he had a normal value, but he has a longer baseline history of HGB in the 8-10 range. His liver panel also reveals a marked elevation in his AP and some mild elevation in his AST/ALT, and TB.  The patient states that he drinks 1-2 drinks per day.  Initially he was evaluated by Pulmonary and then he was sent to Cardiology only to be admitted because of his condition.  Past Medical History  Diagnosis Date  . Nonischemic cardiomyopathy     EF 25-30%  . Valvular cardiomyopathy   . Aortic valve replaced     x2 2002 & 2009 Medtronic free style aortic root  . CAD (coronary artery disease)     s/p CABG  . LBBB (left bundle branch block)     QRS  . CHF (congestive heart failure)   . Hypertension   . Acute blood loss anemia 11/14/2013    Past Surgical History  Procedure Laterality Date  . Aortic valve replacement      s/p 2 separate AVR procedures, an initial mechanical AVR was replaced with a Medtronic Freestyle root in 2009 with reimplantation of the cors as well as redo SVG to RCA and preservation of LIMA to LAD  . Coronary artery bypass graft      2002  . Biv icd implant  08/16/11    SJM BiV ICD implanted by Dr Johney Frame  . Cardiac valve replacement      Family History  Problem Relation Age of Onset  . Diabetes Mother   .  Hypertension Mother   . Heart attack Father   . Heart attack Brother   . Diabetes Brother   . Cancer Sister   . Diabetes Sister   . Hypertension Sister     Social History:  reports that he quit smoking about 21 years ago. His smoking use included Cigarettes. He has a 54 pack-year smoking history. He has never used smokeless tobacco. He reports that he drinks alcohol. He reports that he does not use illicit drugs.  Allergies:  Allergies  Allergen Reactions  . Ace Inhibitors Cough    Medications:  Scheduled: . sodium chloride   Intravenous Once  . antiseptic oral rinse  7 mL Mouth Rinse BID  . carvedilol  6.25 mg Oral BID WC  . clopidogrel  75 mg Oral Daily  . ferrous sulfate  325 mg Oral BID WC  . folic acid  1 mg Oral Daily  . furosemide  80 mg Intravenous Q12H  . LORazepam  0-4 mg Oral Q6H   Followed by  . LORazepam  0-4 mg Oral Q12H  . multivitamin with minerals  1 tablet Oral Daily  . pantoprazole  40 mg Oral BID AC  . potassium chloride  40 mEq Oral Daily  .  thiamine  100 mg Oral Daily   Or  . thiamine  100 mg Intravenous Daily   Continuous:   Results for orders placed during the hospital encounter of 11/12/13 (from the past 24 hour(s))  CBC     Status: Abnormal   Collection Time    11/15/13  3:42 AM      Result Value Ref Range   WBC 10.2  4.0 - 10.5 K/uL   RBC 3.88 (*) 4.22 - 5.81 MIL/uL   Hemoglobin 7.1 (*) 13.0 - 17.0 g/dL   HCT 26.9 (*) 39.0 - 52.0 %   MCV 69.3 (*) 78.0 - 100.0 fL   MCH 18.3 (*) 26.0 - 34.0 pg   MCHC 26.4 (*) 30.0 - 36.0 g/dL   RDW 23.9 (*) 11.5 - 15.5 %   Platelets 328  150 - 400 K/uL  COMPREHENSIVE METABOLIC PANEL     Status: Abnormal   Collection Time    11/15/13  3:42 AM      Result Value Ref Range   Sodium 141  137 - 147 mEq/L   Potassium 3.6 (*) 3.7 - 5.3 mEq/L   Chloride 98  96 - 112 mEq/L   CO2 33 (*) 19 - 32 mEq/L   Glucose, Bld 116 (*) 70 - 99 mg/dL   BUN 27 (*) 6 - 23 mg/dL   Creatinine, Ser 1.17  0.50 - 1.35 mg/dL    Calcium 8.8  8.4 - 10.5 mg/dL   Total Protein 5.9 (*) 6.0 - 8.3 g/dL   Albumin 2.8 (*) 3.5 - 5.2 g/dL   AST 83 (*) 0 - 37 U/L   ALT 90 (*) 0 - 53 U/L   Alkaline Phosphatase 226 (*) 39 - 117 U/L   Total Bilirubin 2.2 (*) 0.3 - 1.2 mg/dL   GFR calc non Af Amer 61 (*) >90 mL/min   GFR calc Af Amer 71 (*) >90 mL/min   Anion gap 10  5 - 15  PREPARE RBC (CROSSMATCH)     Status: None   Collection Time    11/15/13 12:00 PM      Result Value Ref Range   Order Confirmation ORDER PROCESSED BY BLOOD BANK       No results found.  ROS:  As stated above in the HPI otherwise negative.  Blood pressure 95/77, pulse 72, temperature 97.9 F (36.6 C), temperature source Oral, resp. rate 24, height 6' 1" (1.854 m), weight 244 lb 14.9 oz (111.1 kg), SpO2 98.00%.    PE: Gen: NAD, Alert and Oriented, pale appearing HEENT:  [place]/AT, EOMI Neck: Supple, no LAD Lungs: CTA Bilaterally CV: RRR without M/G/R ABM: Soft, NT, distended, +BS Ext: No C/C/E  Assessment/Plan: 1) Anemia. 2) Elevated liver enzymes. 3) CHF.   The patient has symptomatic anemia and I will perform further evaluation with an EGD.  In his condition I am reluctant to have him undergo a colonoscopy.  He states he had a normal colonoscopy 5-6 years ago.  As for his abdominal distension and elevated liver enzymes/TB, this can be from CHF or cirrhosis.  Further evaluation with an ultrasound will be pursued.  If he has ascites, which appears to be the case a diagnostic paracentesis will be beneficial to determine the source.  Plan: 1) EGD tomorrow. 2) Abdominal ultrasound. 3) Follow HGB and transfuse as necessary.  Keyleigh Manninen D 11/15/2013, 12:34 PM      

## 2013-11-16 NOTE — Interval H&P Note (Signed)
History and Physical Interval Note:  11/16/2013 11:17 AM  [name]  has presented today for surgery, with the diagnosis of Anemia/Melena  The various methods of treatment have been discussed with the patient and family. After consideration of risks, benefits and other options for treatment, the patient has consented to  Procedure(s): ESOPHAGOGASTRODUODENOSCOPY (EGD) (N/A) as a surgical intervention .  The patient's history has been reviewed, patient examined, no change in status, stable for surgery.  I have reviewed the patient's chart and labs.  Questions were answered to the patient's satisfaction.     Abbey Veith D

## 2013-11-16 NOTE — Anesthesia Preprocedure Evaluation (Signed)
Anesthesia Evaluation  Patient identified by MRN, date of birth, ID band Patient awake    Reviewed: Allergy & Precautions, H&P , NPO status , Patient's Chart, lab work & pertinent test results, reviewed documented beta blocker date and time   Airway Mallampati: II TM Distance: >3 FB Neck ROM: Full    Dental  (+) Edentulous Upper, Edentulous Lower   Pulmonary shortness of breath and at rest, former smoker,          Cardiovascular hypertension, Pt. on medications + CAD, + CABG and +CHF + dysrhythmias Atrial Fibrillation + pacemaker + Cardiac Defibrillator Rhythm:Irregular     Neuro/Psych    GI/Hepatic   Endo/Other    Renal/GU      Musculoskeletal   Abdominal   Peds  Hematology  (+) anemia ,   Anesthesia Other Findings   Reproductive/Obstetrics                           Anesthesia Physical Anesthesia Plan  ASA: IV  Anesthesia Plan: MAC   Post-op Pain Management:    Induction: Intravenous  Airway Management Planned: Mask  Additional Equipment:   Intra-op Plan:   Post-operative Plan:   Informed Consent: I have reviewed the patients History and Physical, chart, labs and discussed the procedure including the risks, benefits and alternatives for the proposed anesthesia with the patient or authorized representative who has indicated his/her understanding and acceptance.   Dental advisory given  Plan Discussed with: CRNA, Anesthesiologist and Surgeon  Anesthesia Plan Comments:         Anesthesia Quick Evaluation

## 2013-11-16 NOTE — Care Management Note (Addendum)
Page 1 of 2   11/25/2013     2:46:42 PM CARE MANAGEMENT NOTE 11/25/2013  Patient:  [name], [name]   Account Number:  [id number]  Date Initiated:  11/13/2013  Documentation initiated by:  [facility]  Subjective/Objective Assessment:   Admitted with CHF, liver failure.     Action/Plan:   home with hospice   Anticipated DC Date:     Anticipated DC Plan:  [place] referral  Clinical Social Worker      DC Planning Services  CM consult      Choice offered to / List presented to:  C-3 Spouse           HH agency  [facility] [place]   Status of service:  Completed, signed off Medicare Important Message given?  YES (If response is "NO", the following Medicare IM given date fields will be blank) Date Medicare IM given:  11/22/2013 Medicare IM given by:  HUTCHINSON,CRYSTAL Date Additional Medicare IM given:  11/23/2013 Additional Medicare IM given by:  CRYSTAL HUTCHINSON  Discharge Disposition:    Per UR Regulation:  Reviewed for med. necessity/level of care/duration of stay  If discussed at [place] of Stay Meetings, dates discussed:   11/20/2013    Comments:  11/25/13 14:00 CM received call from Charge RN stating Dvonte Kramer is very upset pt is not coming home.  CM called Derwood Kaplan and reminded her of our conversation earlier and Dr. Delanna Ahmadi recc. of discharge on the same day of admission to Hospice (tomorrow) and Peter Congo states, I don't give a ****, nothing is going to happen tonight and I want him home.  CM relayed message to RN who called discharging MD and requested I call for ambulance transport.  CM called CSW [facility] to arrange transport home. No other CM needs were communicated.  Mariane Masters, BSN, CM [phone]. 12:30 CM received a call back from [facility] who states no referral has been made on this pt.  Bonnie requests facesheet, and other pertinent information be faxed to [phone] and admission can take place tomorrow 11/26/13 (no  admissions on the weekends).  CM also notes Palliative CARE MD instructs pt can be discharged if admitted on the same day to Hospice.  CM notified RN.  CM faxed facesheet, Palliative Care notes, DC Summary, H&P. Per Bonnie's request, CM called family and left voicemail with [name] will call them tomorrow 11/26/13 and admit pt tomorrow.   No other CM needs were communicated. Mariane Masters, BSN, CM [phone]. 12:08 CM was contacted by RN as she understood pt was ready for discharge and ambulance transport.  CM called [facility] to see if pt has been referred/accepted. Waiting for callback.  CM called wife of pt, [name] [phone] who states she has not heard from anyone at [facility] and understood this was to be arranged Monday 11/26/13.  CM will wait for callback from [facility] for clarification.  Mariane Masters, BSN, CM [phone].   Crystal Hutchinson RN, BSN, MSHL, CCM  Nurse - Case Manager,  (Unit [phone]  11/23/2013 Additional IM 11/23/2013 Patient and wife, DTR requesting [facility]. CM has notified Dr. Debara Pickett for clarification of d/c plan. Dr. Debara Pickett will discuss Hospice v HHS and LTG plans today. Update from Dr. Debara Pickett:  Plans to order a Hospice and Palliative Care Consult to discuss [place] Wife has list of providers for HHS but states has not decided yet.  Thus  far, family has only elected [facility]. Dispostion Plan:  Pending at this time. (HHS:  RN, [place])    Crystal Hutchinson RN, BSN, MSHL, CCM  Nurse - Case Manager,  (Unit [place])  [phone]  11/21/2013 Received to 3e CM Services  11/20/13 1417 Additional IM 11/21/2013 ADM:  afib, CHF Social:  From home with wife ([name]) Transportaiton:  Self or wife Meds:  Self managed PT RECS:  pending CM met with patient, wife and DTR at bedside to discuss HHS and provider election; list provided. DTR states interest in [facility] [place]. CM unaware of Hospice Referral. CM will f/u with MD re:  d/c disposition recommendations. Disposition Plan:  pending.   Contact:  Picardi,Gloria Spouse [phone]   [phone]                [name]   [phone]  11-16-13   9:30am   [place] [phone] GIB consulted for anemia and abnormal liver enzymes.  For EGD today.

## 2013-11-16 NOTE — Op Note (Signed)
Eligha Bridegroom [facility] [address] [place], [address]   OPERATIVE PROCEDURE REPORT  PATIENT: [name], [name]  MR#: [id number] BIRTHDATE: March 25, 1941  GENDER: Male ENDOSCOPIST: Jeani Hawking, MD ASSISTANT:   Nilsa Nutting, Endo Technician and Priscella Mann, technician PROCEDURE DATE: 11/16/2013 PROCEDURE:   EGD, diagnostic ASA CLASS:   Class III INDICATIONS:Anemia. MEDICATIONS: MAC sedation, administered by CRNA TOPICAL ANESTHETIC:   Cetacaine Spray  DESCRIPTION OF PROCEDURE:   After the risks benefits and alternatives of the procedure were thoroughly explained, informed consent was obtained.  The Pentax Gastroscope [id number]  endoscope was introduced through the mouth  and advanced to the second portion of the duodenum Without limitations.      The instrument was slowly withdrawn as the mucosa was fully examined.      FINDINGS: The esophagus was normal.  No evidence of any varices.  In the gastric lumen there was evidence of a portal HTN gastropathy, but no evidence of any bleeding.  Also, there was no evidence of any gastric varices.  The duodenum was essentially normal, but there may be a suspicion of an AVM.  Again, no stigmata of bleeding in this area.          The scope was then withdrawn from the patient and the procedure terminated.  COMPLICATIONS: There were no complications.  IMPRESSION: 1) Portal HTN gastropathy and no clear evidence of any bleeding source. 2) ? Duodenal AVM.  RECOMMENDATIONS: 1) Follow HGB. 2) Transfuse as necessary. 3) If anemia recurs and further evaluation is required, a colonoscopy can be pursued.  He is at high risk for complications with the procedure with his health. 4) Follow up in the office in 1 month.  _______________________________ eSigned:  Jeani Hawking, MD 11/16/2013 11:59 AM

## 2013-11-16 NOTE — Anesthesia Postprocedure Evaluation (Signed)
  Anesthesia Post-op Note  Patient: [name]  Procedure(s) Performed: Procedure(s): ESOPHAGOGASTRODUODENOSCOPY (EGD) (N/A)  Patient Location: PACU and Endoscopy Unit  Anesthesia Type:MAC  Level of Consciousness: awake  Airway and Oxygen Therapy: Patient Spontanous Breathing  Post-op Pain: mild  Post-op Assessment: Post-op Vital signs reviewed  Post-op Vital Signs: Reviewed  Last Vitals:  Filed Vitals:   11/16/13 1200  BP: 106/78  Pulse: 70  Temp:   Resp: 18    Complications: No apparent anesthesia complications

## 2013-11-17 LAB — COMPREHENSIVE METABOLIC PANEL
ALT: 126 U/L — ABNORMAL HIGH (ref 0–53)
AST: 116 U/L — ABNORMAL HIGH (ref 0–37)
Albumin: 2.9 g/dL — ABNORMAL LOW (ref 3.5–5.2)
Alkaline Phosphatase: 236 U/L — ABNORMAL HIGH (ref 39–117)
Anion gap: 11 (ref 5–15)
BILIRUBIN TOTAL: 2 mg/dL — AB (ref 0.3–1.2)
BUN: 25 mg/dL — AB (ref 6–23)
CHLORIDE: 97 meq/L (ref 96–112)
CO2: 32 mEq/L (ref 19–32)
CREATININE: 1.08 mg/dL (ref 0.50–1.35)
Calcium: 8.9 mg/dL (ref 8.4–10.5)
GFR, EST AFRICAN AMERICAN: 78 mL/min — AB (ref >90)
GFR, EST NON AFRICAN AMERICAN: 67 mL/min — AB (ref >90)
Glucose, Bld: 113 mg/dL — ABNORMAL HIGH (ref 70–99)
Potassium: 3.9 mEq/L (ref 3.7–5.3)
Sodium: 140 mEq/L (ref 137–147)
Total Protein: 6.2 g/dL (ref 6.0–8.3)

## 2013-11-17 LAB — CBC
HCT: 33.5 % — ABNORMAL LOW (ref 39.0–52.0)
Hemoglobin: 9.3 g/dL — ABNORMAL LOW (ref 13.0–17.0)
MCH: 20.3 pg — AB (ref 26.0–34.0)
MCHC: 27.8 g/dL — ABNORMAL LOW (ref 30.0–36.0)
MCV: 73.1 fL — AB (ref 78.0–100.0)
Platelets: 296 10*3/uL (ref 150–400)
RBC: 4.58 MIL/uL (ref 4.22–5.81)
RDW: 26 % — AB (ref 11.5–15.5)
WBC: 10.8 10*3/uL — ABNORMAL HIGH (ref 4.0–10.5)

## 2013-11-17 MED ORDER — OXYCODONE HCL 5 MG PO TABS
5.0000 mg | ORAL_TABLET | Freq: Once | ORAL | Status: AC
  Administered 2013-11-17: 5 mg via ORAL
  Filled 2013-11-17: qty 1

## 2013-11-17 MED ORDER — FUROSEMIDE 10 MG/ML IJ SOLN
80.0000 mg | Freq: Three times a day (TID) | INTRAMUSCULAR | Status: DC
  Administered 2013-11-17 – 2013-11-19 (×6): 80 mg via INTRAVENOUS
  Filled 2013-11-17 (×7): qty 8

## 2013-11-17 MED ORDER — LOSARTAN POTASSIUM 25 MG PO TABS
12.5000 mg | ORAL_TABLET | Freq: Two times a day (BID) | ORAL | Status: DC
  Administered 2013-11-17 – 2013-11-18 (×2): 12.5 mg via ORAL
  Filled 2013-11-17 (×5): qty 0.5

## 2013-11-17 NOTE — Progress Notes (Signed)
Condom cath leaking, new cath. Inserted , tolerated well.

## 2013-11-17 NOTE — Progress Notes (Signed)
IV team aware about the picc line insertion.

## 2013-11-17 NOTE — Progress Notes (Signed)
Patient ID: [name], male   DOB: 28-Jun-1940, 73 y.o.   MRN: [id number]   EGD yesterday, no definite source for bleeding.  Patient remains short of breath.  Had episode of "discomfort" in chest earlier this morning.    TELEMETRY: Reviewed telemetry pt in Afib with intermittent V pacing rate 70 bpm: Filed Vitals:   11/17/13 0537 11/17/13 0739 11/17/13 1000 11/17/13 1209  BP: 119/67 113/78  92/59  Pulse: 72 77 71 71  Temp:  98.6 F (37 C)  98.2 F (36.8 C)  TempSrc:  Oral  Oral  Resp: 19 17 18 31   Height:      Weight:      SpO2: 96% 97% 99% 100%    Intake/Output Summary (Last 24 hours) at 11/17/13 1323 Last data filed at 11/17/13 0900  Gross per 24 hour  Intake    320 ml  Output   1450 ml  Net  -1130 ml   Filed Weights   11/15/13 0400 11/16/13 0500 11/17/13 0324  Weight: 244 lb 14.9 oz (111.1 kg) 247 lb 9.2 oz (112.3 kg) 247 lb 5.7 oz (112.2 kg)     . sodium chloride   Intravenous Once  . antiseptic oral rinse  7 mL Mouth Rinse BID  . carvedilol  6.25 mg Oral BID WC  . clopidogrel  75 mg Oral Daily  . ferrous sulfate  325 mg Oral BID WC  . folic acid  1 mg Oral Daily  . furosemide  80 mg Intravenous 3 times per day  . LORazepam  0-4 mg Oral Q12H  . losartan  12.5 mg Oral BID  . multivitamin with minerals  1 tablet Oral Daily  . pantoprazole  40 mg Oral BID AC  . potassium chloride  40 mEq Oral Daily  . thiamine  100 mg Oral Daily   Or  . thiamine  100 mg Intravenous Daily      LABS: Basic Metabolic Panel:  Recent Labs  40/98/1106/10/25 0215 11/17/13 0220  NA 141 140  K 3.3* 3.9  CL 97 97  CO2 32 32  GLUCOSE 108* 113*  BUN 23 25*  CREATININE 1.07 1.08  CALCIUM 8.4 8.9   Liver Function Tests:  Recent Labs  11/16/13 0215 11/17/13 0220  AST 103* 116*  ALT 110* 126*  ALKPHOS 228* 236*  BILITOT 3.0* 2.0*  PROT 5.9* 6.2  ALBUMIN 2.8* 2.9*   No results found for this basename: LIPASE, AMYLASE,  in the last 72 hours CBC:  Recent Labs   11/16/13 0215 11/17/13 0220  WBC 9.8 10.8*  HGB 9.3* 9.3*  HCT 32.6* 33.5*  MCV 72.8* 73.1*  PLT 282 296   Cardiac Enzymes: No results found for this basename: CKTOTAL, CKMB, CKMBINDEX, TROPONINI,  in the last 72 hours BNP: No results found for this basename: PROBNP,  in the last 72 hours D-Dimer: No results found for this basename: DDIMER,  in the last 72 hours Hemoglobin A1C: No results found for this basename: HGBA1C,  in the last 72 hours Fasting Lipid Panel: No results found for this basename: CHOL, HDL, LDLCALC, TRIG, CHOLHDL, LDLDIRECT,  in the last 72 hours Thyroid Function Tests: No results found for this basename: TSH, T4TOTAL, FREET3, T3FREE, THYROIDAB,  in the last 72 hours   Radiology/Studies:  CHEST 2 VIEW  COMPARISON: 09/12/2012  FINDINGS:  Since the prior study, opacity has developed in the lower lobe head  right lung base. This is consistent with a combination of a  small a  moderate effusion with atelectasis or infiltrate. There is mild  atelectasis at the left lateral lung base. No left pleural effusion.  Cardiac silhouette is borderline enlarged. Changes from cardiac  surgery are stable. No mediastinal or hilar masses.  Stable well-positioned left anterior chest wall sequential  pacemaker.  IMPRESSION:  1. New, small to moderate, right pleural effusion with associated  atelectasis or infiltrate in the right lower lobe. No convincing  pulmonary edema.  Electronically Signed  By: Amie Portland M.D.  On: 11/13/2013 09:44  Echo: 11/13/13:Study Conclusions  - Left ventricle: There is global hypokinesis with akinesis of the entire anterior wall and paradoxical septal motion (LBBB). The cavity size was moderately dilated. There was mild concentric hypertrophy. Systolic function was severely reduced. The estimated ejection fraction was in the range of 25% to 30%. Wall motion was normal; there were no regional wall motion abnormalities. Doppler parameters are  consistent with a restrictive pattern, indicative of decreased left ventricular diastolic compliance and/or increased left atrial pressure (grade 3 diastolic dysfunction). Doppler parameters are consistent with elevated ventricular end-diastolic filling pressure. - Ventricular septum: Septal motion showed paradox. - Aortic valve: Trileaflet; normal thickness leaflets. - Aortic root: The aortic root was normal in size. - Mitral valve: There was mild regurgitation. - Left atrium: The atrium was moderately to severely dilated. - Right ventricle: The cavity size was moderately dilated. Wall thickness was normal. Systolic function was moderately reduced. - Right atrium: The atrium was moderately dilated. - Pulmonary arteries: Systolic pressure was mildly increased. PA peak pressure: 42 mm Hg (S).  Impressions:  - There is significant change when compared to the prior study from 07/13/2012. Left ventricle is now moderately dilately with severely impaired systolic function and restrictive pattern of diastolic dysfunction. Right ventricle is moderately dilated with moderately decreased systolic function. At least mild pulmonary hypertension (IVC not visualized).    PHYSICAL EXAM General: Pale, chronically ill WM  in no acute distress. Jaundiced. Head: Normocephalic, atraumatic, sclera non-icteric, oropharynx is clear Neck: Negative for carotid bruits. JVP 14 cm. No adenopathy Lungs: Decreased BS right base. Breathing is unlabored. Heart: IRRR S1 S2 without murmurs, rubs, or gallops.  Abdomen: Soft, non-tender, mildly disteneded with normoactive bowel sounds.  No rebound/guarding. No obvious abdominal masses. Extremities: 1+ edema to knees bilaterally.  Neuro: Alert and oriented X 3. Moves all extremities spontaneously. Psych:  Responds to questions appropriately with a depressed affect.  ASSESSMENT AND PLAN: 1. Acute on chronic systolic CHF: Ischemic cardiomyopathy.  Biventricular  failure, LV EF 25-30%. Has CRT-D system.  He is still very volume overloaded and has not made much progress with diuresis. Creatinine stable and weight basically unchanged.  Suspect atrial fibrillation is part of the problem with this CHF exacerbation as it is limiting CRT.  - Increase Lasix to 80 mg IV every 8 hours - Continue current Coreg.  - Will add low dose losartan.  - Will place PICC to follow CVP and co-ox.  If co-ox low, could benefit from milrinone to help facilitate diuresis.  2. Afib: Persistent, rate controlled. Not a candidate for anticoagulation at this time due to GI bleeding so not a candidate for DCCV. 3. Severe anemia: Status post transfusions. Dr. Elnoria Howard consulted for GI evaluation. EGD showed no definite bleeding source.  4. S/p AVR with tissue prosthesis. 5. CAD s/p CABG: He is on Plavix currently, hemoglobin stable today.  6. 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  LFTs elevated stably.   Signed,  Marca Ancona, MD 11/17/2013 1:23 PM

## 2013-11-18 ENCOUNTER — Inpatient Hospital Stay (HOSPITAL_COMMUNITY): Payer: Medicare HMO

## 2013-11-18 LAB — COMPREHENSIVE METABOLIC PANEL
ALBUMIN: 2.8 g/dL — AB (ref 3.5–5.2)
ALK PHOS: 221 U/L — AB (ref 39–117)
ALT: 121 U/L — AB (ref 0–53)
ANION GAP: 11 (ref 5–15)
AST: 98 U/L — AB (ref 0–37)
BILIRUBIN TOTAL: 1.6 mg/dL — AB (ref 0.3–1.2)
BUN: 23 mg/dL (ref 6–23)
CHLORIDE: 96 meq/L (ref 96–112)
CO2: 34 mEq/L — ABNORMAL HIGH (ref 19–32)
Calcium: 8.7 mg/dL (ref 8.4–10.5)
Creatinine, Ser: 1.1 mg/dL (ref 0.50–1.35)
GFR, EST AFRICAN AMERICAN: 76 mL/min — AB (ref >90)
GFR, EST NON AFRICAN AMERICAN: 66 mL/min — AB (ref >90)
Glucose, Bld: 179 mg/dL — ABNORMAL HIGH (ref 70–99)
POTASSIUM: 3.4 meq/L — AB (ref 3.7–5.3)
SODIUM: 141 meq/L (ref 137–147)
Total Protein: 5.9 g/dL — ABNORMAL LOW (ref 6.0–8.3)

## 2013-11-18 LAB — CBC
HCT: 32.5 % — ABNORMAL LOW (ref 39.0–52.0)
Hemoglobin: 8.9 g/dL — ABNORMAL LOW (ref 13.0–17.0)
MCH: 20.3 pg — ABNORMAL LOW (ref 26.0–34.0)
MCHC: 27.4 g/dL — ABNORMAL LOW (ref 30.0–36.0)
MCV: 74.2 fL — ABNORMAL LOW (ref 78.0–100.0)
PLATELETS: 272 10*3/uL (ref 150–400)
RBC: 4.38 MIL/uL (ref 4.22–5.81)
RDW: 27.1 % — AB (ref 11.5–15.5)
WBC: 9.7 10*3/uL (ref 4.0–10.5)

## 2013-11-18 LAB — CARBOXYHEMOGLOBIN
Carboxyhemoglobin: 2.4 % — ABNORMAL HIGH (ref 0.5–1.5)
Methemoglobin: 0.8 % (ref 0.0–1.5)
O2 SAT: 62.4 %
Total hemoglobin: 9.2 g/dL — ABNORMAL LOW (ref 13.5–18.0)

## 2013-11-18 MED ORDER — POTASSIUM CHLORIDE CRYS ER 20 MEQ PO TBCR
60.0000 meq | EXTENDED_RELEASE_TABLET | Freq: Every day | ORAL | Status: DC
Start: 2013-11-19 — End: 2013-11-21
  Administered 2013-11-19 – 2013-11-20 (×2): 60 meq via ORAL
  Filled 2013-11-18 (×4): qty 3

## 2013-11-18 MED ORDER — POTASSIUM CHLORIDE CRYS ER 20 MEQ PO TBCR: 60.0000 meq | EXTENDED_RELEASE_TABLET | Freq: Two times a day (BID) | ORAL | Status: DC

## 2013-11-18 MED ORDER — SODIUM CHLORIDE 0.9 % IJ SOLN
10.0000 mL | INTRAMUSCULAR | Status: DC | PRN
  Administered 2013-11-22: 30 mL
  Administered 2013-11-22: 10 mL
  Administered 2013-11-23 – 2013-11-24 (×4): 30 mL

## 2013-11-18 MED ORDER — SODIUM CHLORIDE 0.9 % IJ SOLN
10.0000 mL | Freq: Two times a day (BID) | INTRAMUSCULAR | Status: DC
  Administered 2013-11-18: 30 mL
  Administered 2013-11-18 – 2013-11-24 (×7): 10 mL

## 2013-11-18 NOTE — Progress Notes (Signed)
Patient ID: [name], male   DOB: 09-04-1940, 73 y.o.   MRN: [id number]  SUBJECTIVE: Hypotensive today. Losartan added yesterday.  Diuresed another -1.5L negative yesterday. Hypokalemic again today.  H/H stable.  TELEMETRY: Reviewed telemetry pt in Afib with intermittent V pacing rate 70 bpm: Filed Vitals:   11/18/13 0500 11/18/13 0810 11/18/13 0856 11/18/13 1214  BP:  94/50  91/50  Pulse:   70   Temp:  98 F (36.7 C)  98.2 F (36.8 C)  TempSrc:    Oral  Resp:  17  22  Height:      Weight: 243 lb 6.2 oz (110.4 kg)     SpO2:  98%  92%    Intake/Output Summary (Last 24 hours) at 11/18/13 1310 Last data filed at 11/18/13 0200  Gross per 24 hour  Intake    350 ml  Output   1400 ml  Net  -1050 ml   Filed Weights   11/16/13 0500 11/17/13 0324 11/18/13 0500  Weight: 247 lb 9.2 oz (112.3 kg) 247 lb 5.7 oz (112.2 kg) 243 lb 6.2 oz (110.4 kg)     . sodium chloride   Intravenous Once  . antiseptic oral rinse  7 mL Mouth Rinse BID  . carvedilol  6.25 mg Oral BID WC  . clopidogrel  75 mg Oral Daily  . ferrous sulfate  325 mg Oral BID WC  . folic acid  1 mg Oral Daily  . furosemide  80 mg Intravenous 3 times per day  . losartan  12.5 mg Oral BID  . multivitamin with minerals  1 tablet Oral Daily  . pantoprazole  40 mg Oral BID AC  . potassium chloride  40 mEq Oral Daily  . sodium chloride  10-40 mL Intracatheter Q12H  . thiamine  100 mg Oral Daily   Or  . thiamine  100 mg Intravenous Daily      LABS: Basic Metabolic Panel:  Recent Labs  57/84/69 0220 11/18/13 0250  NA 140 141  K 3.9 3.4*  CL 97 96  CO2 32 34*  GLUCOSE 113* 179*  BUN 25* 23  CREATININE 1.08 1.10  CALCIUM 8.9 8.7   Liver Function Tests:  Recent Labs  11/17/13 0220 11/18/13 0250  AST 116* 98*  ALT 126* 121*  ALKPHOS 236* 221*  BILITOT 2.0* 1.6*  PROT 6.2 5.9*  ALBUMIN 2.9* 2.8*   No results found for this basename: LIPASE, AMYLASE,  in the last 72 hours CBC:  Recent Labs  11/17/13 0220 11/18/13 0250  WBC 10.8* 9.7  HGB 9.3* 8.9*  HCT 33.5* 32.5*  MCV 73.1* 74.2*  PLT 296 272   Cardiac Enzymes: No results found for this basename: CKTOTAL, CKMB, CKMBINDEX, TROPONINI,  in the last 72 hours BNP: No results found for this basename: PROBNP,  in the last 72 hours D-Dimer: No results found for this basename: DDIMER,  in the last 72 hours Hemoglobin A1C: No results found for this basename: HGBA1C,  in the last 72 hours Fasting Lipid Panel: No results found for this basename: CHOL, HDL, LDLCALC, TRIG, CHOLHDL, LDLDIRECT,  in the last 72 hours Thyroid Function Tests: No results found for this basename: TSH, T4TOTAL, FREET3, T3FREE, THYROIDAB,  in the last 72 hours   Radiology/Studies:  CHEST 2 VIEW  COMPARISON: 09/12/2012  FINDINGS:  Since the prior study, opacity has developed in the lower lobe head  right lung base. This is consistent with a combination of a small a  moderate effusion with atelectasis or infiltrate. There is mild  atelectasis at the left lateral lung base. No left pleural effusion.  Cardiac silhouette is borderline enlarged. Changes from cardiac  surgery are stable. No mediastinal or hilar masses.  Stable well-positioned left anterior chest wall sequential  pacemaker.  IMPRESSION:  1. New, small to moderate, right pleural effusion with associated  atelectasis or infiltrate in the right lower lobe. No convincing  pulmonary edema.  Electronically Signed  By: Amie Portland M.D.  On: 11/13/2013 09:44  Echo: 11/13/13:Study Conclusions  - Left ventricle: There is global hypokinesis with akinesis of the entire anterior wall and paradoxical septal motion (LBBB). The cavity size was moderately dilated. There was mild concentric hypertrophy. Systolic function was severely reduced. The estimated ejection fraction was in the range of 25% to 30%. Wall motion was normal; there were no regional wall motion abnormalities. Doppler parameters are  consistent with a restrictive pattern, indicative of decreased left ventricular diastolic compliance and/or increased left atrial pressure (grade 3 diastolic dysfunction). Doppler parameters are consistent with elevated ventricular end-diastolic filling pressure. - Ventricular septum: Septal motion showed paradox. - Aortic valve: Trileaflet; normal thickness leaflets. - Aortic root: The aortic root was normal in size. - Mitral valve: There was mild regurgitation. - Left atrium: The atrium was moderately to severely dilated. - Right ventricle: The cavity size was moderately dilated. Wall thickness was normal. Systolic function was moderately reduced. - Right atrium: The atrium was moderately dilated. - Pulmonary arteries: Systolic pressure was mildly increased. PA peak pressure: 42 mm Hg (S).  Impressions:  - There is significant change when compared to the prior study from 07/13/2012. Left ventricle is now moderately dilately with severely impaired systolic function and restrictive pattern of diastolic dysfunction. Right ventricle is moderately dilated with moderately decreased systolic function. At least mild pulmonary hypertension (IVC not visualized).    PHYSICAL EXAM General: Pale, chronically ill WM  in no acute distress. Jaundiced. Head: Normocephalic, atraumatic, sclera non-icteric, oropharynx is clear Neck: Negative for carotid bruits. JVP 14 cm. No adenopathy Lungs: Decreased BS right base. Breathing is unlabored. Heart: IRRR S1 S2 without murmurs, rubs, or gallops.  Abdomen: Soft, non-tender, mildly disteneded with normoactive bowel sounds.  No rebound/guarding. No obvious abdominal masses. Extremities: 1+ edema to knees bilaterally.  Neuro: Alert and oriented X 3. Moves all extremities spontaneously. Psych:  Responds to questions appropriately with a depressed affect.  ASSESSMENT AND PLAN: 1. Acute on chronic systolic CHF: Ischemic cardiomyopathy.  Biventricular  failure, LV EF 25-30%. Has CRT-D system.  He is still very volume overloaded and has not made much progress with diuresis. Creatinine stable and weight basically unchanged.  Suspect atrial fibrillation is part of the problem with this CHF exacerbation as it is limiting CRT.  - Continue Lasix 80 mg IV every 8 hours - Continue current Coreg.  - D/C losartan due to hypotension.  - PICC placed. Check co-ox today to determine if he needs inotrope therapy. BP may not allow use of milrinone. 2. Afib: Persistent, rate controlled. Not a candidate for anticoagulation at this time due to GI bleeding so not a candidate for DCCV. 3. Severe anemia: Status post transfusions. Dr. Elnoria Howard consulted for GI evaluation. EGD showed no definite bleeding source.  4. S/p AVR with tissue prosthesis. 5. CAD s/p CABG: He is on Plavix currently, hemoglobin stable today.  6. 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  LFTs elevated stably.  7. Hypokalemia: increase potassium to 60 MEQ daily  tomorrow.  Chrystie NoseKenneth C. Azai Gaffin, MD, [facility] Attending Cardiologist [facility]   Chrystie NoseHILTY,Floyd C, MD 11/18/2013 1:10 PM

## 2013-11-18 NOTE — Progress Notes (Signed)
Peripherally Inserted Central Catheter/Midline Placement  The IV Nurse has discussed with the patient and/or persons authorized to consent for the patient, the purpose of this procedure and the potential benefits and risks involved with this procedure.  The benefits include less needle sticks, lab draws from the catheter and patient may be discharged home with the catheter.  Risks include, but not limited to, infection, bleeding, blood clot (thrombus formation), and puncture of an artery; nerve damage and irregular heat beat.  Alternatives to this procedure were also discussed.  Wife gave verbal telephone consent.   PICC/Midline Placement Documentation  PICC Triple Lumen 11/18/13 PICC Right Cephalic 42 cm 0 cm (Active)  Indication for Insertion or Continuance of Line Prolonged intravenous therapies;Poor Vasculature-patient has had multiple peripheral attempts or PIVs lasting less than 24 hours 11/18/2013 12:51 PM  Exposed Catheter (cm) 0 cm 11/18/2013 12:51 PM  Site Assessment Clean;Dry;Intact 11/18/2013 12:51 PM  Lumen #1 Status Flushed;Saline locked;Blood return noted 11/18/2013 12:51 PM  Lumen #2 Status Flushed;Saline locked;Blood return noted 11/18/2013 12:51 PM  Lumen #3 Status Flushed;Saline locked;Blood return noted 11/18/2013 12:51 PM  Dressing Type Transparent 11/18/2013 12:51 PM  Dressing Status Clean;Dry;Intact;Antimicrobial disc in place 11/18/2013 12:51 PM  Line Care Connections checked and tightened 11/18/2013 12:51 PM  Dressing Intervention New dressing 11/18/2013 12:51 PM  Dressing Change Due 11/25/13 11/18/2013 12:51 PM       Elliot Dally 11/18/2013, 12:52 PM

## 2013-11-19 ENCOUNTER — Encounter (HOSPITAL_COMMUNITY): Payer: Self-pay | Admitting: Gastroenterology

## 2013-11-19 LAB — COMPREHENSIVE METABOLIC PANEL
ALT: 98 U/L — ABNORMAL HIGH (ref 0–53)
ANION GAP: 11 (ref 5–15)
AST: 73 U/L — AB (ref 0–37)
Albumin: 2.6 g/dL — ABNORMAL LOW (ref 3.5–5.2)
Alkaline Phosphatase: 191 U/L — ABNORMAL HIGH (ref 39–117)
BUN: 22 mg/dL (ref 6–23)
CO2: 34 mEq/L — ABNORMAL HIGH (ref 19–32)
Calcium: 8.6 mg/dL (ref 8.4–10.5)
Chloride: 96 mEq/L (ref 96–112)
Creatinine, Ser: 1 mg/dL (ref 0.50–1.35)
GFR calc Af Amer: 85 mL/min — ABNORMAL LOW (ref >90)
GFR calc non Af Amer: 74 mL/min — ABNORMAL LOW (ref >90)
Glucose, Bld: 108 mg/dL — ABNORMAL HIGH (ref 70–99)
Potassium: 3.4 mEq/L — ABNORMAL LOW (ref 3.7–5.3)
SODIUM: 141 meq/L (ref 137–147)
TOTAL PROTEIN: 5.7 g/dL — AB (ref 6.0–8.3)
Total Bilirubin: 1.4 mg/dL — ABNORMAL HIGH (ref 0.3–1.2)

## 2013-11-19 LAB — PRO B NATRIURETIC PEPTIDE: PRO B NATRI PEPTIDE: 1586 pg/mL — AB (ref 0–125)

## 2013-11-19 LAB — CARBOXYHEMOGLOBIN
CARBOXYHEMOGLOBIN: 2.1 % — AB (ref 0.5–1.5)
METHEMOGLOBIN: 0.9 % (ref 0.0–1.5)
O2 Saturation: 65.5 %
Total hemoglobin: 9.4 g/dL — ABNORMAL LOW (ref 13.5–18.0)

## 2013-11-19 MED ORDER — DEXTROSE 5 % IV SOLN
10.0000 mg/h | INTRAVENOUS | Status: DC
  Administered 2013-11-19 – 2013-11-21 (×3): 10 mg/h via INTRAVENOUS
  Filled 2013-11-19 (×7): qty 25

## 2013-11-19 MED ORDER — METOLAZONE 2.5 MG PO TABS
2.5000 mg | ORAL_TABLET | Freq: Once | ORAL | Status: AC
  Administered 2013-11-19: 2.5 mg via ORAL
  Filled 2013-11-19: qty 1

## 2013-11-19 NOTE — Progress Notes (Signed)
Pt has on condom catheter due to urinary incontinence. Upon assessment of urine, pt had a small amount of pink tinged urine in the catheter tubing. Pt assessed for further bleeding at perineal site. Pt stated this "happens a lot" and stated he was in no pain. Urine dumped from drainage bag. Upon later assessment, urine was amber with no pink tinge. Will continue to assess and monitor.

## 2013-11-19 NOTE — Progress Notes (Signed)
SUBJECTIVE:  Feels a little better but still complains of abdominal distension  OBJECTIVE:   Vitals:   Filed Vitals:   11/19/13 0021 11/19/13 0429 11/19/13 0627 11/19/13 0719  BP: 106/54 98/42  123/63  Pulse:  72  70  Temp:  98.3 F (36.8 C)  97.8 F (36.6 C)  TempSrc:  Oral  Oral  Resp: 20   14  Height:      Weight:   250 lb 7.1 oz (113.6 kg)   SpO2:  99%  98%   I&O's:   Intake/Output Summary (Last 24 hours) at 11/19/13 1119 Last data filed at 11/19/13 0851  Gross per 24 hour  Intake    260 ml  Output   2475 ml  Net  -2215 ml   TELEMETRY: Reviewed telemetry pt in atrial fibrilaltion:     PHYSICAL EXAM General: Well developed, well nourished, in no acute distress Head: Eyes PERRLA, No xanthomas.   Normal cephalic and atramatic  Lungs:   Crackles at bases bilaterally. Heart:   Irregularly irregular S1 S2 Pulses are 2+ & equal. Abdomen: Bowel sounds are positive, abdomen protruberant Extremities:   2-3+ edema Neuro: Alert and oriented X 3. Psych:  Good affect, responds appropriately   LABS: Basic Metabolic Panel:  Recent Labs  91/47/82 0250 11/19/13 0405  NA 141 141  K 3.4* 3.4*  CL 96 96  CO2 34* 34*  GLUCOSE 179* 108*  BUN 23 22  CREATININE 1.10 1.00  CALCIUM 8.7 8.6   Liver Function Tests:  Recent Labs  11/18/13 0250 11/19/13 0405  AST 98* 73*  ALT 121* 98*  ALKPHOS 221* 191*  BILITOT 1.6* 1.4*  PROT 5.9* 5.7*  ALBUMIN 2.8* 2.6*   No results found for this basename: LIPASE, AMYLASE,  in the last 72 hours CBC:  Recent Labs  11/17/13 0220 11/18/13 0250  WBC 10.8* 9.7  HGB 9.3* 8.9*  HCT 33.5* 32.5*  MCV 73.1* 74.2*  PLT 296 272   Cardiac Enzymes: No results found for this basename: CKTOTAL, CKMB, CKMBINDEX, TROPONINI,  in the last 72 hours BNP: No components found with this basename: POCBNP,  D-Dimer: No results found for this basename: DDIMER,  in the last 72 hours Hemoglobin A1C: No results found for this basename: HGBA1C,   in the last 72 hours Fasting Lipid Panel: No results found for this basename: CHOL, HDL, LDLCALC, TRIG, CHOLHDL, LDLDIRECT,  in the last 72 hours Thyroid Function Tests: No results found for this basename: TSH, T4TOTAL, FREET3, T3FREE, THYROIDAB,  in the last 72 hours Anemia Panel: No results found for this basename: VITAMINB12, FOLATE, FERRITIN, TIBC, IRON, RETICCTPCT,  in the last 72 hours Coag Panel:   Lab Results  Component Value Date   INR 1.33 11/12/2013   INR 1.00 09/12/2012   INR 0.9 08/12/2011    RADIOLOGY: Dg Chest 2 View  11/13/2013   CLINICAL DATA:  COPD exacerbation.  Volume overload.  EXAM: CHEST  2 VIEW  COMPARISON:  09/12/2012  FINDINGS: Since the prior study, opacity has developed in the lower lobe head right lung base. This is consistent with a combination of a small a moderate effusion with atelectasis or infiltrate. There is mild atelectasis at the left lateral lung base. No left pleural effusion.  Cardiac silhouette is borderline enlarged. Changes from cardiac surgery are stable. No mediastinal or hilar masses.  Stable well-positioned left anterior chest wall sequential pacemaker.  IMPRESSION: 1. New, small to moderate, right pleural effusion with associated atelectasis  or infiltrate in the right lower lobe. No convincing pulmonary edema.   Electronically Signed   By: Amie Portlandavid  Ormond M.D.   On: 11/13/2013 09:44   [place] Abdomen Complete  11/16/2013   CLINICAL DATA:  Possible cirrhosis  EXAM: ULTRASOUND ABDOMEN COMPLETE  COMPARISON:  None.  FINDINGS: Gallbladder:  Decompressed with evidence of cholelithiasis. No pericholecystic fluid is noted.  Common bile duct:  Diameter: 6.9 mm.  Liver:  Mild nodularity is noted consistent with early cirrhosis. No focal mass lesion is seen.  IVC:  No abnormality visualized.  Pancreas:  Visualized portion unremarkable.  Spleen:  Size and appearance within normal limits.  Right Kidney:  Length: 11.3 cm. Echogenicity within normal limits. No mass or  hydronephrosis visualized.  Left Kidney:  Length: 11.4 cm. Echogenicity within normal limits. No mass or hydronephrosis visualized.  Abdominal aorta:  No aneurysm visualized.  Other findings:  Mild ascites.  IMPRESSION: Mild cirrhotic change with ascites.  Cholelithiasis in a decompressed gallbladder.   Electronically Signed   By: Alcide CleverMark  Lukens M.D.   On: 11/16/2013 09:52   Dg Chest Port 1 View  11/18/2013   CLINICAL DATA:  Right-sided PICC line placement.  CHF.  EXAM: PORTABLE CHEST - 1 VIEW  COMPARISON:  11/13/2013  FINDINGS: Right PICC line tip lies in the lower SVC. Lungs show improved CHF with minimal interstitial edema remaining. There also is improved aeration of the right lower lung with less prominent right pleural effusion.  IMPRESSION: PICC line in the lower SVC. Improved congestive heart failure and less prominent right pleural effusion.   Electronically Signed   By: Irish LackGlenn  Yamagata M.D.   On: 11/18/2013 13:56   ASSESSMENT AND PLAN:  1. Acute on chronic systolic CHF: Ischemic cardiomyopathy. Biventricular failure, LV EF 25-30%. Has CRT-D system. He is still very volume overloaded and has not made much progress with diuresis. Creatinine stable and weight basically unchanged. Suspect atrial fibrillation is part of the problem with this CHF exacerbation as it is limiting CRT. He is net 6.8L negative but is only put out 1.5L yesterday on a large dosing of IV Lasix.  He is 7 pounds up today.  His CVP is reading 1 but I do not think it is accurate given the extent of LE edema and crackles on exam. - Change to an IV Lasix gtt at 10mg /hr and will give a dose of Metolazone as well.  Will check a BNP - Continue current Coreg.  - Losartan stopped due to hypotension.  - PICC placed. Co-ox 65 so will hold off on inotrope therapy for now, especially in the setting of borderline low BP  2. Afib: Persistent, rate controlled. Not a candidate for anticoagulation at this time due to GI bleeding so not a candidate  for DCCV.  3. Severe anemia: Status post transfusions. Dr. Elnoria HowardHung consulted for GI evaluation. EGD showed no definite bleeding source.  4. S/p AVR with tissue prosthesis.  5. CAD s/p CABG: He is on Plavix currently, hemoglobin stable today.  6. 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 LFTs elevated but trending downward.  7. Hypokalemia: increase potassium to 60 MEQ daily.    Quintella ReichertURNER,Timberly Yott R, MD  11/19/2013  11:19 AM

## 2013-11-20 ENCOUNTER — Encounter (HOSPITAL_COMMUNITY)
Admission: AD | Disposition: A | Discharge status: Hospice | Payer: Self-pay | Source: Ambulatory Visit | Attending: Cardiology

## 2013-11-20 LAB — CBC
HEMATOCRIT: 34.3 % — AB (ref 39.0–52.0)
HEMOGLOBIN: 9.6 g/dL — AB (ref 13.0–17.0)
MCH: 20.6 pg — ABNORMAL LOW (ref 26.0–34.0)
MCHC: 28 g/dL — AB (ref 30.0–36.0)
MCV: 73.8 fL — ABNORMAL LOW (ref 78.0–100.0)
Platelets: 273 10*3/uL (ref 150–400)
RBC: 4.65 MIL/uL (ref 4.22–5.81)
RDW: 28.9 % — ABNORMAL HIGH (ref 11.5–15.5)
WBC: 9.8 10*3/uL (ref 4.0–10.5)

## 2013-11-20 LAB — COMPREHENSIVE METABOLIC PANEL
ALBUMIN: 2.8 g/dL — AB (ref 3.5–5.2)
ALT: 89 U/L — ABNORMAL HIGH (ref 0–53)
AST: 67 U/L — ABNORMAL HIGH (ref 0–37)
Alkaline Phosphatase: 196 U/L — ABNORMAL HIGH (ref 39–117)
Anion gap: 11 (ref 5–15)
BUN: 21 mg/dL (ref 6–23)
CO2: 37 mEq/L — ABNORMAL HIGH (ref 19–32)
Calcium: 9.1 mg/dL (ref 8.4–10.5)
Chloride: 91 mEq/L — ABNORMAL LOW (ref 96–112)
Creatinine, Ser: 1.02 mg/dL (ref 0.50–1.35)
GFR calc Af Amer: 83 mL/min — ABNORMAL LOW (ref >90)
GFR calc non Af Amer: 72 mL/min — ABNORMAL LOW (ref >90)
Glucose, Bld: 142 mg/dL — ABNORMAL HIGH (ref 70–99)
POTASSIUM: 3.3 meq/L — AB (ref 3.7–5.3)
Sodium: 139 mEq/L (ref 137–147)
TOTAL PROTEIN: 6.1 g/dL (ref 6.0–8.3)
Total Bilirubin: 1.5 mg/dL — ABNORMAL HIGH (ref 0.3–1.2)

## 2013-11-20 SURGERY — ECHOCARDIOGRAM, TRANSESOPHAGEAL
Anesthesia: Monitor Anesthesia Care

## 2013-11-20 MED ORDER — POTASSIUM CHLORIDE CRYS ER 20 MEQ PO TBCR
40.0000 meq | EXTENDED_RELEASE_TABLET | Freq: Once | ORAL | Status: AC
  Administered 2013-11-20: 40 meq via ORAL
  Filled 2013-11-20: qty 2

## 2013-11-20 MED ORDER — SPIRONOLACTONE 12.5 MG HALF TABLET
12.5000 mg | ORAL_TABLET | Freq: Every day | ORAL | Status: DC
  Administered 2013-11-20 – 2013-11-25 (×6): 12.5 mg via ORAL
  Filled 2013-11-20 (×6): qty 1

## 2013-11-20 NOTE — Progress Notes (Signed)
Pt transferred to room 3E04 from [facility] via wheelchair. Family at bedside. Pt alert and oriented to self only. Pt oriented to unit and room. Call bell in reach. Will continue to monitor pt closely.  Juliane Lack, RN

## 2013-11-20 NOTE — Progress Notes (Signed)
SUBJECTIVE:  Feeling much better today  OBJECTIVE:   Vitals:   Filed Vitals:   11/20/13 0414 11/20/13 0839 11/20/13 0900 11/20/13 1155  BP: 111/67 113/53 113/53 101/65  Pulse: 70 72 70 70  Temp: 98 F (36.7 C) 97.5 F (36.4 C)  97.7 F (36.5 C)  TempSrc: Axillary Oral  Oral  Resp: 19 15  21   Height:      Weight: 247 lb 9.2 oz (112.3 kg)     SpO2: 98% 90%  95%   I&O's:   Intake/Output Summary (Last 24 hours) at 11/20/13 1204 Last data filed at 11/20/13 1159  Gross per 24 hour  Intake  676.5 ml  Output   5050 ml  Net -4373.5 ml   TELEMETRY: Reviewed telemetry pt in atrial fibrillation with V paced rhythm     PHYSICAL EXAM General: Well developed, well nourished, in no acute distress Head: Eyes PERRLA, No xanthomas.   Normal cephalic and atramatic  Lungs:   Crackles at bases bilaterally Heart:   HRRR S1 S2 Pulses are 2+ & equal. Abdomen: Bowel sounds are positive, abdomen soft and non-tender without masses Extremities: 2+ pitting edema Neuro: Alert and oriented X 3. Psych:  Good affect, responds appropriately   LABS: Basic Metabolic Panel:  Recent Labs  99/35/70 0405 11/20/13 0442  NA 141 139  K 3.4* 3.3*  CL 96 91*  CO2 34* 37*  GLUCOSE 108* 142*  BUN 22 21  CREATININE 1.00 1.02  CALCIUM 8.6 9.1   Liver Function Tests:  Recent Labs  11/19/13 0405 11/20/13 0442  AST 73* 67*  ALT 98* 89*  ALKPHOS 191* 196*  BILITOT 1.4* 1.5*  PROT 5.7* 6.1  ALBUMIN 2.6* 2.8*   No results found for this basename: LIPASE, AMYLASE,  in the last 72 hours CBC:  Recent Labs  11/18/13 0250 11/20/13 0442  WBC 9.7 9.8  HGB 8.9* 9.6*  HCT 32.5* 34.3*  MCV 74.2* 73.8*  PLT 272 273   Cardiac Enzymes: No results found for this basename: CKTOTAL, CKMB, CKMBINDEX, TROPONINI,  in the last 72 hours BNP: No components found with this basename: POCBNP,  D-Dimer: No results found for this basename: DDIMER,  in the last 72 hours Hemoglobin A1C: No results found for  this basename: HGBA1C,  in the last 72 hours Fasting Lipid Panel: No results found for this basename: CHOL, HDL, LDLCALC, TRIG, CHOLHDL, LDLDIRECT,  in the last 72 hours Thyroid Function Tests: No results found for this basename: TSH, T4TOTAL, FREET3, T3FREE, THYROIDAB,  in the last 72 hours Anemia Panel: No results found for this basename: VITAMINB12, FOLATE, FERRITIN, TIBC, IRON, RETICCTPCT,  in the last 72 hours Coag Panel:   Lab Results  Component Value Date   INR 1.33 11/12/2013   INR 1.00 09/12/2012   INR 0.9 08/12/2011    RADIOLOGY: Dg Chest 2 View  11/13/2013   CLINICAL DATA:  COPD exacerbation.  Volume overload.  EXAM: CHEST  2 VIEW  COMPARISON:  09/12/2012  FINDINGS: Since the prior study, opacity has developed in the lower lobe head right lung base. This is consistent with a combination of a small a moderate effusion with atelectasis or infiltrate. There is mild atelectasis at the left lateral lung base. No left pleural effusion.  Cardiac silhouette is borderline enlarged. Changes from cardiac surgery are stable. No mediastinal or hilar masses.  Stable well-positioned left anterior chest wall sequential pacemaker.  IMPRESSION: 1. New, small to moderate, right pleural effusion with associated atelectasis  or infiltrate in the right lower lobe. No convincing pulmonary edema.   Electronically Signed   By: Amie Portlandavid  Ormond M.D.   On: 11/13/2013 09:44   [place] Abdomen Complete  11/16/2013   CLINICAL DATA:  Possible cirrhosis  EXAM: ULTRASOUND ABDOMEN COMPLETE  COMPARISON:  None.  FINDINGS: Gallbladder:  Decompressed with evidence of cholelithiasis. No pericholecystic fluid is noted.  Common bile duct:  Diameter: 6.9 mm.  Liver:  Mild nodularity is noted consistent with early cirrhosis. No focal mass lesion is seen.  IVC:  No abnormality visualized.  Pancreas:  Visualized portion unremarkable.  Spleen:  Size and appearance within normal limits.  Right Kidney:  Length: 11.3 cm. Echogenicity within normal  limits. No mass or hydronephrosis visualized.  Left Kidney:  Length: 11.4 cm. Echogenicity within normal limits. No mass or hydronephrosis visualized.  Abdominal aorta:  No aneurysm visualized.  Other findings:  Mild ascites.  IMPRESSION: Mild cirrhotic change with ascites.  Cholelithiasis in a decompressed gallbladder.   Electronically Signed   By: Alcide CleverMark  Lukens M.D.   On: 11/16/2013 09:52   Dg Chest Port 1 View  11/18/2013   CLINICAL DATA:  Right-sided PICC line placement.  CHF.  EXAM: PORTABLE CHEST - 1 VIEW  COMPARISON:  11/13/2013  FINDINGS: Right PICC line tip lies in the lower SVC. Lungs show improved CHF with minimal interstitial edema remaining. There also is improved aeration of the right lower lung with less prominent right pleural effusion.  IMPRESSION: PICC line in the lower SVC. Improved congestive heart failure and less prominent right pleural effusion.   Electronically Signed   By: Irish LackGlenn  Yamagata M.D.   On: 11/18/2013 13:56   ASSESSMENT AND PLAN:  1. Acute on chronic systolic CHF: Ischemic cardiomyopathy. Biventricular failure, LV EF 25-30%. Has CRT-D system.  Suspect atrial fibrillation is part of the problem with this CHF exacerbation as it is limiting CRT but by Resident note on 8/3 his ICD showed  increase in CHF for about 4 weeks prior to admit while afib had started only 7 days prior to admission. He is net 11L negative and diuresed 3.4 L on Lasix gtt yesterday.  He is 3 pounds down today from yesterday.     - Continue on IV Lasix gtt for an additional day - Continue current Coreg.  - Losartan stopped due to hypotension.  - add aldactone 12.5mg  daily - PICC placed. Co-ox 65 so will hold off on inotrope therapy for now, especially in the setting of borderline low BP  2. Afib: Persistent, rate controlled. Not a candidate for anticoagulation at this time due to GI bleeding so not a candidate for DCCV. Will discuss with GI whether patient can go back on anticoagulation 3. Severe anemia:  Status post transfusions. Dr. Elnoria HowardHung consulted for GI evaluation. EGD showed no definite bleeding source. 4. S/p AVR with tissue prosthesis.  5. CAD s/p CABG: He is on Plavix currently, hemoglobin stable today.  6. 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 LFTs elevated but trending downward.  7. Hypokalemia: replete and increase daily potassium to 40meq BID   He is hemodynamically stable so will transfer to 4700.   Quintella ReichertURNER,Yolette Hastings R, MD  11/20/2013  12:04 PM

## 2013-11-20 NOTE — Progress Notes (Signed)
Pt is on lasix IV for diuresis. Pt and family requested a foley be placed due to pt's urinary incontinence and multiple unsuccessful attempts at using a condom catheter. Pt educated on risk of infection with a placement of a foley. MD on call was called and gave order for placement of foley; MD did say he did not want foley to be in for a very long time. A foley huddle was held with another nurse and the charge nurse. It was decided to bladder scan the pt, do an In and Out catheter, and try to place a condom catheter once more. 580 ml of urine was scanned. In and out cath done and only came out. Condom catheter placed.

## 2013-11-20 NOTE — Progress Notes (Signed)
Pt found standing up by side of bed. Pt stated he got up because he had peed on himself. Condom catheter was found to be off of the pt and urine was on the bed, pt's gown, and floor. Pt given a bath and cleaned up. Foley catheter was then placed to avoid fall risks.

## 2013-11-21 ENCOUNTER — Inpatient Hospital Stay (HOSPITAL_COMMUNITY): Payer: Medicare HMO

## 2013-11-21 DIAGNOSIS — E876 Hypokalemia: Secondary | ICD-10-CM | POA: Diagnosis not present

## 2013-11-21 LAB — COMPREHENSIVE METABOLIC PANEL WITH GFR
ALT: 77 U/L — ABNORMAL HIGH (ref 0–53)
AST: 59 U/L — ABNORMAL HIGH (ref 0–37)
Albumin: 3 g/dL — ABNORMAL LOW (ref 3.5–5.2)
Alkaline Phosphatase: 196 U/L — ABNORMAL HIGH (ref 39–117)
Anion gap: 15 (ref 5–15)
BUN: 20 mg/dL (ref 6–23)
CO2: 39 meq/L — ABNORMAL HIGH (ref 19–32)
Calcium: 9.5 mg/dL (ref 8.4–10.5)
Chloride: 85 meq/L — ABNORMAL LOW (ref 96–112)
Creatinine, Ser: 1 mg/dL (ref 0.50–1.35)
GFR calc Af Amer: 85 mL/min — ABNORMAL LOW
GFR calc non Af Amer: 74 mL/min — ABNORMAL LOW
Glucose, Bld: 129 mg/dL — ABNORMAL HIGH (ref 70–99)
Potassium: 3.2 meq/L — ABNORMAL LOW (ref 3.7–5.3)
Sodium: 139 meq/L (ref 137–147)
Total Bilirubin: 1.7 mg/dL — ABNORMAL HIGH (ref 0.3–1.2)
Total Protein: 6.5 g/dL (ref 6.0–8.3)

## 2013-11-21 LAB — CARBOXYHEMOGLOBIN
CARBOXYHEMOGLOBIN: 2.3 % — AB (ref 0.5–1.5)
Carboxyhemoglobin: 2.3 % — ABNORMAL HIGH (ref 0.5–1.5)
METHEMOGLOBIN: 1 % (ref 0.0–1.5)
Methemoglobin: 0.8 % (ref 0.0–1.5)
O2 Saturation: 51 %
O2 Saturation: 55.3 %
Total hemoglobin: 10.5 g/dL — ABNORMAL LOW (ref 13.5–18.0)
Total hemoglobin: 11.7 g/dL — ABNORMAL LOW (ref 13.5–18.0)

## 2013-11-21 MED ORDER — HYDRALAZINE HCL 10 MG PO TABS
10.0000 mg | ORAL_TABLET | Freq: Three times a day (TID) | ORAL | Status: DC
  Administered 2013-11-21 – 2013-11-25 (×9): 10 mg via ORAL
  Filled 2013-11-21 (×16): qty 1

## 2013-11-21 MED ORDER — POTASSIUM CHLORIDE CRYS ER 20 MEQ PO TBCR
40.0000 meq | EXTENDED_RELEASE_TABLET | Freq: Two times a day (BID) | ORAL | Status: DC
  Administered 2013-11-21 – 2013-11-24 (×7): 40 meq via ORAL
  Filled 2013-11-21 (×8): qty 2

## 2013-11-21 MED ORDER — PANTOPRAZOLE SODIUM 40 MG PO TBEC
40.0000 mg | DELAYED_RELEASE_TABLET | Freq: Two times a day (BID) | ORAL | Status: DC
  Administered 2013-11-21 – 2013-11-25 (×8): 40 mg via ORAL
  Filled 2013-11-21 (×8): qty 1

## 2013-11-21 MED ORDER — POTASSIUM CHLORIDE 10 MEQ/100ML IV SOLN
10.0000 meq | INTRAVENOUS | Status: AC
Start: 2013-11-21 — End: 2013-11-21
  Administered 2013-11-21 (×2): 10 meq via INTRAVENOUS
  Filled 2013-11-21 (×2): qty 100

## 2013-11-21 NOTE — Progress Notes (Signed)
Patient resting in bed with no complaints at this time. Patient states "I feel better." When asked if breathing is any better patients states "yes." Will continue to monitor patient.

## 2013-11-21 NOTE — Progress Notes (Signed)
IV team made aware of 1600 Carboxyhemoglobi lab draw since patient has triple lumen PICC.

## 2013-11-21 NOTE — Progress Notes (Signed)
Dr. Rennis Golden made aware of patient condition. MD to make rounds. Theodore Demark, PA in room at this time assessing patient. Will continue to monitor.

## 2013-11-21 NOTE — Progress Notes (Signed)
Patient c/o "not being able to breath" and "suffocating." Patient states he feels like he is not getting enough air. Patient saturations 96% on room air. 2L nasal cannula placed and patient states he feels relief and now can breath. Will notify MD and will continue to monitor.

## 2013-11-21 NOTE — Progress Notes (Signed)
PT Cancellation Note  Patient Details Name: [name] MRN: [id number] DOB: 02-25-1941   Cancelled Treatment:    Reason Eval/Treat Not Completed: Medical issues which prohibited therapy. Per nursing, MD ordered stat labs and pt with difficulty breathing at this time. Will continue to follow and eval when appropriate.    Ruthann Cancer 11/21/2013, 3:47 PM  Ruthann Cancer, PT, DPT Acute Rehabilitation Services Pager: [phone]

## 2013-11-21 NOTE — Progress Notes (Signed)
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 Lasix gtt started 08/10.   SUBJECTIVE: Pt breathing worse today than yesterday, started when he was eating. Felt like food was "stuck" but did not go down the wrong way. SOB improved with 2lpm O2, but still feels worse than yesterday. No chest pain, ?pressure sensation.   OBJECTIVE Filed Vitals:   11/20/13 2027 11/21/13 0533 11/21/13 0804 11/21/13 0854  BP: 122/72 122/57 123/82 123/69  Pulse: 74 70 81 71  Temp: 97.6 F (36.4 C) 97.6 F (36.4 C)    TempSrc: Oral Oral    Resp: 20 20  20   Height:      Weight:  228 lb 9.9 oz (103.7 kg)    SpO2: 94% 94% 96% 100%    Intake/Output Summary (Last 24 hours) at 11/21/13 0930 Last data filed at 11/21/13 1610  Gross per 24 hour  Intake    100 ml  Output   5751 ml  Net  -5651 ml   Filed Weights   11/20/13 0414 11/20/13 1432 11/21/13 0533  Weight: 247 lb 9.2 oz (112.3 kg) 237 lb 4.8 oz (107.639 kg) 228 lb 9.9 oz (103.7 kg)    PHYSICAL EXAM General: Well developed, well nourished, male in moderate respiratory distress. Head: Normocephalic, atraumatic.  Neck: Supple without bruits, JVP elevated Lungs:  Resp regular and unlabored, decreased BS bases w/ rales. Heart: RRR, S1, S2, no S3, S4, + murmur; no rub. Abdomen: Soft, non-tender, non-distended, BS + x 4. +hepatic enlargement, + H-J  reflux Extremities: No clubbing, cyanosis, 2+ LE edema.  Neuro: Alert and oriented X 3. Moves all extremities spontaneously.  LABS: CBC:  Recent Labs  11/20/13 0442  WBC 9.8  HGB 9.6*  HCT 34.3*  MCV 73.8*  PLT 273   Basic Metabolic Panel:  Recent Labs  96/04/54 0442 11/21/13 0545  NA 139 139  K 3.3* 3.2*  CL 91* 85*  CO2 37* 39*  GLUCOSE 142* 129*  BUN 21 20  CREATININE 1.02 1.00  CALCIUM 9.1 9.5   Liver Function Tests:  Recent Labs  11/20/13 0442 11/21/13 0545  AST 67* 59*  ALT 89* 77*  ALKPHOS 196* 196*  BILITOT 1.5* 1.7*  PROT 6.1 6.5  ALBUMIN 2.8* 3.0*   BNP: Pro B Natriuretic peptide (BNP)  Date/Time Value Ref Range Status  11/19/2013  1:55 PM 1586.0* 0 - 125 pg/mL Final  11/12/2013  6:20 PM 3747.0* 0 - 125 pg/mL Final    TELE:   Atrial fib, with/without BiV pacing, rate generally OK     ECHO: 08/04  Study Conclusions - Left ventricle: There is global hypokinesis with akinesis of the entire anterior wall and paradoxical septal motion (LBBB). The cavity size was moderately dilated. There was mild concentric hypertrophy. Systolic function was severely reduced. The estimated ejection fraction was in the range of 25% to 30%.  Wall motion was normal; there were no regional wall motion abnormalities. Doppler parameters are consistent with a restrictive pattern, indicative of decreased left ventricular diastolic compliance and/or increased left atrial pressure (grade 3 diastolic dysfunction). Doppler parameters are consistent with elevated ventricular end-diastolic filling pressure. - Ventricular septum: Septal motion showed paradox. - Aortic valve: Trileaflet; normal thickness leaflets. - Aortic root: The aortic root was normal in size. - Mitral valve: There was mild regurgitation. - Left atrium: The atrium was moderately to severely dilated. - Right ventricle: The cavity size was moderately dilated. Wall thickness was normal. Systolic function was  moderately reduced. - Right atrium: The atrium was moderately dilated. - Pulmonary arteries: Systolic pressure was mildly increased. PA peak pressure: 42 mm Hg (S). Impressions: - There is significant change when compared to the prior study from 07/13/2012. Left ventricle is now moderately dilately with severely impaired systolic function and restrictive pattern of diastolic dysfunction. Right ventricle is moderately dilated with moderately decreased systolic function. At least mild pulmonary hypertension (IVC not visualized).  Radiology/Studies: US Abdomen Complete 11/16/2013   CLINICAL DATA:  Possible cirrhosis  EXAM: ULTRASOUND ABDOMEN COMPLETE  COMPARISON:  None.  FINDINGS: Gallbladder:  Decompressed with evidence of cholelithiasis. No pericholecystic fluid is noted.  Common bile duct:  Diameter: 6.9 mm.  Liver:  Mild nodularity is noted consistent with early cirrhosis. No focal mass lesion is seen.  IVC:  No abnormality visualized.  Pancreas:  Visualized portion unremarkable.  Spleen:  Size and appearance within normal limits.  Right Kidney:  Length: 11.3 cm. Echogenicity within normal limits. No mass or hydronephrosis visualized.  Left Kidney:  Length: 11.4 cm. Echogenicity within normal limits. No mass or hydronephrosis visualized.  Abdominal aorta:  No aneurysm visualized.  Other findings:  Mild ascites.  IMPRESSION: Mild cirrhotic change with ascites.  Cholelithiasis in a decompressed gallbladder.   Electronically Signed   By: Alcide Clever M.D.   On: 11/16/2013 09:52   Dg Chest Port 1 View 11/18/2013   CLINICAL DATA:  Right-sided PICC line placement.  CHF.  EXAM: PORTABLE CHEST - 1 VIEW  COMPARISON:  11/13/2013  FINDINGS: Right PICC line tip lies in the lower SVC. Lungs show improved CHF with minimal interstitial edema remaining. There also is improved aeration of the right lower lung with less prominent right pleural effusion.  IMPRESSION: PICC line in the lower SVC. Improved congestive heart failure  and less prominent right pleural effusion.   Electronically Signed   By: Irish Lack M.D.   On: 11/18/2013 13:56     Current Medications:  . antiseptic oral rinse  7 mL Mouth Rinse BID  . carvedilol  6.25 mg Oral BID WC  . clopidogrel  75 mg Oral Daily  . ferrous sulfate  325 mg Oral BID WC  . folic acid  1 mg Oral Daily  . multivitamin with minerals  1 tablet Oral Daily  . pantoprazole  40 mg Oral BID AC  . potassium chloride  60 mEq Oral Daily  . sodium chloride  10-40 mL Intracatheter Q12H  . spironolactone  12.5 mg Oral Daily  . thiamine  100 mg Oral Daily   . furosemide (LASIX) infusion 10 mg/hr (11/20/13 1616)    ASSESSMENT AND PLAN: ASSESSMENT AND PLAN:  1. Acute on chronic systolic CHF: Ischemic cardiomyopathy. Biventricular failure, LV EF 25-30%. Has CRT-D system. Suspect atrial fibrillation is part of the problem with this CHF exacerbation as it is limiting CRT but by Resident note on 8/3 his  ICD showed increase in CHF for about 4 weeks prior to admit while afib had started only 7 days prior to admission. He is net 15L negative and diuresing well on Lasix gtt. Weight down 18 pounds down from admit.  - Continue on IV Lasix gtt   - Recheck CXR  - Continue current Coreg.  - Losartan stopped due to hypotension.  - aldactone 12.5mg  daily added 08/11 - PICC placed. Co-ox pending today; review and decide on inotrope therapy +/- hydralazine/nitrates  -  borderline low BP limits options.  2. Afib: Persistent, rate controlled. Not a candidate for anticoagulation at this time due to GI bleeding so not a candidate for DCCV. Will discuss with GI whether patient can go back on anticoagulation   3. Severe anemia: Status post transfusions. Dr. Elnoria HowardHung consulted for GI evaluation. EGD showed no definite bleeding source.   4. S/p AVR with tissue prosthesis.  - see echo report  5. CAD s/p CABG: He is on Plavix currently, hemoglobin stable today. Chest pressure this am, had SOB first,  follow. Cycle enzymes per MD.  6. 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 LFTs elevated but trending downward.   7. Hypokalemia: repleted 100 meq 08/11, K+ 3.3-->3.2, give 2 runs IV and increase daily potassium to 40meq BID   SignedTheodore Demark, Jasiri Hanawalt , PA-C 9:30 AM 11/21/2013

## 2013-11-21 NOTE — Progress Notes (Signed)
Pt. Seen and examined. Agree with the NP/PA-C note as written.  Breathing is worse today. Co-ox is down to 51. He is out -15L, including 5L negative yesterday. Suspect symptoms due to low cardiac output. Will ask Advanced CHF service to see - diuresing well. There appears to be BP room for afterload reduction. Will add hydralazine 10 mg TID today. Re-check Co-ox later today.  Chrystie Nose, MD, [facility] Attending Cardiologist [facility]

## 2013-11-22 LAB — COMPREHENSIVE METABOLIC PANEL
ALT: 89 U/L — ABNORMAL HIGH (ref 0–53)
AST: 81 U/L — AB (ref 0–37)
Albumin: 3.1 g/dL — ABNORMAL LOW (ref 3.5–5.2)
Alkaline Phosphatase: 222 U/L — ABNORMAL HIGH (ref 39–117)
Anion gap: 11 (ref 5–15)
BILIRUBIN TOTAL: 1.5 mg/dL — AB (ref 0.3–1.2)
BUN: 23 mg/dL (ref 6–23)
CHLORIDE: 89 meq/L — AB (ref 96–112)
CO2: 37 mEq/L — ABNORMAL HIGH (ref 19–32)
Calcium: 9.4 mg/dL (ref 8.4–10.5)
Creatinine, Ser: 1.13 mg/dL (ref 0.50–1.35)
GFR calc Af Amer: 74 mL/min — ABNORMAL LOW (ref >90)
GFR calc non Af Amer: 64 mL/min — ABNORMAL LOW (ref >90)
Glucose, Bld: 139 mg/dL — ABNORMAL HIGH (ref 70–99)
Potassium: 3.8 mEq/L (ref 3.7–5.3)
SODIUM: 137 meq/L (ref 137–147)
Total Protein: 6.7 g/dL (ref 6.0–8.3)

## 2013-11-22 MED ORDER — FUROSEMIDE 80 MG PO TABS
80.0000 mg | ORAL_TABLET | Freq: Every day | ORAL | Status: DC
  Administered 2013-11-22 – 2013-11-23 (×2): 80 mg via ORAL
  Filled 2013-11-22 (×2): qty 1

## 2013-11-22 MED ORDER — ACETAMINOPHEN 325 MG PO TABS
650.0000 mg | ORAL_TABLET | Freq: Four times a day (QID) | ORAL | Status: DC | PRN
  Administered 2013-11-22 (×2): 650 mg via ORAL
  Filled 2013-11-22 (×2): qty 2

## 2013-11-22 MED ORDER — LOSARTAN POTASSIUM 25 MG PO TABS
25.0000 mg | ORAL_TABLET | Freq: Every day | ORAL | Status: DC
  Administered 2013-11-22 – 2013-11-25 (×4): 25 mg via ORAL
  Filled 2013-11-22 (×4): qty 1

## 2013-11-22 NOTE — Progress Notes (Addendum)
DAILY PROGRESS NOTE  Subjective:  Feels much better today. Net negative another 2.8L yesterday, total of 17L negative. Coox was decreased to 51 and is climbing slowly up to 55. Hydralazine added yesterday for afterload reduction. Creatinine is starting to slowly rise. BNP yesterday was 1586. BP stable. Weight is down to 226 lb (was 248 in the office recently) - not clear what his dry weight is. He was only on lasix 20 mg daily at home.  Objective:  Temp:  [97.9 F (36.6 C)-98.5 F (36.9 C)] 97.9 F (36.6 C) (08/13 2703) Pulse Rate:  [69-74] 72 (08/13 0632) Resp:  [18-20] 20 (08/13 5009) BP: (111-123)/(58-69) 114/58 mmHg (08/13 0632) SpO2:  [100 %] 100 % (08/13 3818) Weight:  [226 lb 13.7 oz (102.9 kg)] 226 lb 13.7 oz (102.9 kg) (08/13 2993) Weight change: -10 lb 7.2 oz (-4.739 kg)  Intake/Output from previous day: 08/12 0701 - 08/13 0700 In: 440 [P.O.:240; IV Piggyback:200] Out: 3300 [Urine:3300]  Intake/Output from this shift:    Medications: Current Facility-Administered Medications  Medication Dose Route Frequency Provider Last Rate Last Dose  . acetaminophen (TYLENOL) tablet 650 mg  650 mg Oral Q6H PRN Darlin Coco, MD   650 mg at 11/22/13 0051  . albuterol (PROVENTIL) (2.5 MG/3ML) 0.083% nebulizer solution 3 mL  3 mL Inhalation Q4H PRN Mihai Croitoru, MD      . antiseptic oral rinse (CPC / CETYLPYRIDINIUM CHLORIDE 0.05%) solution 7 mL  7 mL Mouth Rinse BID Darlin Coco, MD   7 mL at 11/21/13 2200  . carvedilol (COREG) tablet 6.25 mg  6.25 mg Oral BID WC Mihai Croitoru, MD   6.25 mg at 11/22/13 0824  . clopidogrel (PLAVIX) tablet 75 mg  75 mg Oral Daily Mihai Croitoru, MD   75 mg at 11/21/13 1015  . ferrous sulfate tablet 325 mg  325 mg Oral BID WC Mihai Croitoru, MD   325 mg at 11/22/13 0824  . folic acid (FOLVITE) tablet 1 mg  1 mg Oral Daily Mihai Croitoru, MD   1 mg at 11/21/13 1016  . furosemide (LASIX) 250 mg in dextrose 5 % 250 mL (1 mg/mL) infusion  10  mg/hr Intravenous Continuous Sueanne Margarita, MD 10 mL/hr at 11/21/13 1954 10 mg/hr at 11/21/13 1954  . hydrALAZINE (APRESOLINE) tablet 10 mg  10 mg Oral 3 times per day Pixie Casino, MD   10 mg at 11/21/13 2118  . multivitamin with minerals tablet 1 tablet  1 tablet Oral Daily Lorretta Harp, MD   1 tablet at 11/21/13 1016  . pantoprazole (PROTONIX) EC tablet 40 mg  40 mg Oral BID AC Darlin Coco, MD   40 mg at 11/21/13 1612  . potassium chloride SA (K-DUR,KLOR-CON) CR tablet 40 mEq  40 mEq Oral BID Evelene Croon Barrett, PA-C   40 mEq at 11/21/13 2118  . sodium chloride 0.9 % injection 10-40 mL  10-40 mL Intracatheter Q12H Darlin Coco, MD   10 mL at 11/20/13 0903  . sodium chloride 0.9 % injection 10-40 mL  10-40 mL Intracatheter PRN Darlin Coco, MD      . spironolactone (ALDACTONE) tablet 12.5 mg  12.5 mg Oral Daily Sueanne Margarita, MD   12.5 mg at 11/21/13 1015  . thiamine (VITAMIN B-1) tablet 100 mg  100 mg Oral Daily Lorretta Harp, MD   100 mg at 11/21/13 1016    Physical Exam: General appearance: alert, no distress and somewhat irritated Lungs: diminished breath  sounds bibasilar Heart: regular rate and rhythm, S1, S2 normal, no S3 or S4 and systolic murmur: early systolic 2/6, blowing at apex Extremities: edema trace to 1+ Pulses: 2+ and symmetric  Lab Results: Results for orders placed during the hospital encounter of 11/12/13 (from the past 48 hour(s))  COMPREHENSIVE METABOLIC PANEL     Status: Abnormal   Collection Time    11/21/13  5:45 AM      Result Value Ref Range   Sodium 139  137 - 147 mEq/L   Potassium 3.2 (*) 3.7 - 5.3 mEq/L   Chloride 85 (*) 96 - 112 mEq/L   CO2 39 (*) 19 - 32 mEq/L   Glucose, Bld 129 (*) 70 - 99 mg/dL   BUN 20  6 - 23 mg/dL   Creatinine, Ser 1.00  0.50 - 1.35 mg/dL   Calcium 9.5  8.4 - 10.5 mg/dL   Total Protein 6.5  6.0 - 8.3 g/dL   Albumin 3.0 (*) 3.5 - 5.2 g/dL   AST 59 (*) 0 - 37 U/L   ALT 77 (*) 0 - 53 U/L   Alkaline  Phosphatase 196 (*) 39 - 117 U/L   Total Bilirubin 1.7 (*) 0.3 - 1.2 mg/dL   GFR calc non Af Amer 74 (*) >90 mL/min   GFR calc Af Amer 85 (*) >90 mL/min   Comment: (NOTE)     The eGFR has been calculated using the CKD EPI equation.     This calculation has not been validated in all clinical situations.     eGFR's persistently <90 mL/min signify possible Chronic Kidney     Disease.   Anion gap 15  5 - 15  CARBOXYHEMOGLOBIN     Status: Abnormal   Collection Time    11/21/13 10:30 AM      Result Value Ref Range   Total hemoglobin 10.5 (*) 13.5 - 18.0 g/dL   O2 Saturation 51.0     Carboxyhemoglobin 2.3 (*) 0.5 - 1.5 %   Methemoglobin 0.8  0.0 - 1.5 %  CARBOXYHEMOGLOBIN     Status: Abnormal   Collection Time    11/21/13  4:15 PM      Result Value Ref Range   Total hemoglobin 11.7 (*) 13.5 - 18.0 g/dL   O2 Saturation 55.3     Carboxyhemoglobin 2.3 (*) 0.5 - 1.5 %   Methemoglobin 1.0  0.0 - 1.5 %  COMPREHENSIVE METABOLIC PANEL     Status: Abnormal   Collection Time    11/22/13  5:12 AM      Result Value Ref Range   Sodium 137  137 - 147 mEq/L   Potassium 3.8  3.7 - 5.3 mEq/L   Chloride 89 (*) 96 - 112 mEq/L   CO2 37 (*) 19 - 32 mEq/L   Glucose, Bld 139 (*) 70 - 99 mg/dL   BUN 23  6 - 23 mg/dL   Creatinine, Ser 1.13  0.50 - 1.35 mg/dL   Calcium 9.4  8.4 - 10.5 mg/dL   Total Protein 6.7  6.0 - 8.3 g/dL   Albumin 3.1 (*) 3.5 - 5.2 g/dL   AST 81 (*) 0 - 37 U/L   ALT 89 (*) 0 - 53 U/L   Alkaline Phosphatase 222 (*) 39 - 117 U/L   Total Bilirubin 1.5 (*) 0.3 - 1.2 mg/dL   GFR calc non Af Amer 64 (*) >90 mL/min   GFR calc Af Amer 74 (*) >90 mL/min  Comment: (NOTE)     The eGFR has been calculated using the CKD EPI equation.     This calculation has not been validated in all clinical situations.     eGFR's persistently <90 mL/min signify possible Chronic Kidney     Disease.   Anion gap 11  5 - 15    Imaging: Dg Chest Port 1 View  11/21/2013   CLINICAL DATA:  Shortness of  breath; history of CHF  EXAM: PORTABLE CHEST - 1 VIEW  COMPARISON:  Portable chest x-ray of November 18, 2013  FINDINGS: The lungs are better inflated today. The interstitial markings remain mildly increased especially in the lower lung zones. The cardiopericardial silhouette remains enlarged. The pulmonary vascularity is less engorged. A small right pleural effusion persists. The patient has undergone previous CABG. The permanent pacemaker defibrillator is in appropriate position.  IMPRESSION: The findings are consistent with mild CHF. There has been improvement since the previous study.   Electronically Signed   By: David  Martinique   On: 11/21/2013 10:11    Assessment:  1. Principal Problem: 2.   Acute on chronic systolic and diastolic heart failure, NYHA class 4 3. Active Problems: 4.   CAD (coronary artery disease) 5.   LBBB (left bundle branch block) 6.   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   Aortic valve replaced 8.   Other encephalopathy 9.   Persistent atrial fibrillation 10.   Jaundice 11.   Acute blood loss anemia 12.   Hypokalemia 13.   Plan:  1. Will transition off IV lasix to po lasix 80 mg daily today. Follow Co-Ox. Would add back low dose ARB (he has history of cough on ACE-I)  - may transition to [facility] in the CHF clinic.  Possible d/c tomorrow if stable with early CHF clinic follow-up.  Time Spent Directly with Patient:  15 minutes  Length of Stay:  LOS: 10 days   Pixie Casino, MD, [facility] Attending Cardiologist [facility]  Jakerria Kingbird,Alquan C 11/22/2013, 8:48 AM

## 2013-11-22 NOTE — Evaluation (Signed)
Physical Therapy Evaluation Patient Details Name: TERRIK SCARCE MRN: [id number] DOB: Feb 20, 1941 Today's Date: 11/22/2013   History of Present Illness  Pt is a 73 y/o male admitted from a follow-up appointment, with complaints of LE edema, chest pain, and yellowing of his skin (jaundice appearance). Pt admitted with CHF exacerbation and chronic anemia.   Clinical Impression  Pt admitted with the above. Pt currently with functional limitations due to the deficits listed below (see PT Problem List). At the time of PT eval pt was able to perform some limited ambulation due to lightheadedness. O2 sats remained >90% on RA throughout session. Pt will benefit from skilled PT to increase their independence and safety with mobility to allow discharge to the venue listed below.      Follow Up Recommendations Home health PT;Supervision for mobility/OOB    Equipment Recommendations  Cane    Recommendations for Other Services       Precautions / Restrictions Precautions Precautions: Fall Restrictions Weight Bearing Restrictions: No      Mobility  Bed Mobility Overal bed mobility: Needs Assistance Bed Mobility: Supine to Sit     Supine to sit: Min guard     General bed mobility comments: VC's for sequencing and technique. Was able to transition to EOB with min guard for trunk elevation.   Transfers Overall transfer level: Needs assistance Equipment used: None   Sit to Stand: Min guard         General transfer comment: Pt able to power-up to full standing with steadying assist only. Pt cued to stand for a moment and regain balance before initiating ambulation. VC's for hand placement on seated surface for safety.   Ambulation/Gait Ambulation/Gait assistance: Min guard Ambulation Distance (Feet): 75 Feet Assistive device: None (Pt pushed IV pole) Gait Pattern/deviations: Step-through pattern;Decreased stride length;Narrow base of support;Trunk flexed Gait velocity:  Decreased Gait velocity interpretation: Below normal speed for age/gender General Gait Details: Pt reports feeling lightheaded during ambulation. 2 standing rest breaks because of this. Steadying assist only was required, and pt was directed back to the room as his symptoms were not improving. Upon return to the room O2 sats were at 91% on RA. Dynamap was not readily available to check BP, however pt states an immediate improvement upon seated rest break.   Stairs            Wheelchair Mobility    Modified Rankin (Stroke Patients Only)       Balance Overall balance assessment: Needs assistance Sitting-balance support: Feet supported;No upper extremity supported Sitting balance-Leahy Scale: Fair     Standing balance support: Single extremity supported;During functional activity Standing balance-Leahy Scale: Fair                               Pertinent Vitals/Pain Pain Assessment: No/denies pain    Home Living Family/patient expects to be discharged to:: Private residence Living Arrangements: Spouse/significant other Available Help at Discharge: Family;Available 24 hours/day Type of Home: House Home Access: Stairs to enter Entrance Stairs-Rails: Right Entrance Stairs-Number of Steps: 5 Home Layout: One level Home Equipment: Walker - 4 wheels      Prior Function Level of Independence: Independent         Comments: Still driving, still working part time, Tourist information centre manager.     Hand Dominance   Dominant Hand: Right    Extremity/Trunk Assessment   Upper Extremity Assessment: Defer to OT evaluation  Lower Extremity Assessment: Generalized weakness      Cervical / Trunk Assessment: Normal  Communication   Communication: No difficulties  Cognition Arousal/Alertness: Awake/alert Behavior During Therapy: WFL for tasks assessed/performed Overall Cognitive Status: No family/caregiver present to determine baseline cognitive  functioning                      General Comments      Exercises        Assessment/Plan    PT Assessment Patient needs continued PT services  PT Diagnosis Difficulty walking;Generalized weakness   PT Problem List Decreased strength;Decreased range of motion;Decreased activity tolerance;Decreased balance;Decreased mobility;Decreased knowledge of use of DME;Decreased safety awareness;Decreased knowledge of precautions;Pain  PT Treatment Interventions DME instruction;Gait training;Stair training;Functional mobility training;Therapeutic activities;Therapeutic exercise;Neuromuscular re-education;Patient/family education   PT Goals (Current goals can be found in the Care Plan section) Acute Rehab PT Goals Patient Stated Goal: To return home PT Goal Formulation: With patient Time For Goal Achievement: 11/29/13 Potential to Achieve Goals: Good    Frequency Min 3X/week   Barriers to discharge        Co-evaluation               End of Session Equipment Utilized During Treatment: Gait belt;Oxygen Activity Tolerance: Patient limited by fatigue Patient left: in chair;with call bell/phone within reach Nurse Communication: Mobility status         Time: [id number] PT Time Calculation (min): 26 min   Charges:   PT Evaluation $Initial PT Evaluation Tier I: 1 Procedure PT Treatments $Gait Training: 8-22 mins   PT G Codes:          Ruthann CancerHamilton, Rockwell Zentz 11/22/2013, 2:07 PM  Ruthann CancerLaura Hamilton, PT, DPT Acute Rehabilitation Services Pager: [phone]

## 2013-11-22 NOTE — Progress Notes (Signed)
UR completed Zadie Deemer K. Hisako Bugh, RN, BSN, MSHL, CCM  11/22/2013 3:11 PM

## 2013-11-22 NOTE — Progress Notes (Signed)
Pt exited the bed with foley catheter and bag hanging on opposite side of bed. Foley trauma noted with moderate bleeding. Pt in moderate to severe pain at trauma site. RN cleaned and readjusted Foley catheter and securing device. RN attempting to keep Foley in due to need for strict I's & O's while on Lasix drip. Pt also without order for Ativan to accompany ongoing CIWA protocol. On-call MD made aware. Tylenol ordered by MD. Will continue to monitor.

## 2013-11-23 LAB — BASIC METABOLIC PANEL
Anion gap: 14 (ref 5–15)
BUN: 26 mg/dL — ABNORMAL HIGH (ref 6–23)
CALCIUM: 9.5 mg/dL (ref 8.4–10.5)
CO2: 34 meq/L — AB (ref 19–32)
Chloride: 89 mEq/L — ABNORMAL LOW (ref 96–112)
Creatinine, Ser: 1.21 mg/dL (ref 0.50–1.35)
GFR calc Af Amer: 68 mL/min — ABNORMAL LOW (ref >90)
GFR calc non Af Amer: 58 mL/min — ABNORMAL LOW (ref >90)
GLUCOSE: 163 mg/dL — AB (ref 70–99)
Potassium: 4.1 mEq/L (ref 3.7–5.3)
Sodium: 137 mEq/L (ref 137–147)

## 2013-11-23 LAB — PRO B NATRIURETIC PEPTIDE: Pro B Natriuretic peptide (BNP): 1890 pg/mL — ABNORMAL HIGH (ref 0–125)

## 2013-11-23 MED ORDER — FUROSEMIDE 80 MG PO TABS
80.0000 mg | ORAL_TABLET | Freq: Two times a day (BID) | ORAL | Status: DC
  Administered 2013-11-23 – 2013-11-25 (×4): 80 mg via ORAL
  Filled 2013-11-23 (×6): qty 1

## 2013-11-23 NOTE — Progress Notes (Signed)
Pt now on PO Lasix and with a possible discharge on 8/14. Foley D/C'ed at 22:00 on 8/13. Pt voided by 04:00. Will continue to monitor.

## 2013-11-23 NOTE — Progress Notes (Signed)
Family is desiring outpatient hospice services. This has not been discussed before. He would likely meet criteria given his advanced systolic heart failure. Will ask palliative medicine to see and discuss further options with the family.  Chrystie Nose, MD, [facility] Attending Cardiologist [facility]

## 2013-11-23 NOTE — Progress Notes (Signed)
DAILY PROGRESS NOTE  Subjective:  Net negative another 890 ml yesterday, however, weight is up 2 lbs., total of -18.5L negative. ProBNP is going up some and renal function is climbing.   Objective:  Temp:  [97.8 F (36.6 C)-98.5 F (36.9 C)] 98.3 F (36.8 C) (08/14 0408) Pulse Rate:  [68-75] 75 (08/14 0820) Resp:  [18-20] 20 (08/14 0408) BP: (85-107)/(45-62) 104/58 mmHg (08/14 0820) SpO2:  [95 %-100 %] 95 % (08/14 0408) Weight:  [229 lb 4.5 oz (104 kg)] 229 lb 4.5 oz (104 kg) (08/14 0408) Weight change: 2 lb 6.8 oz (1.1 kg)  Intake/Output from previous day: 08/13 0701 - 08/14 0700 In: 660 [P.O.:600; I.V.:60] Out: 1550 [Urine:1550]  Intake/Output from this shift: Total I/O In: -  Out: 120 [Urine:120]  Medications: Current Facility-Administered Medications  Medication Dose Route Frequency Provider Last Rate Last Dose  . acetaminophen (TYLENOL) tablet 650 mg  650 mg Oral Q6H PRN Darlin Coco, MD   650 mg at 11/22/13 1327  . albuterol (PROVENTIL) (2.5 MG/3ML) 0.083% nebulizer solution 3 mL  3 mL Inhalation Q4H PRN Mihai Croitoru, MD      . antiseptic oral rinse (CPC / CETYLPYRIDINIUM CHLORIDE 0.05%) solution 7 mL  7 mL Mouth Rinse BID Darlin Coco, MD   7 mL at 11/23/13 1032  . carvedilol (COREG) tablet 6.25 mg  6.25 mg Oral BID WC Mihai Croitoru, MD   6.25 mg at 11/23/13 0820  . clopidogrel (PLAVIX) tablet 75 mg  75 mg Oral Daily Mihai Croitoru, MD   75 mg at 11/23/13 1032  . ferrous sulfate tablet 325 mg  325 mg Oral BID WC Mihai Croitoru, MD   325 mg at 11/23/13 0820  . folic acid (FOLVITE) tablet 1 mg  1 mg Oral Daily Mihai Croitoru, MD   1 mg at 11/23/13 1032  . furosemide (LASIX) tablet 80 mg  80 mg Oral Daily Pixie Casino, MD   80 mg at 11/23/13 1033  . hydrALAZINE (APRESOLINE) tablet 10 mg  10 mg Oral 3 times per day Pixie Casino, MD   10 mg at 11/23/13 0522  . losartan (COZAAR) tablet 25 mg  25 mg Oral Daily Pixie Casino, MD   25 mg at 11/23/13  1033  . multivitamin with minerals tablet 1 tablet  1 tablet Oral Daily Lorretta Harp, MD   1 tablet at 11/23/13 1032  . pantoprazole (PROTONIX) EC tablet 40 mg  40 mg Oral BID AC Darlin Coco, MD   40 mg at 11/23/13 0522  . potassium chloride SA (K-DUR,KLOR-CON) CR tablet 40 mEq  40 mEq Oral BID Evelene Croon Barrett, PA-C   40 mEq at 11/23/13 1033  . sodium chloride 0.9 % injection 10-40 mL  10-40 mL Intracatheter Q12H Darlin Coco, MD   10 mL at 11/20/13 0903  . sodium chloride 0.9 % injection 10-40 mL  10-40 mL Intracatheter PRN Darlin Coco, MD   30 mL at 11/23/13 0446  . spironolactone (ALDACTONE) tablet 12.5 mg  12.5 mg Oral Daily Sueanne Margarita, MD   12.5 mg at 11/23/13 1033  . thiamine (VITAMIN B-1) tablet 100 mg  100 mg Oral Daily Lorretta Harp, MD   100 mg at 11/23/13 1032    Physical Exam: General appearance: alert, no distress and somewhat irritated Lungs: diminished breath sounds bibasilar Heart: regular rate and rhythm, S1, S2 normal, no S3 or S4 and systolic murmur: early systolic 2/6, blowing at apex Extremities: edema  trace to 1+ Pulses: 2+ and symmetric  Lab Results: Results for orders placed during the hospital encounter of 11/12/13 (from the past 48 hour(s))  CARBOXYHEMOGLOBIN     Status: Abnormal   Collection Time    11/21/13  4:15 PM      Result Value Ref Range   Total hemoglobin 11.7 (*) 13.5 - 18.0 g/dL   O2 Saturation 55.3     Carboxyhemoglobin 2.3 (*) 0.5 - 1.5 %   Methemoglobin 1.0  0.0 - 1.5 %  COMPREHENSIVE METABOLIC PANEL     Status: Abnormal   Collection Time    11/22/13  5:12 AM      Result Value Ref Range   Sodium 137  137 - 147 mEq/L   Potassium 3.8  3.7 - 5.3 mEq/L   Chloride 89 (*) 96 - 112 mEq/L   CO2 37 (*) 19 - 32 mEq/L   Glucose, Bld 139 (*) 70 - 99 mg/dL   BUN 23  6 - 23 mg/dL   Creatinine, Ser 1.13  0.50 - 1.35 mg/dL   Calcium 9.4  8.4 - 10.5 mg/dL   Total Protein 6.7  6.0 - 8.3 g/dL   Albumin 3.1 (*) 3.5 - 5.2 g/dL    AST 81 (*) 0 - 37 U/L   ALT 89 (*) 0 - 53 U/L   Alkaline Phosphatase 222 (*) 39 - 117 U/L   Total Bilirubin 1.5 (*) 0.3 - 1.2 mg/dL   GFR calc non Af Amer 64 (*) >90 mL/min   GFR calc Af Amer 74 (*) >90 mL/min   Comment: (NOTE)     The eGFR has been calculated using the CKD EPI equation.     This calculation has not been validated in all clinical situations.     eGFR's persistently <90 mL/min signify possible Chronic Kidney     Disease.   Anion gap 11  5 - 15  BASIC METABOLIC PANEL     Status: Abnormal   Collection Time    11/23/13  4:46 AM      Result Value Ref Range   Sodium 137  137 - 147 mEq/L   Potassium 4.1  3.7 - 5.3 mEq/L   Chloride 89 (*) 96 - 112 mEq/L   CO2 34 (*) 19 - 32 mEq/L   Glucose, Bld 163 (*) 70 - 99 mg/dL   BUN 26 (*) 6 - 23 mg/dL   Creatinine, Ser 1.21  0.50 - 1.35 mg/dL   Calcium 9.5  8.4 - 10.5 mg/dL   GFR calc non Af Amer 58 (*) >90 mL/min   GFR calc Af Amer 68 (*) >90 mL/min   Comment: (NOTE)     The eGFR has been calculated using the CKD EPI equation.     This calculation has not been validated in all clinical situations.     eGFR's persistently <90 mL/min signify possible Chronic Kidney     Disease.   Anion gap 14  5 - 15  PRO B NATRIURETIC PEPTIDE     Status: Abnormal   Collection Time    11/23/13  4:46 AM      Result Value Ref Range   Pro B Natriuretic peptide (BNP) 1890.0 (*) 0 - 125 pg/mL    Imaging: No results found.  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 Increase lasix to 80 mg po BID today. Check co-ox. Hold d/c until tomorrow (tentatively) as we may not be on the  appropriate po lasix dose.  Time Spent Directly with Patient:  15 minutes  Length of Stay:  LOS: 11 days   Pixie Casino, MD, [facility] Attending Cardiologist [facility]  HILTY,Brandley C 11/23/2013, 10:58 AM

## 2013-11-24 ENCOUNTER — Encounter (HOSPITAL_COMMUNITY): Payer: Self-pay | Admitting: Internal Medicine

## 2013-11-24 DIAGNOSIS — J449 Chronic obstructive pulmonary disease, unspecified: Secondary | ICD-10-CM

## 2013-11-24 DIAGNOSIS — Z515 Encounter for palliative care: Secondary | ICD-10-CM

## 2013-11-24 HISTORY — DX: Chronic obstructive pulmonary disease, unspecified: J44.9

## 2013-11-24 MED ORDER — MORPHINE SULFATE (CONCENTRATE) 10 MG /0.5 ML PO SOLN: 5.0000 mg | ORAL | Status: DC | PRN

## 2013-11-24 MED ORDER — SALINE SPRAY 0.65 % NA SOLN
1.0000 | NASAL | Status: DC | PRN
  Filled 2013-11-24: qty 44

## 2013-11-24 MED ORDER — IPRATROPIUM BROMIDE 0.02 % IN SOLN
0.5000 mg | Freq: Four times a day (QID) | RESPIRATORY_TRACT | Status: DC
  Administered 2013-11-24 – 2013-11-25 (×4): 0.5 mg via RESPIRATORY_TRACT
  Filled 2013-11-24 (×4): qty 2.5

## 2013-11-24 MED ORDER — POTASSIUM CHLORIDE CRYS ER 20 MEQ PO TBCR
40.0000 meq | EXTENDED_RELEASE_TABLET | Freq: Every day | ORAL | Status: DC
  Administered 2013-11-25: 40 meq via ORAL
  Filled 2013-11-24: qty 2

## 2013-11-24 MED ORDER — ARFORMOTEROL TARTRATE 15 MCG/2ML IN NEBU
15.0000 ug | INHALATION_SOLUTION | Freq: Two times a day (BID) | RESPIRATORY_TRACT | Status: DC
  Administered 2013-11-24 – 2013-11-25 (×2): 15 ug via RESPIRATORY_TRACT
  Filled 2013-11-24 (×4): qty 2

## 2013-11-24 MED ORDER — ALPRAZOLAM 0.5 MG PO TABS: 0.5000 mg | ORAL_TABLET | Freq: Every evening | ORAL | Status: DC | PRN

## 2013-11-24 MED ORDER — ACETAMINOPHEN 500 MG PO TABS
1000.0000 mg | ORAL_TABLET | Freq: Every day | ORAL | Status: DC
  Administered 2013-11-24: 1000 mg via ORAL
  Filled 2013-11-24 (×2): qty 2

## 2013-11-24 NOTE — Consult Note (Signed)
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 He currently has a CRT-D Cardiac Resynchronization Therapy Defibrillator implanted for BiV Failure, but his echo showed significantly worse systolic function. He is essentially immobile at home and the extent of his activity is from bed to chair to bathroom.  He has a wife and three daughters who care for him. I met with them to discuss goals of care. We discussed his current condition, possible disease trajectories and overall poor prognosis. They tell me he retired 10 years ago and since then has not gotten off the couch, they have been tough on him and feel that he has contributed to his condition- I reframed the focus on his very severe heart failure and that perhaps at this point instead of tough love, they could just try love. They have been trying to get him up to exercise everyday and really pushing him hard to improve-I think they have realized that this isn't volitional-but really just that he has severe irreversible CHF and that walking to the bathroom and getting in his chair are probably the extent of his activity level.  They are asking specifically about Hospice services- I think Mr. Grau is appropriate for thsi service. He is agreeable if it helps him get home and stay out of the hospital.  Summary of Goals:  1. DNR 2. Avoid Rehospitalization 3. Maximize medical management of his heart failure 4. Manage his pain and dyspnea-dyspnea has been a major problem for him- he coughs and wheezes frequently, very little reserve-I reviewed past CT's and his PFTs-maximize his pulmonary treatments and diuresis for comfort.  Recommendations:  1. Home with Hospice tomorrow if Hospice RN can admit day of discharge. 2. Continue  all Cardiac Medications 3.* Note he has a CRT-Defibrillator, at some point a discussion needs to be had about the defibrillation feature on this device- I am not familiar with this device specifically or if there is a way to inactivate the shock feature but maintain the pacing and volume sensing features- would appreciate if cardiology could address this. Ok to leave this on and continue to remotely manage and still receive hospice typically.  4. Maximized his dyspnea regimen to include BID Brovana Neb and Ipratropium Nebs q6, which will have least amount of cardiac effects or tachyarrhythmias, Recommend 2L continuous O2 for comfort. 5. Added Roxanol SL PRN low dose for severe dyspnea 6. Alprazolam prn for sleep and anxiety 7. Tylenol scheduled qhs for mild PM pain and sleep. 8. I discontinued his telemetry for comfort this PM based on his goals and discharge with hospice tomorrow.  Lane Hacker, DO Palliative Medicine [phone]

## 2013-11-24 NOTE — Progress Notes (Signed)
Patient Name: [name] Date of Encounter: 11/24/2013  Principal Problem:   Acute on chronic systolic and diastolic heart failure, NYHA class 4 Active Problems:   CAD (coronary artery disease)   LBBB (left bundle branch block)   Biventricular implantable cardioverter-defibrillator in situ - St Jude   Aortic valve replaced   Other encephalopathy   Persistent atrial fibrillation   Jaundice   Acute blood loss anemia   Hypokalemia   Length of Stay: 12  SUBJECTIVE  Looks and feels better. In/Out records are not accurate. He has urge incontinence and some hematuria following a traumatic Foley removal. No labs today. Weight unchanged.  CURRENT MEDS . antiseptic oral rinse  7 mL Mouth Rinse BID  . carvedilol  6.25 mg Oral BID WC  . clopidogrel  75 mg Oral Daily  . ferrous sulfate  325 mg Oral BID WC  . folic acid  1 mg Oral Daily  . furosemide  80 mg Oral BID  . hydrALAZINE  10 mg Oral 3 times per day  . losartan  25 mg Oral Daily  . multivitamin with minerals  1 tablet Oral Daily  . pantoprazole  40 mg Oral BID AC  . potassium chloride  40 mEq Oral BID  . sodium chloride  10-40 mL Intracatheter Q12H  . spironolactone  12.5 mg Oral Daily  . thiamine  100 mg Oral Daily    OBJECTIVE   Intake/Output Summary (Last 24 hours) at 11/24/13 1134 Last data filed at 11/24/13 0950  Gross per 24 hour  Intake    780 ml  Output    201 ml  Net    579 ml   Filed Weights   11/22/13 0632 11/23/13 0408 11/24/13 0540  Weight: 226 lb 13.7 oz (102.9 kg) 229 lb 4.5 oz (104 kg) 229 lb 11.2 oz (104.191 kg)    PHYSICAL EXAM Filed Vitals:   11/23/13 1718 11/23/13 2025 11/24/13 0540 11/24/13 0838  BP: 110/58 98/64 105/67 106/59  Pulse: 70 74 69 78  Temp:  98.4 F (36.9 C) 97.9 F (36.6 C)   TempSrc:  Oral Oral   Resp:  20 18   Height:      Weight:   229 lb 11.2 oz (104.191 kg)   SpO2:  100% 100%    General: Alert, oriented x3, no distress. No jaundice Head: no evidence of  trauma, PERRL, EOMI, no exophtalmos or lid lag, no myxedema, no xanthelasma; normal ears, nose and oropharynx Neck: normal (3-4 cm) jugular venous pulsations and no hepatojugular reflux; brisk carotid pulses without delay and no carotid bruits Chest: clear to auscultation, no signs of consolidation by percussion or palpation, normal fremitus, symmetrical and full respiratory excursions Cardiovascular: normal position and quality of the apical impulse, regular rhythm, normal first and second heart sounds, no rubs or gallops, no murmur Abdomen: no tenderness or distention, no masses by palpation, no abnormal pulsatility or arterial bruits, normal bowel sounds, mild hepatomegaly Extremities: no clubbing, cyanosis, still with 2+ pedal edema, but pretibial edema is goneedema; 2+ radial, ulnar and brachial pulses bilaterally; 2+ right femoral, posterior tibial and dorsalis pedis pulses; 2+ left femoral, posterior tibial and dorsalis pedis pulses; no subclavian or femoral bruits Neurological: grossly nonfocal  LABS  Basic Metabolic Panel  Recent Labs  11/22/13 0512 11/23/13 0446  NA 137 137  K 3.8 4.1  CL 89* 89*  CO2 37* 34*  GLUCOSE 139* 163*  BUN 23 26*  CREATININE 1.13 1.21  CALCIUM 9.4  9.5   Liver Function Tests  Recent Labs  11/22/13 0512  AST 81*  ALT 89*  ALKPHOS 222*  BILITOT 1.5*  PROT 6.7  ALBUMIN 3.1*   No results found for this basename: LIPASE, AMYLASE,  in the last 72 hours  Radiology Studies Imaging results have been reviewed and No results found.  TELE AF, BiV paced  ASSESSMENT AND PLAN Severe chronic illness, with some subtle signs of improvement. Unfortunately, volume status assessment is difficult due to incontinence and no labs today. Recheck labs tomorrow. He never appeared to be a BiV responder, but he has deteriorated while in AF. Pacemaker interrogation shows volume retention (decreased thoracic impedance) began weeks before the AF. Not a good  candidate for cardioversion due to inability to anticoagulate. Improvement is welcome, but long term prognosis is poor. Home hospice care for stage D classIV systolic HF is appropriate.   Thurmon FairMihai Abiageal Blowe, MD, [facility] [phone] office [phone] pager 11/24/2013 11:34 AM

## 2013-11-24 NOTE — Progress Notes (Signed)
Palliative Medicine Team  I spoke with Mrs. [name] this morning regarding her request for hospice services and I have reviewed [name] medical record in detail. Based on his advanced heart failure he would certainly be an appropriate hospice referral. I spoke with Mrs. [name] about goals of care- she understands that hospice services are provided to people who have serious illness and may have <6 months to live. They have prior experience with hospice with other family members. She is not able to come to the hospital this morning but will be here this afternoon-I am available to meet with them and answer any additional questions around 3:30PM today. I also told her that the primary team may be ready to discharge him before we can meet this afternoon- this will be fine as long as the hospice referral has been made and a provider can see [name] and do the Hospice admission this weekend. I also clarified code status and he should be DNR.   Will plan on meeting them at 330PM unless he is discharged at which time the Hospice Team will assume further discussion of goals of care.  Anderson Malta, DO Palliative Medicine [phone]

## 2013-11-25 DIAGNOSIS — J984 Other disorders of lung: Secondary | ICD-10-CM

## 2013-11-25 LAB — COMPREHENSIVE METABOLIC PANEL
ALT: 63 U/L — ABNORMAL HIGH (ref 0–53)
AST: 56 U/L — AB (ref 0–37)
Albumin: 2.9 g/dL — ABNORMAL LOW (ref 3.5–5.2)
Alkaline Phosphatase: 169 U/L — ABNORMAL HIGH (ref 39–117)
Anion gap: 12 (ref 5–15)
BILIRUBIN TOTAL: 1.1 mg/dL (ref 0.3–1.2)
BUN: 30 mg/dL — ABNORMAL HIGH (ref 6–23)
CHLORIDE: 97 meq/L (ref 96–112)
CO2: 31 meq/L (ref 19–32)
CREATININE: 1.31 mg/dL (ref 0.50–1.35)
Calcium: 9.6 mg/dL (ref 8.4–10.5)
GFR calc Af Amer: 62 mL/min — ABNORMAL LOW (ref >90)
GFR, EST NON AFRICAN AMERICAN: 53 mL/min — AB (ref >90)
Glucose, Bld: 109 mg/dL — ABNORMAL HIGH (ref 70–99)
Potassium: 4.1 mEq/L (ref 3.7–5.3)
SODIUM: 140 meq/L (ref 137–147)
Total Protein: 6.2 g/dL (ref 6.0–8.3)

## 2013-11-25 LAB — HEMOGLOBIN AND HEMATOCRIT, BLOOD
HEMATOCRIT: 33.3 % — AB (ref 39.0–52.0)
HEMOGLOBIN: 9.4 g/dL — AB (ref 13.0–17.0)

## 2013-11-25 LAB — PRO B NATRIURETIC PEPTIDE: Pro B Natriuretic peptide (BNP): 1322 pg/mL — ABNORMAL HIGH (ref 0–125)

## 2013-11-25 MED ORDER — LOSARTAN POTASSIUM 25 MG PO TABS: 25.0000 mg | ORAL_TABLET | Freq: Every day | ORAL | Status: DC

## 2013-11-25 MED ORDER — MORPHINE SULFATE (CONCENTRATE) 10 MG /0.5 ML PO SOLN: 5.0000 mg | ORAL | Status: DC | PRN

## 2013-11-25 MED ORDER — ALPRAZOLAM 0.5 MG PO TABS: 0.5000 mg | ORAL_TABLET | Freq: Every evening | ORAL | Status: DC | PRN

## 2013-11-25 MED ORDER — ACETAMINOPHEN 325 MG PO TABS: 650.0000 mg | ORAL_TABLET | Freq: Four times a day (QID) | ORAL | Status: DC | PRN

## 2013-11-25 MED ORDER — UMECLIDINIUM-VILANTEROL 62.5-25 MCG/INH IN AEPB: 1.0000 | INHALATION_SPRAY | Freq: Every day | RESPIRATORY_TRACT | Status: DC

## 2013-11-25 MED ORDER — CLOPIDOGREL BISULFATE 75 MG PO TABS: 75.0000 mg | ORAL_TABLET | Freq: Every day | ORAL | Status: DC

## 2013-11-25 MED ORDER — FUROSEMIDE 80 MG PO TABS: 80.0000 mg | ORAL_TABLET | Freq: Two times a day (BID) | ORAL | Status: DC

## 2013-11-25 MED ORDER — POTASSIUM CHLORIDE CRYS ER 20 MEQ PO TBCR: 40.0000 meq | EXTENDED_RELEASE_TABLET | Freq: Every day | ORAL | Status: DC

## 2013-11-25 NOTE — Progress Notes (Signed)
    Subjective:  He denies any increased SOB. He says he is ready to go home.  Objective:  Vital Signs in the last 24 hours: Temp:  [97.4 F (36.3 C)-98.2 F (36.8 C)] 97.4 F (36.3 C) (08/16 0300) Pulse Rate:  [69-78] 73 (08/16 0300) Resp:  [18-20] 18 (08/16 0300) BP: (84-106)/(41-59) 84/41 mmHg (08/16 0300) SpO2:  [91 %-100 %] 98 % (08/16 0300) Weight:  [224 lb 14.4 oz (102.014 kg)] 224 lb 14.4 oz (102.014 kg) (08/16 0300)  Intake/Output from previous day:  Intake/Output Summary (Last 24 hours) at 11/25/13 0733 Last data filed at 11/25/13 0700  Gross per 24 hour  Intake    750 ml  Output   1100 ml  Net   -350 ml    Physical Exam: General appearance: alert, cooperative, no distress and moderately obese Lungs: clear to auscultation bilaterally Heart: regular rate and rhythm and 2/6 systolic murmur Extremities: 1-2+ edema    Rhythm: Off telemetry  Lab Results:  Recent Labs  11/25/13 0500  HGB 9.4*    Recent Labs  11/23/13 0446 11/25/13 0500  NA 137 140  K 4.1 4.1  CL 89* 97  CO2 34* 31  GLUCOSE 163* 109*  BUN 26* 30*  CREATININE 1.21 1.31   No results found for this basename: TROPONINI, CK, MB,  in the last 72 hours No results found for this basename: INR,  in the last 72 hours  Imaging: Imaging results have been reviewed  Cardiac Studies:  Assessment/Plan:  73 y/o with advanced Ischemic CHF EF 25%, BiV ICD non responder, CAF, COPD with pulmonary hypertension, cirrhosis, and limited functional status admitted 11/12/13 with Acute on Chronic CHF and severe dyspnea. He has diuresed 18.8L- 22 lbs. He is at the end of medical Rx options.He has been seen by Palliative Care and will go home with Hospice Care.    Principal Problem:   Acute on chronic systolic and diastolic heart failure, NYHA class 4 Active Problems:   CAD (coronary artery disease)   Persistent atrial fibrillation   COPD (chronic obstructive pulmonary disease)   LBBB (left bundle branch  block)   Biventricular implantable cardioverter-defibrillator in situ - St Jude   Aortic valve replaced   Other encephalopathy   Cirrhosis   Anemia   Hypokalemia    PLAN: Discharge with Hospice- will review meds with MD.   Corine Shelter PA-C [facility] [phone] 11/25/2013, 7:33 AM I have seen and examined the patient along with Corine Shelter PA-C.  I have reviewed the chart, notes and new data.  I agree with PA's note.  Key new complaints: he feels well and wants to go home; home hospice arranged yesterday Key examination changes: still has 1-2 + pitting ankle edema, R>L, but JVP flat sitting up, no rales; irregular rhythm Key new findings / data: Hgb and renal function are stable  PLAN: DC home with home hospice. DNR. Roxanol prn breathlessness or pain. Need to turn off ICD tachy therapies before DC home.  Thurmon Fair, MD, [facility] [phone] 11/25/2013, 9:50 AM

## 2013-11-25 NOTE — Progress Notes (Signed)
Discussed discharge instructions with spouse over the phone. Spouse verbalized understanding and denied any questions. Pt being transported home via EMS. Prescriptions and DNR form sent with EMS in packet.  Juliane Lack, RN

## 2013-11-25 NOTE — Clinical Social Work Note (Signed)
CSW made aware of patient d/c by [facility] and ambulance transportation needed by patient for d/c home. RNCM confirmed address. CSW prepared d/c packet and placed in patient's shadow chart. RN aware. CSW to arrange transportation via [place]. No further needs. CSW signing off.  [name], LCSWA Weekend Clinical Social Worker [phone]

## 2013-11-25 NOTE — Discharge Instructions (Signed)

## 2013-11-25 NOTE — Discharge Summary (Signed)
Patient ID: [name],  MRN: [id number], DOB/AGE: 14-May-1940 73 y.o.  Admit date: 11/12/2013 Discharge date: 8/16Harl Favor/2015  Primary Care Provider: Dr Salena Saner. [facility] Primary Cardiologist: Dr Royann Shiversroitoru  Discharge Diagnoses Principal Problem:   Acute on chronic systolic and diastolic heart failure, NYHA class 4 Active Problems:   CAD (coronary artery disease)   Persistent atrial fibrillation   COPD (chronic obstructive pulmonary disease)   LBBB (left bundle branch block)   Biventricular implantable cardioverter-defibrillator in situ - St Jude   Aortic valve replaced   Other encephalopathy   Cirrhosis   Anemia   Hypokalemia    Procedures: [facility]   Hospital Course:  73 y/o with advanced Ischemic CHF- EF 25%, s/p CABG and AVR, BiV ICD non responder, CAF, COPD with pulmonary hypertension, cirrhosis, and limited functional status who was admitted 11/12/13 with Acute on Chronic CHF and severe anemia with a HGb of 6.3. He was noted to be in AF. He was also noted to be jaundiced and had elevated LFTs. He was transfused and seen by GI. Endoscopy revealed portal HTN with a ? Of an AVM- it was not felt his his anemia was secondary to an upper GI bleed. Echo revealed an EF of 25-30%. He was seen by Dr Ladona Ridgelaylor for AF and he was not felt to be a candidate for DCCV (not a candidate for NOAC)-plan is for rate control. He was diuresed 18.8L- 22 lbs. He is improved symptomatically but it is felt he is at the end of medical Rx options. He was been seen by Palliative Care and the decision was made for him to go home with Hospice Care. His ICD will be turned off at discharge.    Discharge Vitals:  Blood pressure 110/58, pulse 76, temperature 98.1 F (36.7 C), temperature source Oral, resp. rate 18, height 6\' 1"  (1.854 m), weight 224 lb 14.4 oz (102.014 kg), SpO2 94.00%.    Labs: Results for orders placed during the hospital encounter of 11/12/13 (from the past 24 hour(s))    COMPREHENSIVE METABOLIC PANEL     Status: Abnormal   Collection Time    11/25/13  5:00 AM      Result Value Ref Range   Sodium 140  137 - 147 mEq/L   Potassium 4.1  3.7 - 5.3 mEq/L   Chloride 97  96 - 112 mEq/L   CO2 31  19 - 32 mEq/L   Glucose, Bld 109 (*) 70 - 99 mg/dL   BUN 30 (*) 6 - 23 mg/dL   Creatinine, Ser 4.091.31  0.50 - 1.35 mg/dL   Calcium 9.6  8.4 - 81.110.5 mg/dL   Total Protein 6.2  6.0 - 8.3 g/dL   Albumin 2.9 (*) 3.5 - 5.2 g/dL   AST 56 (*) 0 - 37 U/L   ALT 63 (*) 0 - 53 U/L   Alkaline Phosphatase 169 (*) 39 - 117 U/L   Total Bilirubin 1.1  0.3 - 1.2 mg/dL   GFR calc non Af Amer 53 (*) >90 mL/min   GFR calc Af Amer 62 (*) >90 mL/min   Anion gap 12  5 - 15  HEMOGLOBIN AND HEMATOCRIT, BLOOD     Status: Abnormal   Collection Time    11/25/13  5:00 AM      Result Value Ref Range   Hemoglobin 9.4 (*) 13.0 - 17.0 g/dL   HCT 91.433.3 (*) 78.239.0 - 95.652.0 %  PRO B NATRIURETIC PEPTIDE  Status: Abnormal   Collection Time    11/25/13  5:00 AM      Result Value Ref Range   Pro B Natriuretic peptide (BNP) 1322.0 (*) 0 - 125 pg/mL    Disposition:  Follow-up Information   Follow up with CLEGG,AMY, NP On 11/29/2013. (at 2:45pm in the [facility])    Specialty:  Nurse Practitioner   Contact information:   [address] [place] [address] [phone]       Discharge Medications:    Medication List    STOP taking these medications       atorvastatin 20 MG tablet  Commonly known as:  LIPITOR     pantoprazole 40 MG tablet  Commonly known as:  PROTONIX      TAKE these medications       acetaminophen 325 MG tablet  Commonly known as:  TYLENOL  Take 2 tablets (650 mg total) by mouth every 6 (six) hours as needed for mild pain or moderate pain.     albuterol 108 (90 BASE) MCG/ACT inhaler  Commonly known as:  PROVENTIL HFA;VENTOLIN HFA  Inhale 2 puffs into the lungs every 4 (four) hours as needed for wheezing or shortness of breath.      allopurinol 300 MG tablet  Commonly known as:  ZYLOPRIM  Take 300 mg by mouth daily.     ALPRAZolam 0.5 MG tablet  Commonly known as:  XANAX  Take 1 tablet (0.5 mg total) by mouth at bedtime as needed for anxiety or sleep.     amitriptyline 25 MG tablet  Commonly known as:  ELAVIL  Take 25 mg by mouth at bedtime.     carvedilol 6.25 MG tablet  Commonly known as:  COREG  Take 1 tablet (6.25 mg total) by mouth 2 (two) times daily with a meal.     clopidogrel 75 MG tablet  Commonly known as:  PLAVIX  Take 1 tablet (75 mg total) by mouth daily.     cyanocobalamin 100 MCG tablet  Take 100 mcg by mouth daily.     famotidine 20 MG tablet  Commonly known as:  PEPCID  Take 20 mg by mouth at bedtime.     folic acid 1 MG tablet  Commonly known as:  FOLVITE  Take 1 mg by mouth daily.     furosemide 80 MG tablet  Commonly known as:  LASIX  Take 1 tablet (80 mg total) by mouth 2 (two) times daily.     losartan 25 MG tablet  Commonly known as:  COZAAR  Take 1 tablet (25 mg total) by mouth daily.     morphine CONCENTRATE 10 mg / 0.5 ml concentrated solution  Take 0.25 mLs (5 mg total) by mouth every 2 (two) hours as needed for moderate pain, severe pain, anxiety or shortness of breath.     potassium chloride SA 20 MEQ tablet  Commonly known as:  K-DUR,KLOR-CON  Take 2 tablets (40 mEq total) by mouth daily.     Umeclidinium-Vilanterol 62.5-25 MCG/INH Aepb  Inhale 1 puff into the lungs daily.         Duration of Discharge Encounter: Greater than 30 minutes including physician time.  Jolene Provost PA-C 11/25/2013 9:59 AM

## 2013-11-27 ENCOUNTER — Telehealth: Payer: Self-pay | Admitting: *Deleted

## 2013-11-27 NOTE — Telephone Encounter (Signed)
Order for Hospice Care faxed to [facility].

## 2013-11-29 ENCOUNTER — Inpatient Hospital Stay (HOSPITAL_COMMUNITY): Admit: 2013-11-29 | Payer: Medicare HMO

## 2013-12-03 ENCOUNTER — Ambulatory Visit (HOSPITAL_COMMUNITY)
Admission: RE | Admit: 2013-12-03 | Discharge: 2013-12-03 | Disposition: A | Discharge status: Home / Self Care | Payer: Medicare HMO | Source: Ambulatory Visit | Attending: Internal Medicine | Admitting: Internal Medicine

## 2013-12-03 ENCOUNTER — Encounter (HOSPITAL_COMMUNITY): Payer: Self-pay

## 2013-12-03 ENCOUNTER — Encounter: Payer: Commercial Managed Care - HMO | Admitting: Cardiovascular Disease

## 2013-12-03 VITALS — BP 100/60 | HR 99 | Wt 231.8 lb

## 2013-12-03 DIAGNOSIS — I428 Other cardiomyopathies: Secondary | ICD-10-CM | POA: Diagnosis not present

## 2013-12-03 DIAGNOSIS — D649 Anemia, unspecified: Secondary | ICD-10-CM | POA: Diagnosis not present

## 2013-12-03 DIAGNOSIS — I447 Left bundle-branch block, unspecified: Secondary | ICD-10-CM | POA: Diagnosis not present

## 2013-12-03 DIAGNOSIS — J4489 Other specified chronic obstructive pulmonary disease: Secondary | ICD-10-CM | POA: Insufficient documentation

## 2013-12-03 DIAGNOSIS — Z8249 Family history of ischemic heart disease and other diseases of the circulatory system: Secondary | ICD-10-CM | POA: Diagnosis not present

## 2013-12-03 DIAGNOSIS — I2510 Atherosclerotic heart disease of native coronary artery without angina pectoris: Secondary | ICD-10-CM | POA: Diagnosis not present

## 2013-12-03 DIAGNOSIS — K766 Portal hypertension: Secondary | ICD-10-CM | POA: Diagnosis not present

## 2013-12-03 DIAGNOSIS — I509 Heart failure, unspecified: Secondary | ICD-10-CM | POA: Insufficient documentation

## 2013-12-03 DIAGNOSIS — I5022 Chronic systolic (congestive) heart failure: Secondary | ICD-10-CM | POA: Insufficient documentation

## 2013-12-03 DIAGNOSIS — I4891 Unspecified atrial fibrillation: Secondary | ICD-10-CM

## 2013-12-03 DIAGNOSIS — Z66 Do not resuscitate: Secondary | ICD-10-CM | POA: Diagnosis not present

## 2013-12-03 DIAGNOSIS — I359 Nonrheumatic aortic valve disorder, unspecified: Secondary | ICD-10-CM | POA: Insufficient documentation

## 2013-12-03 DIAGNOSIS — Z954 Presence of other heart-valve replacement: Secondary | ICD-10-CM | POA: Insufficient documentation

## 2013-12-03 DIAGNOSIS — I4819 Other persistent atrial fibrillation: Secondary | ICD-10-CM

## 2013-12-03 DIAGNOSIS — K7460 Unspecified cirrhosis of liver: Secondary | ICD-10-CM | POA: Insufficient documentation

## 2013-12-03 DIAGNOSIS — J449 Chronic obstructive pulmonary disease, unspecified: Secondary | ICD-10-CM | POA: Diagnosis not present

## 2013-12-03 DIAGNOSIS — Z951 Presence of aortocoronary bypass graft: Secondary | ICD-10-CM | POA: Diagnosis not present

## 2013-12-03 MED ORDER — METOLAZONE 2.5 MG PO TABS: 2.5000 mg | ORAL_TABLET | Freq: Every day | ORAL | Status: DC

## 2013-12-03 NOTE — Progress Notes (Signed)
Patient ID: [name], male   DOB: [birth year], 73 y.o.   MRN: [id number] PCP: Primary Cardiologist:  HPI:  73 y/o with advanced Ischemic CHF- EF 25%, s/p CABG and AVR, BiV ICD non responder, CAF, COPD with pulmonary hypertension, cirrhosis, and limited functional status who was admitted 11/12/13 with Acute on Chronic CHF and severe anemia with a HGb of 6.3. He was noted to be in AF. He was also noted to be jaundiced and had elevated LFTs. He was transfused and seen by GI. Endoscopy revealed portal HTN with a ? Of an AVM- it was not felt his his anemia was secondary to an upper GI bleed. Echo revealed an EF of 25-30%. He was seen by Dr Ladona Ridgel for AF and he was not felt to be a candidate for DCCV (not a candidate for NOAC)-plan is for rate control. He was diuresed 18.8L- 22 lbs. He is improved symptomatically but it is felt he is at the end of medical Rx options. He was been seen by Palliative Care and the decision was made for him to go home with Hospice Care. His ICD will be turned off at discharge.   Admitted 8/3 - 8/16.  Now followed by [facility].   228 on d/c Home 231. Not weighing every day.   Doesn't get around much. SOB with minimal exertion which is stable. + hemorrhoidal bleeding. Minimal edema. No CP.      ROS: All systems negative except as listed in HPI, PMH and Problem List.  SH:  History   Social History  . Marital Status: Married    Spouse Name: N/A    Number of Children: N/A  . Years of Education: N/A   Occupational History  . retired    Social History Main Topics  . Smoking status: Former Smoker -- 1.50 packs/day for 36 years    Types: Cigarettes    Quit date: 04/12/1992  . Smokeless tobacco: Never Used  . Alcohol Use: Yes     Comment: moderate  . Drug Use: No  . Sexual Activity: Not on file   Other Topics Concern  . Not on file   Social History Narrative  . No narrative on file    FH:  Family History  Problem Relation Age of Onset   . Diabetes Mother   . Hypertension Mother   . Heart attack Father   . Heart attack Brother   . Diabetes Brother   . Cancer Sister   . Diabetes Sister   . Hypertension Sister     Past Medical History  Diagnosis Date  . Nonischemic cardiomyopathy     EF 25-30%  . Valvular cardiomyopathy   . Aortic valve replaced     x2 2002 & 2009 Medtronic free style aortic root  . CAD (coronary artery disease)     s/p CABG  . LBBB (left bundle branch block)     QRS  . CHF (congestive heart failure)   . Hypertension   . Acute blood loss anemia 11/14/2013  . COPD (chronic obstructive pulmonary disease) 11/24/2013    Current Outpatient Prescriptions  Medication Sig Dispense Refill  . acetaminophen (TYLENOL) 325 MG tablet Take 2 tablets (650 mg total) by mouth every 6 (six) hours as needed for mild pain or moderate pain.      Marland Kitchen allopurinol (ZYLOPRIM) 300 MG tablet Take 300 mg by mouth daily.        Marland Kitchen ALPRAZolam (XANAX) 0.5 MG tablet Take 1 tablet (  0.5 mg total) by mouth at bedtime as needed for anxiety or sleep.  30 tablet  0  . carvedilol (COREG) 6.25 MG tablet Take 1 tablet (6.25 mg total) by mouth 2 (two) times daily with a meal.  60 tablet  0  . clopidogrel (PLAVIX) 75 MG tablet Take 1 tablet (75 mg total) by mouth daily.  30 tablet  5  . cyanocobalamin 100 MCG tablet Take 100 mcg by mouth daily.      . famotidine (PEPCID) 20 MG tablet Take 20 mg by mouth at bedtime.      . furosemide (LASIX) 80 MG tablet Take 1 tablet (80 mg total) by mouth 2 (two) times daily.  60 tablet  11  . losartan (COZAAR) 25 MG tablet Take 1 tablet (25 mg total) by mouth daily.  30 tablet  11  . potassium chloride SA (K-DUR,KLOR-CON) 20 MEQ tablet Take 2 tablets (40 mEq total) by mouth daily.  30 tablet  11  . Morphine Sulfate (MORPHINE CONCENTRATE) 10 mg / 0.5 ml concentrated solution Take 0.25 mLs (5 mg total) by mouth every 2 (two) hours as needed for moderate pain, severe pain, anxiety or shortness of  breath.  30 mL  0   No current facility-administered medications for this encounter.    Filed Vitals:   12/03/13 1525  BP: 100/60  Pulse: 99  Weight: 231 lb 12.8 oz (105.144 kg)  SpO2: 95%    PHYSICAL EXAM:  General:  Sitting on clinic table No resp difficulty HEENT: normal Neck: supple. JVP 6. Carotids 2+ bilaterally; no bruits. No lymphadenopathy or thryomegaly appreciated. Cor: PMI laterally displced. Regular rate & rhythm. No rubs, gallops or murmurs. Lungs: clear Abdomen: obese soft, nontender, ? Mildly distended. No hepatosplenomegaly. No bruits or masses. Good bowel sounds. Extremities: no cyanosis, clubbing, rash, minimal ankle edema Neuro: alert & orientedx3, cranial nerves grossly intact. Moves all 4 extremities w/o difficulty. Affect pleasant. No asterixis   ASSESSMENT & PLAN: 1) Chronic systolic HF EF 25% 2) CAD s/p CABG 3) AS s/p AVR 4) Cirrhosis with portal HTN 5) COPD 6) Anemia 7) DNR/DNI on Hospice  Improved since recent hospitalization but remains NYHA IIIB. Now on Hospice. Volume status looks good. Main issue will be volume management to help control symptoms. Stressed need to weigh daily and can take metolazone 2.5 mg with KCL 40 as needed when weight 234lbs or greater. Will continue to follow with Hospice and Dr. Rubie Maid. Can f/u with [place] prn.   Daniel Bensimhon,MD 4:02 PM

## 2013-12-03 NOTE — Patient Instructions (Addendum)
Take Metolazone 2.5 mg as needed for weight above 234 lbs When you take Lasix please also take  Extra Potassium to equal 40 meq   Follow-up as needed in the Heart Failure Clinic

## 2013-12-06 ENCOUNTER — Telehealth: Payer: Self-pay | Admitting: *Deleted

## 2013-12-06 NOTE — Telephone Encounter (Signed)
Signed orders for SN by [facility] faxed.

## 2013-12-11 ENCOUNTER — Encounter: Payer: Self-pay | Admitting: Cardiovascular Disease

## 2013-12-11 ENCOUNTER — Ambulatory Visit (INDEPENDENT_AMBULATORY_CARE_PROVIDER_SITE_OTHER): Admitting: Cardiovascular Disease

## 2013-12-11 VITALS — BP 122/68 | HR 55 | Ht 73.0 in | Wt 226.4 lb

## 2013-12-11 DIAGNOSIS — Z95810 Presence of automatic (implantable) cardiac defibrillator: Secondary | ICD-10-CM

## 2013-12-11 DIAGNOSIS — I5022 Chronic systolic (congestive) heart failure: Secondary | ICD-10-CM | POA: Diagnosis not present

## 2013-12-11 DIAGNOSIS — I4819 Other persistent atrial fibrillation: Secondary | ICD-10-CM

## 2013-12-11 DIAGNOSIS — K7460 Unspecified cirrhosis of liver: Secondary | ICD-10-CM

## 2013-12-11 DIAGNOSIS — I5043 Acute on chronic combined systolic (congestive) and diastolic (congestive) heart failure: Secondary | ICD-10-CM | POA: Diagnosis not present

## 2013-12-11 DIAGNOSIS — I447 Left bundle-branch block, unspecified: Secondary | ICD-10-CM

## 2013-12-11 DIAGNOSIS — I509 Heart failure, unspecified: Secondary | ICD-10-CM

## 2013-12-11 DIAGNOSIS — I4891 Unspecified atrial fibrillation: Secondary | ICD-10-CM | POA: Diagnosis not present

## 2013-12-11 DIAGNOSIS — Z952 Presence of prosthetic heart valve: Secondary | ICD-10-CM

## 2013-12-11 DIAGNOSIS — Z954 Presence of other heart-valve replacement: Secondary | ICD-10-CM

## 2013-12-11 DIAGNOSIS — J984 Other disorders of lung: Secondary | ICD-10-CM

## 2013-12-11 LAB — MDC_IDC_ENUM_SESS_TYPE_INCLINIC
Battery Remaining Longevity: 52.8 mo
Brady Statistic RA Percent Paced: 0 %
Date Time Interrogation Session: 20150901130929
HIGH POWER IMPEDANCE MEASURED VALUE: 64.125
Implantable Pulse Generator Model: 3265
Lead Channel Impedance Value: 325 Ohm
Lead Channel Impedance Value: 387.5 Ohm
Lead Channel Impedance Value: 825 Ohm
Lead Channel Pacing Threshold Amplitude: 0.5 V
Lead Channel Pacing Threshold Amplitude: 2.375 V
Lead Channel Pacing Threshold Pulse Width: 0.8 ms
Lead Channel Sensing Intrinsic Amplitude: 11.9 mV
Lead Channel Sensing Intrinsic Amplitude: 2.1 mV
Lead Channel Setting Pacing Amplitude: 1.5 V
Lead Channel Setting Pacing Amplitude: 3.375
Lead Channel Setting Pacing Pulse Width: 0.5 ms
Lead Channel Setting Sensing Sensitivity: 0.5 mV
MDC IDC MSMT LEADCHNL RA PACING THRESHOLD PULSEWIDTH: 0.5 ms
MDC IDC MSMT LEADCHNL RV PACING THRESHOLD AMPLITUDE: 1 V
MDC IDC MSMT LEADCHNL RV PACING THRESHOLD PULSEWIDTH: 0.5 ms
MDC IDC PG SERIAL: 7037237
MDC IDC SET LEADCHNL LV PACING PULSEWIDTH: 0.8 ms
MDC IDC SET LEADCHNL RV PACING AMPLITUDE: 2 V
MDC IDC STAT BRADY RV PERCENT PACED: 47 %
Zone Setting Detection Interval: 300 ms
Zone Setting Detection Interval: 340 ms

## 2013-12-11 NOTE — Progress Notes (Signed)
Patient ID: [name], male   DOB: Feb 25, 1941, 73 y.o.   MRN: [id number]      Reason for office visit Stage D heart failure  Dmitriy is now receiving palliative care for advanced heart failure. He was hospitalized with severe hypervolemia and manifestations of biventricular heart failure in the setting of profound anemia. He is in persistent atrial fibrillation, but the particular decompensation preceded the arrhythmia by several weeks. He also appears to have cirrhosis, at least by imaging studies and EGD findings of portal gastropathy.  Shortly after hospital discharge his defibrillator thoracic impedance monitor showed evidence of recurrence of low volume overload, but this corrected after about another week.  He is in much better spirits and looks healthier than he did last time he was in the office. He is no longer jaundiced and does not appear pale. His weight was 224 pounds at hospital discharge and he weighs 2 pounds more today. He weighed 248 pounds at the time of his admission. Discharge creatinine was 1.3, pro BNP 1322 (at admission 3747), hemoglobin 9.4 (admission 6.3).  ICD tachycardia therapies were turned "off" at the time of hospital discharge.  Mr. Blessinger has extensive history of structural heart disease including severely depressed left ventricular systolic function, coronary artery disease and heart disease. He has had 2 separate aortic valve replacement procedures  (2002, 2009) most recently a root replacement with a Medtronic freestyle root conduits and coronary reimplantation. He received a CRT-D device, but did not appear to respond, even after AV optimization. He has persistent and severe shortness of breath with NYHA functional class 3-4 status persistently, despite aggressive medical therapy.   Coronary angiography in January of 2013 showed a 80% stenosis of the ostium of the saphenous vein graft to the right coronary artery that could not explain the severity of his  cardiomyopathy. Lung function testing was then performed and shows evidence of moderate restrictive lung disease. His forced vital capacity is about 50% of normal and there is a commensurate reduction in his FEV1 and diffusion capacity. The total lung capacity is less severely reduced at about 72% of predicted. He used to work in a [employer] and in [employer] but has not had a history of significant occupational exposure to asbestos or other causes of occupational lung disease.  Abdominal ultrasound in August 2015 showed findings suggestive of "early cirrhosis". He also had ascites. EGD (Dr. Elnoria Howard) showed 1)Portal HTN gastropathy and no clear evidence of any bleeding source. 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    Allergies  Allergen Reactions  . Ace Inhibitors Cough    Current Outpatient Prescriptions  Medication Sig Dispense Refill  . acetaminophen (TYLENOL) 325 MG tablet Take 2 tablets (650 mg total) by mouth every 6 (six) hours as needed for mild pain or moderate pain.      Marland Kitchen allopurinol (ZYLOPRIM) 300 MG tablet Take 300 mg by mouth daily.        Marland Kitchen ALPRAZolam (XANAX) 0.5 MG tablet Take 1 tablet (0.5 mg total) by mouth at bedtime as needed for anxiety or sleep.  30 tablet  0  . carvedilol (COREG) 6.25 MG tablet Take 1 tablet (6.25 mg total) by mouth 2 (two) times daily with a meal.  60 tablet  0  . clopidogrel (PLAVIX) 75 MG tablet Take 1 tablet (75 mg total) by mouth daily.  30 tablet  5  . cyanocobalamin 100 MCG tablet Take 100 mcg by mouth daily.      . famotidine (PEPCID) 20 MG  tablet Take 20 mg by mouth at bedtime.      . furosemide (LASIX) 80 MG tablet Take 1 tablet (80 mg total) by mouth 2 (two) times daily.  60 tablet  11  . losartan (COZAAR) 25 MG tablet Take 1 tablet (25 mg total) by mouth daily.  30 tablet  11  . metolazone (ZAROXOLYN) 2.5 MG tablet Take 1 tablet (2.5 mg total) by mouth daily.  90 tablet  3  . Morphine Sulfate (MORPHINE CONCENTRATE) 10 mg / 0.5 ml concentrated  solution Take 0.25 mLs (5 mg total) by mouth every 2 (two) hours as needed for moderate pain, severe pain, anxiety or shortness of breath.  30 mL  0  . potassium chloride SA (K-DUR,KLOR-CON) 20 MEQ tablet Take 2 tablets (40 mEq total) by mouth daily.  30 tablet  11  . PROAIR HFA 108 (90 BASE) MCG/ACT inhaler as needed.       No current facility-administered medications for this visit.    Past Medical History  Diagnosis Date  . Nonischemic cardiomyopathy     EF 25-30%  . Valvular cardiomyopathy   . Aortic valve replaced     x2 2002 & 2009 Medtronic free style aortic root  . CAD (coronary artery disease)     s/p CABG  . LBBB (left bundle branch block)     QRS  . CHF (congestive heart failure)   . Hypertension   . Acute blood loss anemia 11/14/2013  . COPD (chronic obstructive pulmonary disease) 11/24/2013    Past Surgical History  Procedure Laterality Date  . Aortic valve replacement      s/p 2 separate AVR procedures, an initial mechanical AVR was replaced with a Medtronic Freestyle root in 2009 with reimplantation of the cors as well as redo SVG to RCA and preservation of LIMA to LAD  . Coronary artery bypass graft      2002  . Biv icd implant  08/16/11    SJM BiV ICD implanted by Dr Johney Frame  . Cardiac valve replacement    . Esophagogastroduodenoscopy N/A 11/16/2013    Procedure: ESOPHAGOGASTRODUODENOSCOPY (EGD);  Surgeon: Theda Belfast, MD;  [facility] ENDOSCOPY;  Service: Endoscopy;  Laterality: N/A;    Family History  Problem Relation Age of Onset  . Diabetes Mother   . Hypertension Mother   . Heart attack Father   . Heart attack Brother   . Diabetes Brother   . Cancer Sister   . Diabetes Sister   . Hypertension Sister     History   Social History  . Marital Status: Married    Spouse Name: N/A    Number of Children: N/A  . Years of Education: N/A   Occupational History  . retired    Social History Main Topics  . Smoking status: Former Smoker -- 1.50  packs/day for 36 years    Types: Cigarettes    Quit date: 04/12/1992  . Smokeless tobacco: Never Used  . Alcohol Use: Yes     Comment: moderate  . Drug Use: No  . Sexual Activity: Not on file   Other Topics Concern  . Not on file   Social History Narrative  . No narrative on file    Review of systems: He complains of fatigue, but not breathlessness. His edema and jaundice resolved, some degree of abdominal distention persists The patient specifically denies any chest pain at rest or with exertion, dyspnea at rest or with exertion, orthopnea, paroxysmal nocturnal dyspnea, syncope,  palpitations, focal neurological deficits, intermittent claudication, unexplained weight gain, cough, hemoptysis or wheezing.  The patient also denies abdominal pain, nausea, vomiting, dysphagia, diarrhea, constipation, polyuria, polydipsia, dysuria, hematuria, frequency, urgency, abnormal bleeding or bruising, fever, chills, unexpected weight changes, mood swings, change in skin or hair texture, change in voice quality, auditory or visual problems, allergic reactions or rashes, new musculoskeletal complaints other than usual "aches and pains".   PHYSICAL EXAM BP 122/68  Pulse 55  Ht 6\' 1"  (1.854 m)  Wt 226 lb 6.4 oz (102.694 kg)  BMI 29.88 kg/m2 General: Alert, oriented x3, no distress  Head: no evidence of trauma, PERRL, EOMI, no exophtalmos or lid lag, no myxedema, no xanthelasma; normal ears, nose and oropharynx  Neck: normal jugular venous pulsations and no hepatojugular reflux; brisk carotid pulses without delay and no carotid bruits  Chest: clear to auscultation, no signs of consolidation by percussion or palpation, normal fremitus, symmetrical and full respiratory excursions; sternotomy scar; healed subclavian pacemaker site  Cardiovascular: Laterally displaced, effaced apical impulse, regular rhythm, normal first and second heart sounds, no murmurs, rubs or gallops  Abdomen: no tenderness, still  distended (not sure if this is ascites or obesity), no masses by palpation, no abnormal pulsatility or arterial bruits, normal bowel sounds, no hepatosplenomegaly  Extremities: no clubbing, cyanosis or edema; 2+ radial, ulnar and brachial pulses bilaterally; 2+ right femoral, posterior tibial and dorsalis pedis pulses; 2+ left femoral, posterior tibial and dorsalis pedis pulses; no subclavian or femoral bruits  Neurological: grossly nonfocal   BMET    Component Value Date/Time   NA 140 11/25/2013 0500   K 4.1 11/25/2013 0500   CL 97 11/25/2013 0500   CO2 31 11/25/2013 0500   GLUCOSE 109* 11/25/2013 0500   BUN 30* 11/25/2013 0500   CREATININE 1.31 11/25/2013 0500   CREATININE 1.64* 09/12/2012 1510   CALCIUM 9.6 11/25/2013 0500   GFRNONAA 53* 11/25/2013 0500   GFRNONAA 40* 09/08/2012 1545   GFRAA 62* 11/25/2013 0500   GFRAA 47* 09/08/2012 1545     ASSESSMENT AND PLAN Chronic systolic CHF (congestive heart failure) - stage D He has NYHA functional class 3 despite the fact that he is appears clinically euvolemic (confirmed by cardiac catheterization). As before, it's quite difficult to distinguish how much of his dyspnea is related to his lung problems and how much to his cardiac problem.  Monitor weight daily, try to avoid anything more than one or 2 pounds over today's weight. Avoid sodium rich foods or added salt.  Biventricular implantable cardioverter-defibrillator in situ  CRT-D dual-chamber St. Jude Quadra Assura implanted in May 2013.  No evidence of response to CRT-D. Normal device function by recent testing. He is enrolled in the [facility] remote followup system.  Ventricular tachycardia/ventricular fibrillation therapies disabled - palliative care.  Atrial fibrillation, persistent Unfortunately he is not a great candidate for anticoagulation due to both liver and kidney abnormalities and recent severe unexplained anemia. For similar reasons, there is no appropriate antiarrhythmic. The  arrhythmia clearly interferes with the efficiency of biventricular pacing, but he never really responded to resynchronization anyway. Heart failure exacerbation was not triggered by the arrhythmia. There was evidence of hypervolemia for weeks before the atrial fibrillation began.  All told, the likelihood of long-term maintenance of normal rhythm is low, the potential benefits are questionable and the risk of anticoagulation to allow cardioversion is high. We'll focus on rate control.  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 Laneta Simmers. Cardiac catheterization January 2013 showed ostial stenosis of the saphenous vein graft bypass to the right coronary system but good distal flow. His cardiomyopathy could not be explained based on his coronary abnormality.   Restrictive lung disease  The cause for this is uncertain.   Possible cirrhosis Additional liver dysfunction during episodes of heart failure exacerbation explain his recent jaundice and encephalopathy. Seems much better today.  Per Dr. 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 Avoid all alcohol use  Orders Placed This Encounter  Procedures  . Implantable device check   Meds ordered this encounter  Medications  . PROAIR HFA 108 (90 BASE) MCG/ACT inhaler    Sig: as needed.    Junious Silk, MD, [facility] [phone] office [phone] pager

## 2013-12-11 NOTE — Patient Instructions (Signed)
Your physician has recommended you make the following change in your medication: TAKE FOLIC ACID 1 MG DAILY (OTC)  Your physician recommends that you schedule a follow-up appointment in: 3 months with Dr.Croitoru + ICD check

## 2013-12-13 ENCOUNTER — Telehealth: Payer: Self-pay | Admitting: *Deleted

## 2013-12-13 NOTE — Telephone Encounter (Signed)
Signed order for Metolazone faxed.

## 2013-12-13 NOTE — Telephone Encounter (Signed)
SN order signed and faxed to [facility].

## 2013-12-13 NOTE — Telephone Encounter (Signed)
SN order signed and faxed to [facility].

## 2013-12-18 ENCOUNTER — Encounter: Payer: Commercial Managed Care - HMO | Admitting: Cardiovascular Disease

## 2013-12-21 ENCOUNTER — Telehealth: Payer: Self-pay | Admitting: *Deleted

## 2013-12-21 NOTE — Telephone Encounter (Signed)
Signed med orders faxed to [facility].

## 2013-12-24 ENCOUNTER — Encounter: Payer: Self-pay | Admitting: Cardiovascular Disease

## 2013-12-27 ENCOUNTER — Telehealth: Payer: Self-pay | Admitting: *Deleted

## 2013-12-27 NOTE — Telephone Encounter (Signed)
Signed order for Folic Acid daily.

## 2014-01-09 ENCOUNTER — Encounter: Payer: Self-pay | Admitting: Cardiovascular Disease

## 2014-01-22 ENCOUNTER — Telehealth: Payer: Self-pay | Admitting: *Deleted

## 2014-01-22 NOTE — Telephone Encounter (Signed)
Signed order for [facility] faxed 01/21/14.

## 2014-01-23 ENCOUNTER — Telehealth: Payer: Self-pay | Admitting: Cardiovascular Disease

## 2014-01-23 NOTE — Telephone Encounter (Signed)
Called patient's wife with medication update. Instructed to her increase patient's metolazone to 5mg  daily.   Left VM for Inetta Fermo to return call.

## 2014-01-23 NOTE — Telephone Encounter (Signed)
Spoke with hospice nurse. Patient has increased abdominal ascites and increased shortness of breath. He has taken metolazone 2.5mg  for past 2-3 days prior to AM dose of lasix. He has a hacky cough for about 1 week - he had previously and it preceded a heart failure hospitalization? She states she explained how to use the liquid morphine, which provided the patient some relief. No other complaints  Will defer to Dr. Royann Shivers to advise

## 2014-01-23 NOTE — Telephone Encounter (Signed)
Please increase metolazone to 5 mg daily

## 2014-01-24 ENCOUNTER — Encounter: Payer: Self-pay | Admitting: *Deleted

## 2014-01-24 NOTE — Telephone Encounter (Signed)
Faxed order for metolazone 5mg  QD 30 mins prior to AM lasix dose to [facility] [phone] Inetta Fermo was notified of med change

## 2014-02-08 ENCOUNTER — Telehealth: Payer: Self-pay | Admitting: *Deleted

## 2014-02-08 NOTE — Telephone Encounter (Signed)
Order for metolazone faxed to [facility].

## 2014-02-14 ENCOUNTER — Telehealth: Payer: Self-pay | Admitting: Cardiovascular Disease

## 2014-02-14 DIAGNOSIS — F05 Delirium due to known physiological condition: Secondary | ICD-10-CM

## 2014-02-14 DIAGNOSIS — R6889 Other general symptoms and signs: Secondary | ICD-10-CM

## 2014-02-14 NOTE — Telephone Encounter (Signed)
Will defer to Dr Jens Som ( D.O.D.) Dr Royann Shivers is out of the office

## 2014-02-14 NOTE — Telephone Encounter (Signed)
[name] called in stating the pt has been having some increased confusion and she thinks that it is due to his amonia levels not building up. She would l;ike to know if it is ok for her to call him in some Lactulose. Please call  Thanks

## 2014-02-14 NOTE — Telephone Encounter (Signed)
Check amonia level Olga Millers

## 2014-02-14 NOTE — Telephone Encounter (Signed)
No Answer/Busy - called (11/5) and the automated message stated that the voicemail box had not yet been set up. Will call tomorrow.

## 2014-02-15 NOTE — Telephone Encounter (Signed)
Spoke with [name] with [facility]. She was following up on triage call from 11/5. Informed of advice of Dr. Jens Som to have ammonia level drawn in order to assess need for lactulose.   [name] states she cannot draw ammonia level at home. She asked if patient could be seen in office. RN inquired of patient's symptoms - [name] reports confusion/forgetfulness and some increased SOB, but no other cardiac complaints. Informed [name] I would reach out to patient/wife to obtain more information and offer to order ammonia lab to be drawn at [facility] in [place] or if they felt patient needed OV. [name] states patient became confused prior to last hospitalization for heart failure, after which hospice started working with him.   Called patient/wife. Both asleep according to person answering phone. Asked that they call back.

## 2014-02-15 NOTE — Telephone Encounter (Signed)
Spoke with patient's wife. She reports he is having the same signs that preceded his last hospitalization - can't walk as good, breathing is worse, increased confusion, repetitive questions, ?hallucinations (she states she told her she had a black spot on her shirt when she did not). She says Inetta Fermo, [facility] nurse, said his lungs sounded clear and heart sounds were good.   Advised that if she is concerned with his symptoms she should take him to hospital for eval.   She would prefer to have blood test to check his ammonia levels on Monday at [place] - offered [facility] in [place] that has Saturday hours, but was declined.   Ammonia level ordered STAT and she was given the number for [facility] in [place] to call on Monday to inquire of nearest location.

## 2014-02-15 NOTE — Telephone Encounter (Signed)
LMTCB

## 2014-02-15 NOTE — Telephone Encounter (Signed)
No answer on wife's mobile number. LMTCB on home number.   Not able to reach [place] with Hospice - no voicemail.

## 2014-02-18 ENCOUNTER — Telehealth: Payer: Self-pay | Admitting: Cardiovascular Disease

## 2014-02-18 ENCOUNTER — Other Ambulatory Visit: Payer: Self-pay

## 2014-02-18 DIAGNOSIS — R319 Hematuria, unspecified: Secondary | ICD-10-CM

## 2014-02-18 NOTE — Telephone Encounter (Signed)
Returned call to patient's wife she stated husband will be having lab work done at [facility] 02/19/14.Stated she would like him to have a urinalysis too.Stated he has noticed blood in urine pinkish in color no bright red.Urinalysis ordered.Message sent to Dr.Croitoru for review.

## 2014-02-18 NOTE — Telephone Encounter (Signed)
[name] is calling because her husband was to have lab work done today , because the weather was bad she will go tomorrow and she have noticed some blood in his urine . Please call   Thanks

## 2014-02-22 ENCOUNTER — Other Ambulatory Visit: Payer: Self-pay | Admitting: Cardiovascular Disease

## 2014-02-22 ENCOUNTER — Telehealth: Payer: Self-pay | Admitting: Cardiovascular Disease

## 2014-02-22 DIAGNOSIS — R0602 Shortness of breath: Secondary | ICD-10-CM

## 2014-02-22 DIAGNOSIS — Z79899 Other long term (current) drug therapy: Secondary | ICD-10-CM

## 2014-02-22 NOTE — Telephone Encounter (Signed)
Took wife 2 days to find the [facility] in [place].  Will take him to the [facility] location today.  Orders for U/A and Amonia printed and faxed also order BNP & BMP ( since it is difficult for him to get out) due to continued swelling per wife. Fax # [phone]

## 2014-02-22 NOTE — Telephone Encounter (Signed)
Please call,concerning his lab order. It was suppose to have been sent to [facility],it is not there.Marland Kitchen

## 2014-02-23 LAB — BASIC METABOLIC PANEL
BUN/Creatinine Ratio: 20 (ref 10–22)
BUN: 24 mg/dL (ref 8–27)
CALCIUM: 9.8 mg/dL (ref 8.6–10.2)
CHLORIDE: 93 mmol/L — AB (ref 97–108)
CO2: 28 mmol/L (ref 18–29)
Creatinine, Ser: 1.23 mg/dL (ref 0.76–1.27)
GFR calc Af Amer: 68 mL/min/{1.73_m2} (ref >59)
GFR, EST NON AFRICAN AMERICAN: 59 mL/min/{1.73_m2} — AB (ref >59)
Glucose: 119 mg/dL — ABNORMAL HIGH (ref 65–99)
POTASSIUM: 4.4 mmol/L (ref 3.5–5.2)
SODIUM: 136 mmol/L (ref 134–144)

## 2014-02-23 LAB — URINALYSIS
BILIRUBIN UA: NEGATIVE
Glucose, UA: NEGATIVE
Ketones, UA: NEGATIVE
Leukocytes, UA: NEGATIVE
NITRITE UA: NEGATIVE
Protein, UA: NEGATIVE
RBC UA: NEGATIVE
Specific Gravity, UA: 1.008 (ref 1.005–1.030)
UUROB: 0.2 mg/dL (ref 0.2–1.0)
pH, UA: 7 (ref 5.0–7.5)

## 2014-02-23 LAB — AMMONIA: AMMONIA: 40 ug/dL (ref 27–102)

## 2014-02-23 LAB — BRAIN NATRIURETIC PEPTIDE: BNP: 159.1 pg/mL — ABNORMAL HIGH (ref 0.0–100.0)

## 2014-02-25 ENCOUNTER — Telehealth: Payer: Self-pay | Admitting: *Deleted

## 2014-02-25 MED ORDER — LACTULOSE 20 GM/30ML PO SOLN: 30.0000 mL | Freq: Three times a day (TID) | ORAL | Status: DC

## 2014-02-25 NOTE — Telephone Encounter (Signed)
-----   Message from Thurmon Fair, MD sent at 02/25/2014  8:48 AM EST ----- Ammonia moderately high. [name], is he taking lactulose? If not, start lactulose 30 mL Q8h - goal is 2-3 soft stools per day to help with confusion from hepatic encephalopathy

## 2014-02-25 NOTE — Telephone Encounter (Signed)
Lab results called to patient's wife.  He is not take lactulose.  Rx sent to pharmacy and wife will start 57ml Q8h.

## 2014-02-26 ENCOUNTER — Telehealth: Payer: Self-pay | Admitting: Cardiovascular Disease

## 2014-02-26 NOTE — Telephone Encounter (Signed)
Received call from Jamel at [facility] he wanted to get ICD codes for bmet.I48.4 and I50.22 given.

## 2014-03-06 ENCOUNTER — Other Ambulatory Visit: Payer: Self-pay | Admitting: Cardiovascular Disease

## 2014-03-06 NOTE — Telephone Encounter (Signed)
Rx refill sent to patient pharmacy   

## 2014-03-12 ENCOUNTER — Ambulatory Visit (INDEPENDENT_AMBULATORY_CARE_PROVIDER_SITE_OTHER): Payer: Medicare HMO | Admitting: Cardiovascular Disease

## 2014-03-12 ENCOUNTER — Encounter: Payer: Self-pay | Admitting: Cardiovascular Disease

## 2014-03-12 VITALS — BP 106/70 | HR 74 | Resp 16 | Ht 73.0 in | Wt 238.2 lb

## 2014-03-12 DIAGNOSIS — I4819 Other persistent atrial fibrillation: Secondary | ICD-10-CM

## 2014-03-12 DIAGNOSIS — I447 Left bundle-branch block, unspecified: Secondary | ICD-10-CM

## 2014-03-12 DIAGNOSIS — I25810 Atherosclerosis of coronary artery bypass graft(s) without angina pectoris: Secondary | ICD-10-CM

## 2014-03-12 DIAGNOSIS — I481 Persistent atrial fibrillation: Secondary | ICD-10-CM

## 2014-03-12 DIAGNOSIS — Z95810 Presence of automatic (implantable) cardiac defibrillator: Secondary | ICD-10-CM

## 2014-03-12 DIAGNOSIS — I5022 Chronic systolic (congestive) heart failure: Secondary | ICD-10-CM

## 2014-03-12 DIAGNOSIS — I5043 Acute on chronic combined systolic (congestive) and diastolic (congestive) heart failure: Secondary | ICD-10-CM

## 2014-03-12 LAB — MDC_IDC_ENUM_SESS_TYPE_INCLINIC
Brady Statistic RA Percent Paced: 0 %
Brady Statistic RV Percent Paced: 64 %
Date Time Interrogation Session: 20151201180514
HighPow Impedance: 63 Ohm
Implantable Pulse Generator Serial Number: 7037237
Lead Channel Impedance Value: 1025 Ohm
Lead Channel Impedance Value: 337.5 Ohm
Lead Channel Impedance Value: 400 Ohm
Lead Channel Pacing Threshold Amplitude: 1 V
Lead Channel Pacing Threshold Amplitude: 2.75 V
Lead Channel Pacing Threshold Pulse Width: 0.5 ms
Lead Channel Pacing Threshold Pulse Width: 0.8 ms
Lead Channel Sensing Intrinsic Amplitude: 10.4 mV
Lead Channel Sensing Intrinsic Amplitude: 2.4 mV
Lead Channel Setting Pacing Amplitude: 1.5 V
Lead Channel Setting Pacing Amplitude: 2 V
Lead Channel Setting Pacing Amplitude: 4 V
MDC IDC MSMT BATTERY REMAINING LONGEVITY: 46.8 mo
MDC IDC MSMT LEADCHNL LV PACING THRESHOLD AMPLITUDE: 2.75 V
MDC IDC MSMT LEADCHNL LV PACING THRESHOLD PULSEWIDTH: 0.8 ms
MDC IDC SET LEADCHNL LV PACING PULSEWIDTH: 0.8 ms
MDC IDC SET LEADCHNL RV PACING PULSEWIDTH: 0.5 ms
MDC IDC SET LEADCHNL RV SENSING SENSITIVITY: 0.5 mV
Zone Setting Detection Interval: 300 ms
Zone Setting Detection Interval: 340 ms

## 2014-03-12 MED ORDER — LACTULOSE 20 GM/30ML PO SOLN: 30.0000 mL | Freq: Three times a day (TID) | ORAL | Status: AC

## 2014-03-12 NOTE — Patient Instructions (Signed)
Dr. Croitoru recommends that you schedule a follow-up appointment in: 3 months with device check.   

## 2014-03-13 ENCOUNTER — Encounter: Payer: Self-pay | Admitting: Cardiovascular Disease

## 2014-03-13 NOTE — Progress Notes (Signed)
Patient ID: [name], male   DOB: 11-21-1940, 73 y.o.   MRN: [id number]     Reason for office visit Advanced heart failure for heart disease and coronary artery disease  [name] is now receiving palliative care for advanced heart failure. He was hospitalized with severe hypervolemia and manifestations of biventricular heart failure in the setting of profound anemia. He is in persistent atrial fibrillation, but the particular decompensation preceded the arrhythmia by several weeks. He also appears to have cirrhosis, at least by imaging studies and EGD findings of portal gastropathy. Retrospectively, cirrhosis could explain the difficulty we have had in alleviating his symptoms over the last 2-3 years.  I think overall he is actually doing better since we refrained from aggressive cardiac treatments. He is definitely in better spirits. He has intermittent problems with confusion consistent with hepatic encephalopathy. This was particularly apparent after a large Thanksgiving meal. Interrogation of his device also shows a drop in thoracic impedance that occurred around the time of Thanksgiving, but which appears to be spontaneously correcting over the last 24-48 hours.  He is not jaundiced and does not appear pale. He has no signs or respiratory difficulty. His weight was 224 pounds at hospital discharge and he weighs 14 pounds more today. He weighed 248 pounds at the time of his admission. Discharge creatinine was 1.3, pro BNP 1322 (at admission 3747), hemoglobin 9.4 (admission 6.3).  ICD tachycardia therapies were turned "off" at the time of hospital discharge.  [name] has extensive history of structural heart disease including severely depressed left ventricular systolic function, coronary artery disease and heart disease. He has had 2 separate aortic valve replacement procedures (2002, 2009) most recently a root replacement with a Medtronic freestyle root conduits and coronary  reimplantation. He received a CRT-D device, but did not appear to respond, even after AV optimization. He has persistent and severe shortness of breath with NYHA functional class 3-4 status persistently, despite aggressive medical therapy.  Coronary angiography in January of 2013 showed a 80% stenosis of the ostium of the saphenous vein graft to the right coronary artery that could not explain the severity of his cardiomyopathy. Lung function testing was then performed and shows evidence of moderate restrictive lung disease. His forced vital capacity is about 50% of normal and there is a commensurate reduction in his FEV1 and diffusion capacity. The total lung capacity is less severely reduced at about 72% of predicted. He used to work in a [employer] and in [employer] but has not had a history of significant occupational exposure to asbestos or other causes of occupational lung disease.  Abdominal ultrasound in August 2015 showed findings suggestive of "early cirrhosis". He also had ascites. EGD (Dr. Elnoria Howard) showed 1)Portal HTN gastropathy and no clear evidence of any bleeding source. 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    Allergies  Allergen Reactions  . Ace Inhibitors Cough    Current Outpatient Prescriptions  Medication Sig Dispense Refill  . allopurinol (ZYLOPRIM) 300 MG tablet Take 300 mg by mouth daily.      Marland Kitchen ALPRAZolam (XANAX) 0.5 MG tablet Take 1 tablet (0.5 mg total) by mouth at bedtime as needed for anxiety or sleep. 30 tablet 0  . carvedilol (COREG) 6.25 MG tablet TAKE 1 TABLET BY MOUTH TWICE A DAY 30 tablet 5  . clopidogrel (PLAVIX) 75 MG tablet Take 1 tablet (75 mg total) by mouth daily. 30 tablet 5  . cyanocobalamin 100 MCG tablet Take 100 mcg by mouth daily.    Marland Kitchen  famotidine (PEPCID) 20 MG tablet Take 20 mg by mouth at bedtime.    . furosemide (LASIX) 80 MG tablet Take 1 tablet (80 mg total) by mouth 2 (two) times daily. 60 tablet 11  . Lactulose 20 GM/30ML SOLN Take 30 mLs (20 g  total) by mouth every 8 (eight) hours. 946 mL 6  . losartan (COZAAR) 25 MG tablet Take 1 tablet (25 mg total) by mouth daily. 30 tablet 11  . metolazone (ZAROXOLYN) 2.5 MG tablet Take 5 mg by mouth daily.    . Morphine Sulfate (MORPHINE CONCENTRATE) 10 mg / 0.5 ml concentrated solution Take 0.25 mLs (5 mg total) by mouth every 2 (two) hours as needed for moderate pain, severe pain, anxiety or shortness of breath. 30 mL 0  . potassium chloride SA (K-DUR,KLOR-CON) 20 MEQ tablet Take 2 tablets (40 mEq total) by mouth daily. 30 tablet 11  . PROAIR HFA 108 (90 BASE) MCG/ACT inhaler as needed.    Marland Kitchen atorvastatin (LIPITOR) 40 MG tablet Take 1 tablet by mouth daily.    . pantoprazole (PROTONIX) 40 MG tablet Take 40 mg by mouth daily.  5   No current facility-administered medications for this visit.    Past Medical History  Diagnosis Date  . Nonischemic cardiomyopathy     EF 25-30%  . Valvular cardiomyopathy   . Aortic valve replaced     x2 2002 & 2009 Medtronic free style aortic root  . CAD (coronary artery disease)     s/p CABG  . LBBB (left bundle branch block)     QRS  . CHF (congestive heart failure)   . Hypertension   . Acute blood loss anemia 11/14/2013  . COPD (chronic obstructive pulmonary disease) 11/24/2013    Past Surgical History  Procedure Laterality Date  . Aortic valve replacement      s/p 2 separate AVR procedures, an initial mechanical AVR was replaced with a Medtronic Freestyle root in 2009 with reimplantation of the cors as well as redo SVG to RCA and preservation of LIMA to LAD  . Coronary artery bypass graft      2002  . Biv icd implant  08/16/11    SJM BiV ICD implanted by Dr Johney Frame  . Cardiac valve replacement    . Esophagogastroduodenoscopy N/A 11/16/2013    Procedure: ESOPHAGOGASTRODUODENOSCOPY (EGD);  Surgeon: Theda Belfast, MD;  Location: [facility] ENDOSCOPY;  Service: Endoscopy;  Laterality: N/A;    Family History  Problem Relation Age of Onset  . Diabetes  Mother   . Hypertension Mother   . Heart attack Father   . Heart attack Brother   . Diabetes Brother   . Cancer Sister   . Diabetes Sister   . Hypertension Sister     History   Social History  . Marital Status: Married    Spouse Name: N/A    Number of Children: N/A  . Years of Education: N/A   Occupational History  . retired    Social History Main Topics  . Smoking status: Former Smoker -- 1.50 packs/day for 36 years    Types: Cigarettes    Quit date: 04/12/1992  . Smokeless tobacco: Never Used  . Alcohol Use: Yes     Comment: moderate  . Drug Use: No  . Sexual Activity: Not on file   Other Topics Concern  . Not on file   Social History Narrative    Review of systems: Intermittent daytime sleepiness and confusion, probable hepatic encephalopathy. He has  mild abdominal distention and lower extremity edema He has chronic shortness of breath NYHA functional class 2. He denies angina, orthopnea, paroxysmal nocturnal dyspnea, syncope, palpitations, focal neurological deficits. He denies unexplained weight gain, cough, hemoptysis or wheezing.  The patient also denies abdominal pain, nausea, vomiting, dysphagia, diarrhea, constipation, polyuria, polydipsia, dysuria, hematuria, frequency, urgency, abnormal bleeding or bruising, fever, chills, unexpected weight changes, mood swings, change in skin or hair texture, change in voice quality, auditory or visual problems, allergic reactions or rashes, new musculoskeletal complaints other than usual "aches and pains".  PHYSICAL EXAM BP 106/70 mmHg  Pulse 74  Resp 16  Ht 6\' 1"  (1.854 m)  Wt 238 lb 3.2 oz (108.047 kg)  BMI 31.43 kg/m2 General: Alert, oriented x3, no distress  Head: no evidence of trauma, PERRL, EOMI, no exophtalmos or lid lag, no myxedema, no xanthelasma; normal ears, nose and oropharynx  Neck: normal jugular venous pulsations and no hepatojugular reflux; brisk carotid pulses without delay and no carotid bruits   Chest: clear to auscultation, no signs of consolidation by percussion or palpation, normal fremitus, symmetrical and full respiratory excursions; sternotomy scar; healed subclavian pacemaker site  Cardiovascular: Laterally displaced, effaced apical impulse, regular rhythm, normal first and second heart sounds, no murmurs, rubs or gallops  Abdomen: Mild distention, possibly ascites, no masses by palpation, no abnormal pulsatility or arterial bruits, normal bowel sounds, no hepatosplenomegaly  Extremities: no clubbing, cyanosis, 1-2+ ankle edema; 2+ radial, ulnar and brachial pulses bilaterally; 2+ right femoral, posterior tibial and dorsalis pedis pulses; 2+ left femoral, posterior tibial and dorsalis pedis pulses; no subclavian or femoral bruits  Neurological: grossly nonfocal  BMET    Component Value Date/Time   NA 136 02/22/2014 1544   NA 140 11/25/2013 0500   K 4.4 02/22/2014 1544   CL 93* 02/22/2014 1544   CO2 28 02/22/2014 1544   GLUCOSE 119* 02/22/2014 1544   GLUCOSE 109* 11/25/2013 0500   BUN 24 02/22/2014 1544   BUN 30* 11/25/2013 0500   CREATININE 1.23 02/22/2014 1544   CREATININE 1.64* 09/12/2012 1510   CALCIUM 9.8 02/22/2014 1544   GFRNONAA 59* 02/22/2014 1544   GFRNONAA 40* 09/08/2012 1545   GFRAA 68 02/22/2014 1544   GFRAA 47* 09/08/2012 1545     ASSESSMENT AND PLAN  Chronic systolic CHF (congestive heart failure) He has a mild acute exacerbation of his chronic heart failure/hypervolemia. This is supported by his increased weight and reduction in thoracic impedance, but he is not particularly symptomatic in the thoracic impedance seems to be improving. It was likely triggered by increased salt intake during the holiday. Aggressive diuretic therapy could lead to worsening encephalopathy. I think focusing on salt restriction again may solve the problem without adjusting his medicines. Lactulose is helping.  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 Laneta SimmersBartle. Cardiac catheterization January 2013 showed 80% ostial stenosis of the saphenous vein graft bypass to the right coronary system but good distal flow. He does not have angina pectoris. Focusing on palliation of symptoms. He does not have angina pectoris  Biventricular implantable cardioverter-defibrillator in situ He never really appear to be a CRT responder. Currently he only has 64% biventricular pacing and the degree of by V pacing has shown no association with heart failure signs or symptoms or his thoracic impedance. We might  even consider turning biventricular pacing off. His tachyarrhythmia detection and therapies are disabled. He has a DO NOT RESUSCITATE status.  Persistent atrial fibrillation This is the cause of his poor by V pacing but I don't think there is any chance he will return to sustained normal rhythm. He is not a candidate for anticoagulation due to his liver disease. Rate control appears to be satisfactory. His heart rate is less than 100 bpm virtually 100% of the time.  Patient Instructions  Dr. Royann Shivers recommends that you schedule a follow-up appointment in: 3 months with device check.      Orders Placed This Encounter  Procedures  . Implantable device check   Meds ordered this encounter  Medications  . atorvastatin (LIPITOR) 40 MG tablet    Sig: Take 1 tablet by mouth daily.  . pantoprazole (PROTONIX) 40 MG tablet    Sig: Take 40 mg by mouth daily.    Refill:  5  . Lactulose 20 GM/30ML SOLN    Sig: Take 30 mLs (20 g total) by mouth every 8 (eight) hours.    Dispense:  946 mL    Refill:  6    Hicks Feick  Thurmon Fair, MD, [facility] [phone] office [phone] pager

## 2014-03-19 ENCOUNTER — Other Ambulatory Visit: Payer: Self-pay | Admitting: Cardiovascular Disease

## 2014-03-20 ENCOUNTER — Other Ambulatory Visit: Payer: Self-pay | Admitting: Cardiovascular Disease

## 2014-03-21 ENCOUNTER — Encounter (HOSPITAL_COMMUNITY): Payer: Self-pay | Admitting: Cardiovascular Disease

## 2014-03-22 ENCOUNTER — Telehealth: Payer: Self-pay | Admitting: *Deleted

## 2014-03-22 NOTE — Telephone Encounter (Signed)
Signed orders for home care faxed to [facility].

## 2014-03-25 ENCOUNTER — Telehealth: Payer: Self-pay | Admitting: *Deleted

## 2014-03-25 NOTE — Telephone Encounter (Signed)
Order faxed for Lactulose 2 times daily - [facility] to [facility]

## 2014-03-26 ENCOUNTER — Other Ambulatory Visit: Payer: Self-pay | Admitting: Cardiovascular Disease

## 2014-04-01 ENCOUNTER — Encounter: Payer: Self-pay | Admitting: Cardiovascular Disease

## 2014-04-10 ENCOUNTER — Telehealth: Payer: Self-pay | Admitting: Cardiovascular Disease

## 2014-04-10 NOTE — Telephone Encounter (Signed)
Morphine is appropriate, but I will not be in the office to sign a physical Rx until after the New Year. If no one is available to sign at the office, please bring to me at the hospital tomorrow.

## 2014-04-10 NOTE — Telephone Encounter (Signed)
[name] RN  (hospice)called stating that Patient is in need of a prescription for orally  Morphine prn shortness of breathe and pain. A prescription need to be faxed to [facility] fax [phone] ( on prescription write "hospice patient") Dr [name] is the patient attending at [facility].    Prescription  Should read    Morphine 20 mg/ml take 0.5 mls every 2 hour as needed for shortness of breathe and/orpain.  30 ml bottle.  RN informed [name] , that Dr [name] was not in office , will defer to him to see how he would like to proceed, also will send to Dr [place]

## 2014-04-11 MED ORDER — MORPHINE SULFATE (CONCENTRATE) 20 MG/ML PO SOLN: ORAL | Status: DC

## 2014-04-11 NOTE — Telephone Encounter (Signed)
Prescription written and printed. Will have signed and will fax

## 2014-04-11 NOTE — Telephone Encounter (Signed)
RN SPOKE TO CINDY,RN RN INFOMRED HAVE PRESCRIPTION SIGNED.  Cindy informed RN,she had doctor for hospice order medication - morphine for patient. written prescription not needed.  RN destroyed prescription

## 2014-05-13 ENCOUNTER — Other Ambulatory Visit: Payer: Self-pay | Admitting: Cardiovascular Disease

## 2014-05-13 NOTE — Telephone Encounter (Signed)
Rx refill sent to patient pharmacy   

## 2014-05-14 ENCOUNTER — Telehealth: Payer: Self-pay | Admitting: *Deleted

## 2014-05-14 NOTE — Telephone Encounter (Signed)
Report sent by [facility] stating patient's BP running lower than normal - 104/58 and patient feel fatigued and sleeping more.  Per Dr. Salena Saner stop the Losartan and monitor.

## 2014-05-21 ENCOUNTER — Telehealth: Payer: Self-pay | Admitting: Cardiology

## 2014-05-21 NOTE — Telephone Encounter (Signed)
-----   Message from Sebastian Ache, [place] sent at 08/31/2013 11:20 AM EDT ----- Regarding: ICM clinic Hey!!!  [facility] wants this patient added into the ICM clinic.    Thanks,  Trudi Ida

## 2014-05-21 NOTE — Telephone Encounter (Signed)
LMOVM for pt to return call in regards to ICM clinic.  

## 2014-05-27 ENCOUNTER — Telehealth: Payer: Self-pay | Admitting: *Deleted

## 2014-05-27 NOTE — Telephone Encounter (Signed)
Signed order for Losartan 25mg  daily faxed to [facility]

## 2014-06-07 ENCOUNTER — Telehealth: Payer: Self-pay | Admitting: *Deleted

## 2014-06-07 NOTE — Telephone Encounter (Signed)
Signed order SN care faxed to [facility].

## 2014-06-11 ENCOUNTER — Ambulatory Visit (INDEPENDENT_AMBULATORY_CARE_PROVIDER_SITE_OTHER): Payer: Medicare HMO | Admitting: Cardiovascular Disease

## 2014-06-11 ENCOUNTER — Encounter: Payer: Self-pay | Admitting: Cardiovascular Disease

## 2014-06-11 VITALS — BP 120/72 | HR 84 | Resp 20 | Ht 73.0 in | Wt 218.1 lb

## 2014-06-11 DIAGNOSIS — Z95810 Presence of automatic (implantable) cardiac defibrillator: Secondary | ICD-10-CM

## 2014-06-11 DIAGNOSIS — K7460 Unspecified cirrhosis of liver: Secondary | ICD-10-CM

## 2014-06-11 DIAGNOSIS — I4819 Other persistent atrial fibrillation: Secondary | ICD-10-CM

## 2014-06-11 DIAGNOSIS — I481 Persistent atrial fibrillation: Secondary | ICD-10-CM

## 2014-06-11 DIAGNOSIS — I5043 Acute on chronic combined systolic (congestive) and diastolic (congestive) heart failure: Secondary | ICD-10-CM

## 2014-06-11 DIAGNOSIS — I25810 Atherosclerosis of coronary artery bypass graft(s) without angina pectoris: Secondary | ICD-10-CM

## 2014-06-11 DIAGNOSIS — I5022 Chronic systolic (congestive) heart failure: Secondary | ICD-10-CM

## 2014-06-11 LAB — MDC_IDC_ENUM_SESS_TYPE_INCLINIC
Brady Statistic RA Percent Paced: 0 %
Brady Statistic RV Percent Paced: 75 %
Date Time Interrogation Session: 20160301164229
HighPow Impedance: 72 Ohm
Implantable Pulse Generator Serial Number: 7037237
Lead Channel Impedance Value: 1012.5 Ohm
Lead Channel Impedance Value: 425 Ohm
Lead Channel Pacing Threshold Amplitude: 3.125 V
Lead Channel Pacing Threshold Pulse Width: 0.5 ms
Lead Channel Pacing Threshold Pulse Width: 0.8 ms
Lead Channel Sensing Intrinsic Amplitude: 2.7 mV
Lead Channel Setting Pacing Amplitude: 1.5 V
Lead Channel Setting Pacing Amplitude: 4.125
Lead Channel Setting Pacing Pulse Width: 0.8 ms
Lead Channel Setting Sensing Sensitivity: 0.5 mV
MDC IDC MSMT BATTERY REMAINING LONGEVITY: 42 mo
MDC IDC MSMT LEADCHNL RA PACING THRESHOLD AMPLITUDE: 0.5 V
MDC IDC MSMT LEADCHNL RA PACING THRESHOLD PULSEWIDTH: 0.5 ms
MDC IDC MSMT LEADCHNL RV IMPEDANCE VALUE: 337.5 Ohm
MDC IDC MSMT LEADCHNL RV PACING THRESHOLD AMPLITUDE: 1 V
MDC IDC MSMT LEADCHNL RV SENSING INTR AMPL: 11.9 mV
MDC IDC SET LEADCHNL RV PACING AMPLITUDE: 2 V
MDC IDC SET LEADCHNL RV PACING PULSEWIDTH: 0.5 ms
Zone Setting Detection Interval: 300 ms
Zone Setting Detection Interval: 340 ms

## 2014-06-11 NOTE — Patient Instructions (Addendum)
Your physician recommends that you schedule a follow-up appointment in: 3 months with Dr.Croitoru + ICD check (St Jude).

## 2014-06-18 ENCOUNTER — Encounter: Payer: Self-pay | Admitting: Cardiovascular Disease

## 2014-06-18 NOTE — Progress Notes (Signed)
Patient ID: [name], male   DOB: 1940-06-30, 74 y.o.   MRN: [id number]     Cardiology Office Note   Date:  06/18/2014   ID:  ROGERIO BOUTELLE, DOB 12-24-1940, MRN [id number]  PCP:  Marcine Matar, MD  Cardiologist:   Thurmon Fair, MD   Chief Complaint  Patient presents with  . ROV 3 months    patient's wife reports intermittent chest pain and shortness of breath       History of Present Illness: [name] is a 74 y.o. male who presents for Advanced heart failure, CRT-D check and coronary artery disease  [name] is doing better on palliative care/hospice than when we were attempting aggressive management. He has had only occasional problems with sleep disturbance and no major encephalopathy symptoms. Loss of CRT due to atrial fibrillation seems to have had little impact. He has stable class III complaints of dyspnea and fatigue. [name] has rarely needed to adjust his diuretics or lactulose dose.  [name] has extensive history of structural heart disease including severely depressed left ventricular systolic function, coronary artery disease and heart disease. He has had 2 separate aortic valve replacement procedures (2002, 2009) most recently a root replacement with a Medtronic freestyle root conduits and coronary reimplantation. He received a CRT-D device, but did not appear to respond, even after AV optimization. He has persistent and severe shortness of breath with NYHA functional class 3-4 status persistently, despite aggressive medical therapy.  Coronary angiography in January of 2013 showed a 80% stenosis of the ostium of the saphenous vein graft to the right coronary artery that could not explain the severity of his cardiomyopathy. Lung function testing was then performed and shows evidence of moderate restrictive lung disease. His forced vital capacity is about 50% of normal and there is a commensurate reduction in his FEV1 and diffusion capacity. The total  lung capacity is less severely reduced at about 72% of predicted. He used to work in a [employer] and in [employer] but has not had a history of significant occupational exposure to asbestos or other causes of occupational lung disease.  Abdominal ultrasound in August 2015 showed findings suggestive of "early cirrhosis". He also had ascites. EGD (Dr. Elnoria Howard) showed 1)Portal HTN gastropathy and no clear evidence of any bleeding source. 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 Past Medical History  Diagnosis Date  . Nonischemic cardiomyopathy     EF 25-30%  . Valvular cardiomyopathy   . Aortic valve replaced     x2 2002 & 2009 Medtronic free style aortic root  . CAD (coronary artery disease)     s/p CABG  . LBBB (left bundle branch block)     QRS  . CHF (congestive heart failure)   . Hypertension   . Acute blood loss anemia 11/14/2013  . COPD (chronic obstructive pulmonary disease) 11/24/2013    Past Surgical History  Procedure Laterality Date  . Aortic valve replacement      s/p 2 separate AVR procedures, an initial mechanical AVR was replaced with a Medtronic Freestyle root in 2009 with reimplantation of the cors as well as redo SVG to RCA and preservation of LIMA to LAD  . Coronary artery bypass graft      2002  . Biv icd implant  08/16/11    SJM BiV ICD implanted by Dr Johney Frame  . Cardiac valve replacement    . Esophagogastroduodenoscopy N/A 11/16/2013    Procedure: ESOPHAGOGASTRODUODENOSCOPY (EGD);  Surgeon: Theda Belfast,  MD;  Location: [facility] ENDOSCOPY;  Service: Endoscopy;  Laterality: N/A;  . Left and right heart catheterization with coronary angiogram N/A 04/27/2011    Procedure: LEFT AND RIGHT HEART CATHETERIZATION WITH CORONARY ANGIOGRAM;  Surgeon: Thurmon Fair, MD;  Location: [facility] CATH LAB;  Service: Cardiovascular;  Laterality: N/A;  . Bi-ventricular implantable cardioverter defibrillator N/A 08/16/2011    Procedure: BI-VENTRICULAR IMPLANTABLE CARDIOVERTER DEFIBRILLATOR  (CRT-D);   Surgeon: Hillis Range, MD;  Location: [facility] CATH LAB;  Service: Cardiovascular;  Laterality: N/A;  . Left and right heart catheterization with coronary/graft angiogram N/A 09/18/2012    Procedure: LEFT AND RIGHT HEART CATHETERIZATION WITH Isabel Caprice;  Surgeon: Thurmon Fair, MD;  Location: [facility] CATH LAB;  Service: Cardiovascular;  Laterality: N/A;     Current Outpatient Prescriptions  Medication Sig Dispense Refill  . allopurinol (ZYLOPRIM) 300 MG tablet Take 300 mg by mouth daily.      Marland Kitchen atorvastatin (LIPITOR) 40 MG tablet Take 1 tablet by mouth daily.    . carvedilol (COREG) 6.25 MG tablet TAKE 1 TABLET BY MOUTH TWICE A DAY 30 tablet 4  . clopidogrel (PLAVIX) 75 MG tablet Take 1 tablet (75 mg total) by mouth daily. 30 tablet 5  . cyanocobalamin 100 MCG tablet Take 100 mcg by mouth daily.    . famotidine (PEPCID) 20 MG tablet Take 20 mg by mouth at bedtime.    . furosemide (LASIX) 80 MG tablet Take 1 tablet (80 mg total) by mouth 2 (two) times daily. 60 tablet 11  . Lactulose 20 GM/30ML SOLN Take 30 mLs (20 g total) by mouth every 8 (eight) hours. 946 mL 6  . metolazone (ZAROXOLYN) 2.5 MG tablet Take 5 mg by mouth daily.    Marland Kitchen morphine (ROXANOL) 20 MG/ML concentrated solution TAKE 0.65mls (10 mg total) by mouth every 2 (two) hours as needed for shortness of breathe and /or moderate to severe pain 30 mL 0  . pantoprazole (PROTONIX) 40 MG tablet TAKE 1 TABLET EVERY DAY 30 tablet 5  . potassium chloride SA (K-DUR,KLOR-CON) 20 MEQ tablet Take 2 tablets (40 mEq total) by mouth daily. 30 tablet 11   No current facility-administered medications for this visit.    Allergies:   Ace inhibitors    Social History:  The patient  reports that he quit smoking about 22 years ago. His smoking use included Cigarettes. He has a 54 pack-year smoking history. He has never used smokeless tobacco. He reports that he drinks alcohol. He reports that he does not use illicit drugs.   Family History:  The  patient's family history includes Cancer in his sister; Diabetes in his brother, mother, and sister; Heart attack in his brother and father; Hypertension in his mother and sister.    ROS:  Please see the history of present illness.     Intermittent daytime sleepiness and confusion, probable hepatic encephalopathy. He has mild abdominal distention and lower extremity edema He has chronic shortness of breath NYHA functional class 2. He denies angina, orthopnea, paroxysmal nocturnal dyspnea, syncope, palpitations, focal neurological deficits. He denies unexplained weight gain, cough, hemoptysis or wheezing.  The patient also denies abdominal pain, nausea, vomiting, dysphagia, diarrhea, constipation, polyuria, polydipsia, dysuria, hematuria, frequency, urgency, abnormal bleeding or bruising, fever, chills, unexpected weight changes, mood swings, change in skin or hair texture, change in voice quality, auditory or visual problems, allergic reactions or rashes, new musculoskeletal complaints other than usual "aches and pains". Otherwise, review of systems are positive for none.   All other  systems are reviewed and negative.    PHYSICAL EXAM: VS:  BP 120/72 mmHg  Pulse 84  Resp 20  Ht  (1.854 m)  Wt 218 lb 1.6 oz (98.93 kg)  BMI 28.78 kg/m2 , BMI Body mass index is 28.78 kg/(m^2). General: Alert, oriented x3, no distress  Head: no evidence of trauma, PERRL, EOMI, no exophtalmos or lid lag, no myxedema, no xanthelasma; normal ears, nose and oropharynx  Neck: normal jugular venous pulsations and no hepatojugular reflux; brisk carotid pulses without delay and no carotid bruits  Chest: clear to auscultation, no signs of consolidation by percussion or palpation, normal fremitus, symmetrical and full respiratory excursions; sternotomy scar; healed subclavian pacemaker site  Cardiovascular: Laterally displaced, effaced apical impulse, regular rhythm, normal first and second heart sounds, no  murmurs, rubs or gallops  Abdomen: Mild distention, possibly ascites, no masses by palpation, no abnormal pulsatility or arterial bruits, normal bowel sounds, no hepatosplenomegaly  Extremities: no clubbing, cyanosis, 1-2+ ankle edema; 2+ radial, ulnar and brachial pulses bilaterally; 2+ right femoral, posterior tibial and dorsalis pedis pulses; 2+ left femoral, posterior tibial and dorsalis pedis pulses; no subclavian or femoral bruits  Neurological: grossly nonfocal Psych: euthymic mood, full affect   EKG:  EKG is ordered today. The ekg ordered today demonstrates atrial fibrillation, LBBB   Recent Labs: 11/12/2013: Magnesium 2.3; TSH 7.070* 11/20/2013: Platelets 273 11/25/2013: ALT 63*; Hemoglobin 9.4*; Pro B Natriuretic peptide (BNP) 1322.0* 02/22/2014: B Natriuretic Peptide 159.1*; BUN 24; Creatinine 1.23; Potassium 4.4; Sodium 136    Lipid Panel No results found for: CHOL, TRIG, HDL, CHOLHDL, VLDL, LDLCALC, LDLDIRECT    Wt Readings from Last 3 Encounters:  06/11/14 218 lb 1.6 oz (98.93 kg)  03/12/14 238 lb 3.2 oz (108.047 kg)  12/11/13 226 lb 6.4 oz (102.694 kg)      Other studies Reviewed:  ICD check shows normal device function. Resolving fall in thoracic impedance, no VT  ASSESSMENT AND PLAN:  Chronic systolic CHF (congestive heart failure) He has a recent and resolving mild acute exacerbation of his chronic heart failure. Aggressive diuretic therapy could lead to worsening encephalopathy. Lactulose is helping.  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 Laneta Simmers. Cardiac catheterization January 2013 showed 80% ostial stenosis of the saphenous vein graft bypass to the right coronary system but good distal flow. He does not have angina pectoris. Focusing on palliation of symptoms.     Biventricular implantable cardioverter-defibrillator in situ He never really appear to be a CRT responder. His tachyarrhythmia detection and therapies are disabled. He has a DO NOT RESUSCITATE status.  Persistent atrial fibrillation This is the cause of his poor by V pacing but I don't think there is any chance he will return to sustained normal rhythm. He is not a candidate for anticoagulation due to his liver disease. Rate control appears to be satisfactory. Current medicines are reviewed at length with the patient today.  The patient does not have concerns regarding medicines.  The following changes have been made:  no change  Labs/ tests ordered today include:  Orders Placed This Encounter  Procedures  . Implantable device check    Patient Instructions  Your physician recommends that you schedule a follow-up appointment in: 3 months with Dr.Agripina Guyette + ICD check (St Jude).      Signed,  Thurmon Fair, MD  06/18/2014 1:39 PM    [facility] [address] [place], [place], [place]  [address] Phone: [phone]; Fax: [phone]

## 2014-06-19 ENCOUNTER — Telehealth: Payer: Self-pay | Admitting: Cardiology

## 2014-06-19 NOTE — Telephone Encounter (Signed)
Spoke w/ pt wife and she agreed to [facility] clinic. 1st transmission 07-18-14

## 2014-06-22 ENCOUNTER — Other Ambulatory Visit: Payer: Self-pay | Admitting: Cardiology

## 2014-06-24 NOTE — Telephone Encounter (Signed)
Rx has been sent to the pharmacy electronically. ° °

## 2014-06-25 ENCOUNTER — Telehealth: Payer: Self-pay | Admitting: *Deleted

## 2014-06-25 MED ORDER — MORPHINE SULFATE (CONCENTRATE) 20 MG/ML PO SOLN: ORAL | Status: AC

## 2014-06-25 NOTE — Telephone Encounter (Signed)
Rx printed for refill of Roxanol awaiting Dr. Renaye Rakers signature.

## 2014-06-26 ENCOUNTER — Telehealth: Payer: Self-pay | Admitting: Cardiovascular Disease

## 2014-06-26 NOTE — Telephone Encounter (Signed)
Tina informed instructed to have Hospice MD to handle pt meds

## 2014-07-01 ENCOUNTER — Encounter: Payer: Self-pay | Admitting: Cardiovascular Disease

## 2014-07-05 ENCOUNTER — Other Ambulatory Visit: Payer: Self-pay | Admitting: Cardiology

## 2014-07-05 NOTE — Telephone Encounter (Signed)
Rx(s) sent to pharmacy electronically.  

## 2014-07-08 ENCOUNTER — Other Ambulatory Visit (HOSPITAL_COMMUNITY): Payer: Self-pay

## 2014-07-08 MED ORDER — METOLAZONE 5 MG PO TABS: 5.0000 mg | ORAL_TABLET | Freq: Every day | ORAL | Status: DC

## 2014-07-10 ENCOUNTER — Other Ambulatory Visit: Payer: Self-pay | Admitting: Cardiovascular Disease

## 2014-07-18 ENCOUNTER — Telehealth: Payer: Self-pay | Admitting: Cardiology

## 2014-07-18 ENCOUNTER — Ambulatory Visit (INDEPENDENT_AMBULATORY_CARE_PROVIDER_SITE_OTHER): Admitting: *Deleted

## 2014-07-18 DIAGNOSIS — I5022 Chronic systolic (congestive) heart failure: Secondary | ICD-10-CM | POA: Diagnosis not present

## 2014-07-18 DIAGNOSIS — Z95810 Presence of automatic (implantable) cardiac defibrillator: Secondary | ICD-10-CM | POA: Diagnosis not present

## 2014-07-18 NOTE — Telephone Encounter (Signed)
Confirmed remote transmission w/ pt wife.   

## 2014-07-22 ENCOUNTER — Telehealth: Payer: Self-pay | Admitting: *Deleted

## 2014-07-22 NOTE — Telephone Encounter (Signed)
ICM transmission received. I left a message for the patient to call. 

## 2014-07-23 ENCOUNTER — Encounter: Payer: Self-pay | Admitting: *Deleted

## 2014-07-23 NOTE — Progress Notes (Signed)
EPIC Encounter for ICM Monitoring  Patient Name: [name] is a 74 y.o. male Date: 07/23/2014 Primary Care Physican: Marcine Matar, MD Primary Cardiologist: Croitoru Electrophysiologist: Croitoru Dry Weight: 218 lbs   Bi-V pacing: 70 % ( was previously 67-72% )      In the past month, have you:  1. Gained more than 2 pounds in a day or more than 5 pounds in a week? Uncertain. He has not weighed recently.  2. Had changes in your medications (with verification of current medications)? no  3. Had more shortness of breath than is usual for you? no  4. Limited your activity because of shortness of breath? no  5. Not been able to sleep because of shortness of breath? no  6. Had increased swelling in your feet or ankles? no  7. Had symptoms of dehydration (dizziness, dry mouth, increased thirst, decreased urine output) no  8. Had changes in sodium restriction? no  9. Been compliant with medication? Yes   ICM trend:   Follow-up plan: ICM clinic phone appointment: 08/26/14. This is my first ICM encounter with the patient. His impedence was slightly below baseline from ~ 3/23-3/31. He denies any change in his symptoms. No changes made today.   Copy of note sent to patient's primary care physician, primary cardiologist, and device following physician.  Sherri Rad, RN, BSN 07/23/2014 5:42 PM

## 2014-07-23 NOTE — Telephone Encounter (Signed)
I spoke with the patient. 

## 2014-08-26 ENCOUNTER — Encounter: Payer: Self-pay | Admitting: *Deleted

## 2014-08-26 ENCOUNTER — Ambulatory Visit (INDEPENDENT_AMBULATORY_CARE_PROVIDER_SITE_OTHER): Admitting: *Deleted

## 2014-08-26 DIAGNOSIS — I5043 Acute on chronic combined systolic (congestive) and diastolic (congestive) heart failure: Secondary | ICD-10-CM | POA: Diagnosis not present

## 2014-08-26 DIAGNOSIS — Z95810 Presence of automatic (implantable) cardiac defibrillator: Secondary | ICD-10-CM | POA: Diagnosis not present

## 2014-08-26 NOTE — Progress Notes (Signed)
EPIC Encounter for ICM Monitoring  Patient Name: [name] is a 74 y.o. male Date: 08/26/2014 Primary Care Physican: Marcine Matar, MD Primary Cardiologist: Croitoru Electrophysiologist: Croitoru Dry Weight: 218 lbs  Bi-V pacing: 73 % (previously 70%)       In the past month, have you:  1. Gained more than 2 pounds in a day or more than 5 pounds in a week? no  2. Had changes in your medications (with verification of current medications)? no  3. Had more shortness of breath than is usual for you? no  4. Limited your activity because of shortness of breath? no  5. Not been able to sleep because of shortness of breath? no  6. Had increased swelling in your feet or ankles? no  7. Had symptoms of dehydration (dizziness, dry mouth, increased thirst, decreased urine output) no  8. Had changes in sodium restriction? no  9. Been compliant with medication? Yes   ICM trend:   Follow-up plan: ICM clinic phone appointment: 10/21/14. No changes made today.  Copy of note sent to patient's primary care physician, primary cardiologist, and device following physician.  Sherri Rad, RN, BSN 08/26/2014 4:35 PM

## 2014-09-17 ENCOUNTER — Encounter: Payer: Self-pay | Admitting: Cardiovascular Disease

## 2014-09-17 ENCOUNTER — Ambulatory Visit (INDEPENDENT_AMBULATORY_CARE_PROVIDER_SITE_OTHER): Payer: Medicare HMO | Admitting: Cardiovascular Disease

## 2014-09-17 VITALS — BP 136/60 | HR 72 | Ht 73.0 in | Wt 201.6 lb

## 2014-09-17 DIAGNOSIS — Z95810 Presence of automatic (implantable) cardiac defibrillator: Secondary | ICD-10-CM

## 2014-09-17 DIAGNOSIS — J984 Other disorders of lung: Secondary | ICD-10-CM

## 2014-09-17 DIAGNOSIS — I481 Persistent atrial fibrillation: Secondary | ICD-10-CM

## 2014-09-17 DIAGNOSIS — I25810 Atherosclerosis of coronary artery bypass graft(s) without angina pectoris: Secondary | ICD-10-CM

## 2014-09-17 DIAGNOSIS — I5022 Chronic systolic (congestive) heart failure: Secondary | ICD-10-CM

## 2014-09-17 DIAGNOSIS — I4819 Other persistent atrial fibrillation: Secondary | ICD-10-CM

## 2014-09-17 DIAGNOSIS — Z954 Presence of other heart-valve replacement: Secondary | ICD-10-CM

## 2014-09-17 DIAGNOSIS — I5043 Acute on chronic combined systolic (congestive) and diastolic (congestive) heart failure: Secondary | ICD-10-CM

## 2014-09-17 DIAGNOSIS — K7682 Hepatic encephalopathy: Secondary | ICD-10-CM | POA: Insufficient documentation

## 2014-09-17 DIAGNOSIS — Z952 Presence of prosthetic heart valve: Secondary | ICD-10-CM

## 2014-09-17 DIAGNOSIS — K7460 Unspecified cirrhosis of liver: Secondary | ICD-10-CM

## 2014-09-17 DIAGNOSIS — K7290 Hepatic failure, unspecified without coma: Secondary | ICD-10-CM | POA: Insufficient documentation

## 2014-09-17 LAB — CUP PACEART INCLINIC DEVICE CHECK
Battery Remaining Longevity: 38.4 mo
Brady Statistic RA Percent Paced: 0.01 %
Brady Statistic RV Percent Paced: 74 %
Date Time Interrogation Session: 20160607125956
HighPow Impedance: 84.375
Lead Channel Impedance Value: 1050 Ohm
Lead Channel Impedance Value: 425 Ohm
Lead Channel Pacing Threshold Amplitude: 0.5 V
Lead Channel Pacing Threshold Amplitude: 1.125 V
Lead Channel Pacing Threshold Pulse Width: 0.5 ms
Lead Channel Sensing Intrinsic Amplitude: 11.9 mV
Lead Channel Sensing Intrinsic Amplitude: 2.7 mV
Lead Channel Setting Pacing Amplitude: 2.125
Lead Channel Setting Pacing Pulse Width: 0.8 ms
Lead Channel Setting Sensing Sensitivity: 0.5 mV
MDC IDC MSMT LEADCHNL LV PACING THRESHOLD AMPLITUDE: 3.375 V
MDC IDC MSMT LEADCHNL LV PACING THRESHOLD PULSEWIDTH: 0.8 ms
MDC IDC MSMT LEADCHNL RA IMPEDANCE VALUE: 450 Ohm
MDC IDC MSMT LEADCHNL RV PACING THRESHOLD PULSEWIDTH: 0.5 ms
MDC IDC SET LEADCHNL LV PACING AMPLITUDE: 4.375
MDC IDC SET LEADCHNL RA PACING AMPLITUDE: 1.5 V
MDC IDC SET LEADCHNL RV PACING PULSEWIDTH: 0.5 ms
Pulse Gen Serial Number: 7037237
Zone Setting Detection Interval: 300 ms
Zone Setting Detection Interval: 340 ms

## 2014-09-17 MED ORDER — METOLAZONE 5 MG PO TABS: 5.0000 mg | ORAL_TABLET | ORAL | Status: AC

## 2014-09-17 NOTE — Progress Notes (Signed)
Patient ID: [name], male   DOB: 29-Jul-1940, 74 y.o.   MRN: [id number]     Cardiology Office Note   Date:  09/17/2014   ID:  [name], DOB March 30, 1941, MRN [id number]  PCP:  Marcine Matar, MD  Cardiologist:   Thurmon Fair, MD   Chief Complaint  Patient presents with  . Follow-up    3 months:  wife states he had chest discomfort last PM x 10 minutes.  Patient does not remember.  Occas. SOB - on home O2.  Lightheadedness today.      History of Present Illness: [name] is a 74 y.o. 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 He has a biventricular defibrillator in place, but tachycardia therapies have been turned off and he is receiving home palliative care. Biventricular pacing efficiency is poor due to persistent atrial fibrillation, but this has had little impact on his overall hemodynamic status.  He has lost weight and his oral intake is steadily decreasing. His abdomen is much less distended and he is completely free of edema. He has orthostatic dizziness and this was confirmed with orthostatic vital signs today. Blood pressure laying down was 136/60, sitting up 116/69, standing up 103/57, standing up after 3 minutes 91/55 mmHg. There was no significant change in his heart rate with position change. He has had worsening problems with constipation and has relatively mild symptoms of hepatic encephalopathy (primarily change in sleep-wake cycle with daytime sleepiness and poor short-term memory urine  Last night he had chest discomfort that lasted for about 10 minutes. He looked uncomfortable and groaned a bit. He does not remember the symptoms. This is not a frequent  occurrence.  He has a Designer, industrial/product device that is functioning normally (again with tachycardia detection and therapies turned off). There are 74% biventricular pacing. Thoracic impedance (Corvue) has been steadily increasing suggesting that his volume load is less and less of an issue.  [name] has extensive history of structural heart disease including severely depressed left ventricular systolic function, coronary artery disease and heart disease. He has had 2 separate aortic valve replacement procedures (2002, 2009) most recently a root replacement with a Medtronic freestyle root conduits and coronary reimplantation. He received a CRT-D device, but did not appear to respond, even after AV optimization. He has persistent and severe shortness of breath with NYHA functional class 3-4 status persistently, despite aggressive medical therapy.  Coronary angiography in January of 2013 showed a 80% stenosis of the ostium of the saphenous vein graft to the right coronary artery that could not explain the severity of his cardiomyopathy. Lung function testing was then performed and shows evidence of moderate restrictive lung disease. His forced vital capacity is about 50% of normal and there is a commensurate reduction in his FEV1 and diffusion capacity. The total lung capacity is less severely reduced at about 72% of predicted. He used to work in a [employer] and in [employer] but has not had a history of significant occupational exposure to asbestos or other causes of occupational lung disease.  Abdominal ultrasound in August 2015 showed findings suggestive of "early cirrhosis". He also had ascites. EGD (Dr. Elnoria Howard) showed 1)Portal HTN gastropathy and no clear evidence of any bleeding source. 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   Past Medical History  Diagnosis Date  . Nonischemic cardiomyopathy     EF 25-30%  . Valvular cardiomyopathy   . Aortic valve replaced     x2 2002 & 2009 Medtronic free  style aortic root  . CAD (coronary artery disease)     s/p CABG  . LBBB (left bundle branch block)     QRS  . CHF (congestive heart failure)   . Hypertension   . Acute blood loss anemia 11/14/2013  . COPD (chronic obstructive pulmonary disease) 11/24/2013    Past Surgical History  Procedure Laterality Date  . Aortic valve replacement      s/p 2 separate AVR procedures, an initial mechanical AVR was replaced with a Medtronic Freestyle root in 2009 with reimplantation of the cors as well as redo SVG to RCA and preservation of LIMA to LAD  . Coronary artery bypass graft      2002  . Biv icd implant  08/16/11    SJM BiV ICD implanted by Dr Johney Frame  . Cardiac valve replacement    . Esophagogastroduodenoscopy N/A 11/16/2013    Procedure: ESOPHAGOGASTRODUODENOSCOPY (EGD);  Surgeon: Theda Belfast, MD;  Location: [facility] ENDOSCOPY;  Service: Endoscopy;  Laterality: N/A;  . Left and right heart catheterization with coronary angiogram N/A 04/27/2011    Procedure: LEFT AND RIGHT HEART CATHETERIZATION WITH CORONARY ANGIOGRAM;  Surgeon: Thurmon Fair, MD;  Location: [facility] CATH LAB;  Service: Cardiovascular;  Laterality: N/A;  . Bi-ventricular implantable cardioverter defibrillator N/A 08/16/2011    Procedure: BI-VENTRICULAR IMPLANTABLE CARDIOVERTER DEFIBRILLATOR  (CRT-D);  Surgeon: Hillis Range, MD;  Location: [facility] CATH LAB;  Service: Cardiovascular;  Laterality: N/A;  . Left and right heart catheterization with coronary/graft angiogram N/A 09/18/2012    Procedure: LEFT AND RIGHT HEART CATHETERIZATION WITH Isabel Caprice;  Surgeon: Thurmon Fair, MD;  Location: [facility] CATH LAB;  Service: Cardiovascular;  Laterality: N/A;     Current Outpatient Prescriptions  Medication Sig Dispense Refill  . allopurinol (ZYLOPRIM) 300 MG tablet TAKE 1 TABLET EVERY DAY 30 tablet 5  . atorvastatin (LIPITOR) 40 MG tablet Take 1 tablet by mouth daily.    . carvedilol (COREG) 6.25 MG tablet TAKE 1 TABLET BY MOUTH TWICE A  DAY 30 tablet 4  . clopidogrel (PLAVIX) 75 MG tablet TAKE 1 TABLET BY MOUTH EVERY DAY 30 tablet 5  . famotidine (PEPCID) 20 MG tablet Take 20 mg by mouth at bedtime.    . furosemide (LASIX) 80 MG tablet Take 1 tablet (80 mg total) by mouth 2 (two) times daily. 60 tablet 11  . Lactulose 20 GM/30ML SOLN Take 30 mLs (20 g total) by mouth every 8 (eight) hours. 946 mL 6  . metolazone (ZAROXOLYN) 5 MG tablet Take 1 tablet (5 mg total) by mouth 3 (three) times a week. Monday/Wednesday/Friday only 90 tablet 3  . morphine (ROXANOL) 20 MG/ML concentrated solution TAKE 0.57mls (10 mg total) by mouth every 2 (two) hours as needed for shortness of breathe and /or moderate to severe pain 30 mL 0  . pantoprazole (PROTONIX) 40 MG tablet TAKE 1 TABLET EVERY DAY 30 tablet 5  . potassium chloride SA (KLOR-CON M20) 20 MEQ tablet Take 2 tablets (40 mEq total) by mouth daily. 60 tablet 11   No current facility-administered medications for this visit.    Allergies:   Ace inhibitors    Social History:  The patient  reports that he quit smoking about 22 years ago. His smoking use  included Cigarettes. He has a 54 pack-year smoking history. He has never used smokeless tobacco. He reports that he drinks alcohol. He reports that he does not use illicit drugs.   Family History:  The patient's family history includes Cancer in his sister; Diabetes in his brother, mother, and sister; Heart attack in his brother and father; Hypertension in his mother and sister.    ROS:  Please see the history of present illness.    Otherwise, review of systems positive for a macular pruritic rash on the left side of his neck and jaw area All other systems are reviewed and negative.    PHYSICAL EXAM: VS:  BP 136/60 mmHg  Pulse 72  Ht 6\' 1"  (1.854 m)  Wt 91.445 kg (201 lb 9.6 oz)  BMI 26.60 kg/m2 , BMI Body mass index is 26.6 kg/(m^2).  General: Alert, oriented x3, no distress  Head: no evidence of trauma, PERRL, EOMI, no  exophtalmos or lid lag, no myxedema, no xanthelasma; normal ears, nose and oropharynx ; macular erythematous rash on the left side of his neck and jaw, might even be tinea Neck: normal jugular venous pulsations and no hepatojugular reflux; brisk carotid pulses without delay and no carotid bruits  Chest: clear to auscultation, no signs of consolidation by percussion or palpation, normal fremitus, symmetrical and full respiratory excursions; sternotomy scar; healed subclavian pacemaker site  Cardiovascular: Laterally displaced, effaced apical impulse, irregular rhythm, normal first and paradoxically split second heart sounds, no murmurs, rubs or gallops  Abdomen: Mild distention, less convincing evidence of ascites, no masses by palpation, no abnormal pulsatility or arterial bruits, normal bowel sounds, no hepatosplenomegaly  Extremities: no clubbing, cyanosis, 1-2+ ankle edema; 2+ radial, ulnar and brachial pulses bilaterally; 2+ right femoral, posterior tibial and dorsalis pedis pulses; 2+ left femoral, posterior tibial and dorsalis pedis pulses; no subclavian or femoral bruits  Neurological: grossly nonfocal Psych: euthymic mood, full affect   EKG:  EKG is not ordered today.    Recent Labs: 11/12/2013: Magnesium 2.3; TSH 7.070* 11/20/2013: Platelets 273 11/25/2013: ALT 63*; Hemoglobin 9.4*; Pro B Natriuretic peptide (BNP) 1322.0* 02/22/2014: B Natriuretic Peptide 159.1*; BUN 24; Creatinine 1.23; Potassium 4.4; Sodium 136    Lipid Panel No results found for: CHOL, TRIG, HDL, CHOLHDL, VLDL, LDLCALC, LDLDIRECT    Wt Readings from Last 3 Encounters:  09/17/14 91.445 kg (201 lb 9.6 oz)  06/11/14 98.93 kg (218 lb 1.6 oz)  03/12/14 108.047 kg (238 lb 3.2 oz)      ASSESSMENT AND PLAN:  Chronic systolic CHF (congestive heart failure) The physical exam findings and thoracic impedance recordings and decreasing weight all suggest that he might be hypovolemic. Excessive diuretic therapy  could lead to worsening encephalopathy. Decrease metolazone to 3 days a week only (Monday Wednesday and Friday) Lactulose can be adjusted to provide 2-3 soft stools daily. I think we need to reevaluate what his "dry weight" might be.  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 Laneta Simmers. Cardiac catheterization January 2013 showed 80% ostial stenosis of the saphenous vein graft bypass to the right coronary system but good distal flow. He may have had angina pectoris last night, but this does not appear to be a common complaint. His memory issues get hard to get a real idea of what the problem was. Focusing on palliation  of symptoms.   Biventricular implantable cardioverter-defibrillator in situ He never really appear to be a CRT responder. His tachyarrhythmia detection and therapies are disabled. He has a DO NOT RESUSCITATE status.  Persistent atrial fibrillation This is the cause of his poor biV pacing but I don't think there is any chance he will return to sustained normal rhythm. He is not a candidate for anticoagulation due to his liver disease. Rate control appears to be satisfactory.   Current medicines are reviewed at length with the patient today. The patient does not have concerns regarding medicines.  The following changes have been made: no change   Labs/ tests ordered today include:  Orders Placed This Encounter  Procedures  . Implantable device check   Patient Instructions  Medication Instructions:   DECREASE metolazone to 3 times a week only.    Follow-Up:  3 months        Signed, Thurmon Fair, MD  09/17/2014 5:45 PM    Thurmon Fair, MD, [facility] [phone] office [phone] pager

## 2014-09-17 NOTE — Patient Instructions (Signed)
Medication Instructions:   DECREASE metolazone to 3 times a week only.    Follow-Up:  3 months

## 2014-10-04 ENCOUNTER — Encounter: Payer: Self-pay | Admitting: Cardiovascular Disease

## 2014-10-21 ENCOUNTER — Ambulatory Visit (INDEPENDENT_AMBULATORY_CARE_PROVIDER_SITE_OTHER): Admitting: *Deleted

## 2014-10-21 DIAGNOSIS — Z95810 Presence of automatic (implantable) cardiac defibrillator: Secondary | ICD-10-CM | POA: Diagnosis not present

## 2014-10-21 DIAGNOSIS — I5022 Chronic systolic (congestive) heart failure: Secondary | ICD-10-CM | POA: Diagnosis not present

## 2014-10-24 ENCOUNTER — Encounter: Payer: Self-pay | Admitting: *Deleted

## 2014-10-24 NOTE — Progress Notes (Signed)
EPIC Encounter for ICM Monitoring  Patient Name: [name] is a 74 y.o. male Date: 10/24/2014 Primary Care Physican: Marcine Matar, MD Primary Cardiologist: Cro[place] Electrophysiologist: Croitoru Dry Weight: 201 lbs  Bi-V pacing: 76% (was previously 73%)       In the past month, have you:  1. Gained more than 2 pounds in a day or more than 5 pounds in a week? No. The patient saw Dr. Royann Shivers on 09/17/14. Per Dr. Erin Hearing report at that time, the patient weight has steadily been declining due to decreased oral intake. His initial baseline weight for me was 218 lbs, but at his office visit he was 201 lbs.He is currently receiving home pallative care and is a DNR. I spoke with the patient's daughter this morning as the patient was currently sleeping. She was aware of his weigh loss, but felt he was fairly stable with his weight at this point.   2. Had changes in your medications (with verification of current medications)? Yes. Dr. Royann Shivers decreased metolazone to MWF at the patient's visit with him on 09/17/14.  3. Had more shortness of breath than is usual for you? Per the patient's daughter, he will complain of some SOB that is not new for him, but will report to the nurse that comes to the home, that he is ok.   4. Limited your activity because of shortness of breath? Per the patient's daughter, he will mainly just go from the bed to the chair.  5. Not been able to sleep because of shortness of breath? no  6. Had increased swelling in your feet or ankles? no  7. Had symptoms of dehydration (dizziness, dry mouth, increased thirst, decreased urine output) no  8. Had changes in sodium restriction? no  9. Been compliant with medication? Yes   ICM trend:   Follow-up plan: ICM clinic phone appointment: 11/25/14. No changes made today.  Copy of note sent to patient's primary care physician, primary cardiologist, and device following physician.  Sherri Rad, RN,  BSN 10/24/2014 9:40 AM

## 2014-10-31 ENCOUNTER — Telehealth: Payer: Self-pay | Admitting: *Deleted

## 2014-10-31 NOTE — Telephone Encounter (Signed)
Fax received from [facility] asking is [name] suppose to be taking Plavix.  Wife is not sure if this was discontinued at last visit.  In review of patients chart it was not stopped.  Contacted wife and she states she doesn't think he missed any doses she had just temporarily misplaced the bottle and has since gotten it refilled.  Hospice notified and will have Dr. Salena Saner sign form and fax back when he returns.

## 2014-11-11 ENCOUNTER — Emergency Department

## 2014-11-11 ENCOUNTER — Encounter: Payer: Self-pay | Admitting: *Deleted

## 2014-11-11 ENCOUNTER — Emergency Department
Admission: EM | Admit: 2014-11-11 | Discharge: 2014-11-11 | Disposition: A | Discharge status: Home / Self Care | Attending: Emergency Medicine | Admitting: Emergency Medicine

## 2014-11-11 DIAGNOSIS — I719 Aortic aneurysm of unspecified site, without rupture: Secondary | ICD-10-CM | POA: Insufficient documentation

## 2014-11-11 DIAGNOSIS — Z87891 Personal history of nicotine dependence: Secondary | ICD-10-CM | POA: Insufficient documentation

## 2014-11-11 DIAGNOSIS — Y998 Other external cause status: Secondary | ICD-10-CM | POA: Diagnosis not present

## 2014-11-11 DIAGNOSIS — Z7901 Long term (current) use of anticoagulants: Secondary | ICD-10-CM | POA: Insufficient documentation

## 2014-11-11 DIAGNOSIS — I10 Essential (primary) hypertension: Secondary | ICD-10-CM | POA: Diagnosis not present

## 2014-11-11 DIAGNOSIS — W01198A Fall on same level from slipping, tripping and stumbling with subsequent striking against other object, initial encounter: Secondary | ICD-10-CM | POA: Insufficient documentation

## 2014-11-11 DIAGNOSIS — S3992XA Unspecified injury of lower back, initial encounter: Secondary | ICD-10-CM | POA: Insufficient documentation

## 2014-11-11 DIAGNOSIS — S22080A Wedge compression fracture of T11-T12 vertebra, initial encounter for closed fracture: Secondary | ICD-10-CM | POA: Insufficient documentation

## 2014-11-11 DIAGNOSIS — Y9289 Other specified places as the place of occurrence of the external cause: Secondary | ICD-10-CM | POA: Insufficient documentation

## 2014-11-11 DIAGNOSIS — S299XXA Unspecified injury of thorax, initial encounter: Secondary | ICD-10-CM | POA: Diagnosis present

## 2014-11-11 DIAGNOSIS — S22089A Unspecified fracture of T11-T12 vertebra, initial encounter for closed fracture: Secondary | ICD-10-CM

## 2014-11-11 DIAGNOSIS — Z79899 Other long term (current) drug therapy: Secondary | ICD-10-CM | POA: Insufficient documentation

## 2014-11-11 DIAGNOSIS — Y9389 Activity, other specified: Secondary | ICD-10-CM | POA: Diagnosis not present

## 2014-11-11 LAB — URINALYSIS COMPLETE WITH MICROSCOPIC (ARMC ONLY)
BACTERIA UA: NONE SEEN
BILIRUBIN URINE: NEGATIVE
Glucose, UA: NEGATIVE mg/dL
Hgb urine dipstick: NEGATIVE
Ketones, ur: NEGATIVE mg/dL
NITRITE: NEGATIVE
Protein, ur: NEGATIVE mg/dL
SPECIFIC GRAVITY, URINE: 1.02 (ref 1.005–1.030)
pH: 5 (ref 5.0–8.0)

## 2014-11-11 LAB — COMPREHENSIVE METABOLIC PANEL
ALT: 11 U/L — AB (ref 17–63)
AST: 20 U/L (ref 15–41)
Albumin: 3.8 g/dL (ref 3.5–5.0)
Alkaline Phosphatase: 83 U/L (ref 38–126)
Anion gap: 13 (ref 5–15)
BUN: 28 mg/dL — AB (ref 6–20)
CO2: 32 mmol/L (ref 22–32)
Calcium: 9.3 mg/dL (ref 8.9–10.3)
Chloride: 86 mmol/L — ABNORMAL LOW (ref 101–111)
Creatinine, Ser: 1.1 mg/dL (ref 0.61–1.24)
GFR calc Af Amer: >60 mL/min (ref >60)
GFR calc non Af Amer: >60 mL/min (ref >60)
Glucose, Bld: 111 mg/dL — ABNORMAL HIGH (ref 65–99)
Potassium: 2.9 mmol/L — CL (ref 3.5–5.1)
Sodium: 131 mmol/L — ABNORMAL LOW (ref 135–145)
Total Bilirubin: 1.1 mg/dL (ref 0.3–1.2)
Total Protein: 7.9 g/dL (ref 6.5–8.1)

## 2014-11-11 LAB — CBC WITH DIFFERENTIAL/PLATELET
BASOS PCT: 1 %
Basophils Absolute: 0.1 10*3/uL (ref 0–0.1)
EOS ABS: 0.1 10*3/uL (ref 0–0.7)
EOS PCT: 1 %
HEMATOCRIT: 36.2 % — AB (ref 40.0–52.0)
HEMOGLOBIN: 11.2 g/dL — AB (ref 13.0–18.0)
LYMPHS ABS: 1.1 10*3/uL (ref 1.0–3.6)
LYMPHS PCT: 9 %
MCH: 21.7 pg — ABNORMAL LOW (ref 26.0–34.0)
MCHC: 30.9 g/dL — ABNORMAL LOW (ref 32.0–36.0)
MCV: 70.1 fL — ABNORMAL LOW (ref 80.0–100.0)
Monocytes Absolute: 1.1 10*3/uL — ABNORMAL HIGH (ref 0.2–1.0)
Monocytes Relative: 9 %
Neutro Abs: 9.6 10*3/uL — ABNORMAL HIGH (ref 1.4–6.5)
Neutrophils Relative %: 80 %
Platelets: 380 10*3/uL (ref 150–440)
RBC: 5.16 MIL/uL (ref 4.40–5.90)
RDW: 19.3 % — ABNORMAL HIGH (ref 11.5–14.5)
WBC: 12 10*3/uL — ABNORMAL HIGH (ref 3.8–10.6)

## 2014-11-11 MED ORDER — HYDROCODONE-ACETAMINOPHEN 5-325 MG PO TABS
1.0000 | ORAL_TABLET | Freq: Once | ORAL | Status: AC
  Administered 2014-11-11: 1 via ORAL
  Filled 2014-11-11: qty 1

## 2014-11-11 MED ORDER — POTASSIUM CHLORIDE CRYS ER 20 MEQ PO TBCR: 40.0000 meq | EXTENDED_RELEASE_TABLET | Freq: Once | ORAL | Status: DC

## 2014-11-11 MED ORDER — IOHEXOL 350 MG/ML SOLN
100.0000 mL | Freq: Once | INTRAVENOUS | Status: AC | PRN
  Administered 2014-11-11: 100 mL via INTRAVENOUS

## 2014-11-11 MED ORDER — HYDROCODONE-ACETAMINOPHEN 5-325 MG PO TABS: 1.0000 | ORAL_TABLET | Freq: Four times a day (QID) | ORAL | Status: AC | PRN

## 2014-11-11 MED ORDER — POTASSIUM CHLORIDE CRYS ER 20 MEQ PO TBCR
40.0000 meq | EXTENDED_RELEASE_TABLET | Freq: Once | ORAL | Status: AC
  Administered 2014-11-11: 40 meq via ORAL
  Filled 2014-11-11: qty 2

## 2014-11-11 NOTE — ED Provider Notes (Signed)
[facility] Emergency Department Provider Note   ____________________________________________  Time seen: 4:35 PM I have reviewed the triage vital signs and the triage nursing note.  HISTORY  Chief Complaint Back Pain and Fall   Historian Patient  HPI [name] is a 74 y.o. male who arrived EMS with a complaint of mid and low back pain since last Wednesday when he fell backwards on to the edge of a coffee table. He had been waiting to see if it would ease off but it has not. He's been having trouble walking due to back pain. He feels like it might be crunching in his back. No hip pain. No head injury. No altered mental status. No numbness or weakness. No urinary symptoms. No bowel incontinence.    Past Medical History  Diagnosis Date  . Nonischemic cardiomyopathy     EF 25-30%  . Valvular cardiomyopathy   . Aortic valve replaced     x2 2002 & 2009 Medtronic free style aortic root  . CAD (coronary artery disease)     s/p CABG  . LBBB (left bundle branch block)     QRS  . CHF (congestive heart failure)   . Hypertension   . Acute blood loss anemia 11/14/2013  . COPD (chronic obstructive pulmonary disease) 11/24/2013    Patient Active Problem List   Diagnosis Date Noted  . Hepatic encephalopathy 09/17/2014  . COPD (chronic obstructive pulmonary disease) 11/24/2013  . Hypokalemia 11/21/2013  . Anemia 11/14/2013  . Atrial fibrillation 11/12/2013  . Other encephalopathy 11/12/2013  . Persistent atrial fibrillation 11/12/2013  . Cirrhosis 11/12/2013  . Acute on chronic systolic and diastolic heart failure, NYHA class 4 11/12/2013  . Breath shortness 11/05/2013  . Dyspnea 11/14/2012  . Restrictive lung disease 11/05/2012  . Aortic valve replaced   . 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 Chronic systolic CHF (congestive heart failure) 08/09/2011  . CAD (coronary artery disease) 08/09/2011  .  LBBB (left bundle branch block) 08/09/2011    Past Surgical History  Procedure Laterality Date  . Aortic valve replacement      s/p 2 separate AVR procedures, an initial mechanical AVR was replaced with a Medtronic Freestyle root in 2009 with reimplantation of the cors as well as redo SVG to RCA and preservation of LIMA to LAD  . Coronary artery bypass graft      2002  . Biv icd implant  08/16/11    SJM BiV ICD implanted by Dr Johney Frame  . Cardiac valve replacement    . Esophagogastroduodenoscopy N/A 11/16/2013    Procedure: ESOPHAGOGASTRODUODENOSCOPY (EGD);  Surgeon: Theda Belfast, MD;  Location: [facility] ENDOSCOPY;  Service: Endoscopy;  Laterality: N/A;  . Left and right heart catheterization with coronary angiogram N/A 04/27/2011    Procedure: LEFT AND RIGHT HEART CATHETERIZATION WITH CORONARY ANGIOGRAM;  Surgeon: Thurmon Fair, MD;  Location: [facility] CATH LAB;  Service: Cardiovascular;  Laterality: N/A;  . Bi-ventricular implantable cardioverter defibrillator N/A 08/16/2011    Procedure: BI-VENTRICULAR IMPLANTABLE CARDIOVERTER DEFIBRILLATOR  (CRT-D);  Surgeon: Hillis Range, MD;  Location: [facility] CATH LAB;  Service: Cardiovascular;  Laterality: N/A;  . Left and right heart catheterization with coronary/graft angiogram N/A 09/18/2012    Procedure: LEFT AND RIGHT HEART CATHETERIZATION WITH Isabel Caprice;  Surgeon: Thurmon Fair, MD;  Location: [facility] CATH LAB;  Service: Cardiovascular;  Laterality: N/A;    Current Outpatient Rx  Name  Route  Sig  Dispense  Refill  .  allopurinol (ZYLOPRIM) 300 MG tablet      TAKE 1 TABLET EVERY DAY   30 tablet   5   . atorvastatin (LIPITOR) 40 MG tablet   Oral   Take 1 tablet by mouth daily.         . carvedilol (COREG) 6.25 MG tablet      TAKE 1 TABLET BY MOUTH TWICE A DAY   30 tablet   4   . clopidogrel (PLAVIX) 75 MG tablet      TAKE 1 TABLET BY MOUTH EVERY DAY   30 tablet   5   . famotidine (PEPCID) 20 MG tablet   Oral   Take 20 mg by mouth at  bedtime.         . furosemide (LASIX) 80 MG tablet   Oral   Take 1 tablet (80 mg total) by mouth 2 (two) times daily.   60 tablet   11   . HYDROcodone-acetaminophen (NORCO/VICODIN) 5-325 MG per tablet   Oral   Take 1-2 tablets by mouth every 6 (six) hours as needed for moderate pain.   10 tablet   0   . Lactulose 20 GM/30ML SOLN   Oral   Take 30 mLs (20 g total) by mouth every 8 (eight) hours.   946 mL   6   . metolazone (ZAROXOLYN) 5 MG tablet   Oral   Take 1 tablet (5 mg total) by mouth 3 (three) times a week. Monday/Wednesday/Friday only   90 tablet   3   . morphine (ROXANOL) 20 MG/ML concentrated solution      TAKE 0.32mls (10 mg total) by mouth every 2 (two) hours as needed for shortness of breathe and /or moderate to severe pain   30 mL   0   . pantoprazole (PROTONIX) 40 MG tablet      TAKE 1 TABLET EVERY DAY   30 tablet   5   . potassium chloride SA (KLOR-CON M20) 20 MEQ tablet   Oral   Take 2 tablets (40 mEq total) by mouth daily.   60 tablet   11     Allergies Ace inhibitors  Family History  Problem Relation Age of Onset  . Diabetes Mother   . Hypertension Mother   . Heart attack Father   . Heart attack Brother   . Diabetes Brother   . Cancer Sister   . Diabetes Sister   . Hypertension Sister     Social History History  Substance Use Topics  . Smoking status: Former Smoker -- 1.50 packs/day for 36 years    Types: Cigarettes    Quit date: 04/12/1992  . Smokeless tobacco: Never Used  . Alcohol Use: Yes     Comment: moderate    Review of Systems  Constitutional: Negative for fever. Eyes: Negative for visual changes. ENT: Negative for sore throat. Cardiovascular: Negative for chest pain. Respiratory: Negative for shortness of breath. Gastrointestinal: Negative for abdominal pain, vomiting and diarrhea. Genitourinary: Negative for dysuria. Musculoskeletal: No extremity traumatic injuries or complaints Skin: Negative for  rash. Neurological: Negative for headaches, focal weakness or numbness. 10 point Review of Systems otherwise negative ____________________________________________   PHYSICAL EXAM:  VITAL SIGNS: ED Triage Vitals  Enc Vitals Group     BP --      Pulse --      Resp --      Temp --      Temp src --      SpO2 --  Weight --      Height --      Head Cir --      Peak Flow --      Pain Score --      Pain Loc --      Pain Edu? --      Excl. in GC? --      Constitutional: Alert and oriented. Well appearing and in no distress. Eyes: Conjunctivae are normal. PERRL. Normal extraocular movements. ENT   Head: Normocephalic and atraumatic.   Nose: No congestion/rhinnorhea.   Mouth/Throat: Mucous membranes are moist.   Neck: No stridor. Cardiovascular/Chest: Normal rate, regular rhythm.  No murmurs, rubs, or gallops. Respiratory: Normal respiratory effort without tachypnea nor retractions. Breath sounds are clear and equal bilaterally. No wheezes/rales/rhonchi. Gastrointestinal: Soft. No distention, no guarding, no rebound. Nontender   Genitourinary/rectal:Deferred Musculoskeletal: Nontender with normal range of motion in all extremities. No joint effusions.  No lower extremity tenderness nor edema. Moderate tenderness in the mid lower T-spine area without step-off. Mild tenderness to the midline lumbar area without step-off. No ecchymosis or abrasions visualized on the back. Neurologic:  Normal speech and language. No gross or focal neurologic deficits are appreciated. Skin:  Skin is warm, dry and intact. No rash noted. Psychiatric: Mood and affect are normal. Speech and behavior are normal. Patient exhibits appropriate insight and judgment.  ____________________________________________   EKG I, Governor Rooks, MD, the attending physician have personally viewed and interpreted all ECGs.  No EKG performed ____________________________________________  LABS (pertinent  positives/negatives)  Urinalysis no significant abnormality Sodium 131, potassium 2.9, chloride 86 White blood cell count 12.0, hemoglobin 11.2  ____________________________________________  RADIOLOGY All Xrays were viewed by me. Imaging interpreted by Radiologist.  Thoracic and lumbar x-rays: Mild T12 compression deformity of uncertain age.  3.5 cm abdominal aortic aneurysm.  CT abdomen and pelvis:  IMPRESSION: 1. Acute fracture involving the superior endplate of T12, with slight displacement and very minimal loss of height. No evidence of retropulsion or extension to the posterior elements at this time. 2. Mild aneurysmal dilatation of the infrarenal abdominal aorta to 3.3 cm in maximal AP and transverse dimensions. This resolves proximal to the aortic bifurcation. Scattered calcification along the abdominal aorta and its branches. Recommend followup by ultrasound in 3 years. This recommendation follows ACR consensus guidelines: White Paper of the ACR Incidental Findings Committee II on Vascular Findings. J Am Coll Radiol 2013; 61:950-932 3. Small right pleural effusion, with associated airspace opacification, likely reflecting atelectasis. 4. Cholelithiasis; gallbladder otherwise unremarkable. 5. Small left renal cyst seen. 6. Enlarged prostate noted. __________________________________________  PROCEDURES  Procedure(s) performed: None Critical Care performed: None  ____________________________________________   ED COURSE / ASSESSMENT AND PLAN  CONSULTATIONS: None  Pertinent labs & imaging results that were available during my care of the patient were reviewed by me and considered in my medical decision making (see chart for details).   Patient's clinical scenario was worrisome for possible back fracture. CT was ordered to further elucidate fracture and the finding of calcified aortic aneurysm. CT confirmed acute thoracic fracture, and we discussed pain control  with the patient and his wife.  Patient was given potassium replacement for mildly low potassium.  Patient / Family / Caregiver informed of clinical course, medical decision-making process, and agree with plan.   I discussed return precautions, follow-up instructions, and discharged instructions with patient and/or family.  ___________________________________________   FINAL CLINICAL IMPRESSION(S) / ED DIAGNOSES   Final diagnoses:  T12 vertebral fracture, closed,  initial encounter  Aortic aneurysm    FOLLOW UP  Referred to: Your primary care physician, one week   Governor Rooks, MD 11/11/14 2148

## 2014-11-11 NOTE — ED Notes (Signed)
Pt back from CT

## 2014-11-11 NOTE — ED Notes (Signed)
Pt to CT at this time.

## 2014-11-11 NOTE — ED Notes (Addendum)
Pt to ED via EMS after falling back against coffee table on Tuesday. Pt states pain cannot be controlled but rates at 3/10 currently. Pt states ambulates fine, vitals wnl, MD Lord at bedside on arrival. Bilateral lower extremity strength equal and strong. Pt currently hospice pt for CHF.

## 2014-11-11 NOTE — ED Notes (Signed)
Family at bedside. 

## 2014-11-11 NOTE — ED Notes (Signed)
Hospice RN at bedside at this time.

## 2014-11-11 NOTE — Discharge Instructions (Signed)
You were evaluated for back pain and found to have a fracture on the thoracic T12 vertebra. Your scan also showed you have a mild aortic aneurysm. You need to have a repeat imaging with ultrasound in about 3 years.   Return to the emergency department for any worsening condition including worsening pain, weakness, numbness, or any other condition concerning to you. Vertebral Fracture You have a fracture of one or more vertebra. These are the bony parts that form the spine. Minor vertebral fractures happen when people fall. Osteoporosis is associated with many of these fractures. Hospital care may not be necessary for minor compression fractures that are stable. However, multiple fractures of the spine or unstable injuries can cause severe pain and even damage the spinal cord. A spinal cord injury may cause paralysis, numbness, or loss of normal bowel and bladder control.  Normally there is pain and stiffness in the back for 3 to 6 weeks after a vertebral fracture. Bed rest for several days, pain medicine, and a slow return to activity is often the only treatment that is needed depending on the location of the fracture. Neck and back braces may be helpful in reducing pain and increasing mobility. When your pain allows, you should begin walking or swimming to help maintain your endurance. Exercises to improve motion and to strengthen the back may also be useful after the initial pain improves. Treatment for osteoporosis may be essential for full recovery. This will help reduce your risk of vertebral fractures with a future fall. During the first few days after a spine fracture you may feel nauseated or vomit. If this is severe, hospital care with IV fluids will be needed.  Arrange for follow-up care as recommended to assure proper long-term care and prevention of further spine injury.  SEEK IMMEDIATE MEDICAL CARE IF:  You have increasing pain, vomiting, or are unable to move around at all.  You develop  numbness, tingling, weakness, or paralysis of any part of your body.  You develop a loss of normal bowel or bladder control.  You have difficulty breathing, cough, fever, chest or abdominal pain. MAKE SURE YOU:   Understand these instructions.  Will watch your condition.  Will get help right away if you are not doing well or get worse. Document Released: 05/06/2004 Document Revised: 06/21/2011 Document Reviewed: 11/19/2008 [facility] Patient Information 2015 [place], [place]. This information is not intended to replace advice given to you by your health care provider. Make sure you discuss any questions you have with your health care provider.  Abdominal Aortic Aneurysm An aneurysm is a weakened or damaged part of an artery wall that bulges from the normal force of blood pumping through the body. An abdominal aortic aneurysm is an aneurysm that occurs in the lower part of the aorta, the main artery of the body.  The major concern with an abdominal aortic aneurysm is that it can enlarge and burst (rupture) or blood can flow between the layers of the wall of the aorta through a tear (aorticdissection). Both of these conditions can cause bleeding inside the body and can be life threatening unless diagnosed and treated promptly. CAUSES  The exact cause of an abdominal aortic aneurysm is unknown. Some contributing factors are:   A hardening of the arteries caused by the buildup of fat and other substances in the lining of a blood vessel (arteriosclerosis).  Inflammation of the walls of an artery (arteritis).   Connective tissue diseases, such as Marfan syndrome.   Abdominal  trauma.   An infection, such as syphilis or staphylococcus, in the wall of the aorta (infectious aortitis) caused by bacteria. RISK FACTORS  Risk factors that contribute to an abdominal aortic aneurysm may include:  Age older than 60 years.   High blood pressure (hypertension).  Male gender.  Ethnicity (white  race).  Obesity.  Family history of aneurysm (first degree relatives only).  Tobacco use. PREVENTION  The following healthy lifestyle habits may help decrease your risk of abdominal aortic aneurysm:  Quitting smoking. Smoking can raise your blood pressure and cause arteriosclerosis.  Limiting or avoiding alcohol.  Keeping your blood pressure, blood sugar level, and cholesterol levels within normal limits.  Decreasing your salt intake. In somepeople, too much salt can raise blood pressure and increase your risk of abdominal aortic aneurysm.  Eating a diet low in saturated fats and cholesterol.  Increasing your fiber intake by including whole grains, vegetables, and fruits in your diet. Eating these foods may help lower blood pressure.  Maintaining a healthy weight.  Staying physically active and exercising regularly. SYMPTOMS  The symptoms of abdominal aortic aneurysm may vary depending on the size and rate of growth of the aneurysm.Most grow slowly and do not have any symptoms. When symptoms do occur, they may include:  Pain (abdomen, side, lower back, or groin). The pain may vary in intensity. A sudden onset of severe pain may indicate that the aneurysm has ruptured.  Feeling full after eating only small amounts of food.  Nausea or vomiting or both.  Feeling a pulsating lump in the abdomen.  Feeling faint or passing out. DIAGNOSIS  Since most unruptured abdominal aortic aneurysms have no symptoms, they are often discovered during diagnostic exams for other conditions. An aneurysm may be found during the following procedures:  Ultrasonography (A one-time screening for abdominal aortic aneurysm by ultrasonography is also recommended for all men aged 65-75 years who have ever smoked).  X-ray exams.  A computed tomography (CT).  Magnetic resonance imaging (MRI).  Angiography or arteriography. TREATMENT  Treatment of an abdominal aortic aneurysm depends on the size of  your aneurysm, your age, and risk factors for rupture. Medication to control blood pressure and pain may be used to manage aneurysms smaller than 6 cm. Regular monitoring for enlargement may be recommended by your caregiver if:  The aneurysm is 3-4 cm in size (an annual ultrasonography may be recommended).  The aneurysm is 4-4.5 cm in size (an ultrasonography every 6 months may be recommended).  The aneurysm is larger than 4.5 cm in size (your caregiver may ask that you be examined by a vascular surgeon). If your aneurysm is larger than 6 cm, surgical repair may be recommended. There are two main methods for repair of an aneurysm:   Endovascular repair (a minimally invasive surgery). This is done most often.  Open repair. This method is used if an endovascular repair is not possible. Document Released: 01/06/2005 Document Revised: 07/24/2012 Document Reviewed: 04/28/2012 [facility] Patient Information 2015 [place], [place]. This information is not intended to replace advice given to you by your health care provider. Make sure you discuss any questions you have with your health care provider.

## 2014-11-11 NOTE — ED Notes (Signed)
MD Lord at bedside. 

## 2014-11-11 NOTE — Progress Notes (Signed)
ED visit made. Mr. Thormahlen is followed by [facility] at home, where he lives with his wife Carney Bern. Hospice dx: combined systolic and diastolic heart failure, he is a DNR. Patient to the ED today via EMS for evaluation of back pain following a fall last week, where he hit a coffee table "falling backwards". He reports he is "ok until I move". He was unable to get out of bed yesterday or today. Patient's wife Carney Bern in during visit, she reports she has given ibuprofen and liquid morphine with no improvement in the pain. Emotional support offered. Attending physician Dr. Shaune Pollack and staff RN Junie Panning made aware that Mr. Crase is followed by [facility]. Patient remains in the ED awaiting an xray. Will continue to follow and update hospice team. Dayna Barker RN, BSN, [facility] [place], [facility] [phone] c

## 2014-11-15 ENCOUNTER — Telehealth: Payer: Self-pay | Admitting: *Deleted

## 2014-11-15 NOTE — Telephone Encounter (Signed)
Physician's interim order report for Atorvastatin 40 mg 1 tablet daily was faxed back to [facility].

## 2014-11-15 NOTE — Telephone Encounter (Signed)
Physician care update/physician orders was faxed back to [facility] Patient is to continue Plavix 75 mg daily. Wife notified 10/31/14. She states he hasn't missed any does.

## 2014-11-25 ENCOUNTER — Ambulatory Visit (INDEPENDENT_AMBULATORY_CARE_PROVIDER_SITE_OTHER): Admitting: *Deleted

## 2014-11-25 DIAGNOSIS — I5022 Chronic systolic (congestive) heart failure: Secondary | ICD-10-CM | POA: Diagnosis not present

## 2014-11-25 DIAGNOSIS — Z95810 Presence of automatic (implantable) cardiac defibrillator: Secondary | ICD-10-CM

## 2014-11-27 NOTE — Progress Notes (Signed)
EPIC Encounter for ICM Monitoring  Patient Name: [name] is a 74 y.o. male Date: 11/27/2014 Primary Care Physican: Marcine Matar, MD Primary Cardiologist: Cro[place]u Electrophysiologist: Croitoru Dry Weight: Unknown, patient unable to weigh due to leg weakness from broken back per wife.       Bi-V Pacing 75%  In the past month, have you:  1. Gained more than 2 pounds in a day or more than 5 pounds in a week? Unknown  2. Had changes in your medications (with verification of current medications)? no  3. Had more shortness of breath than is usual for you? no  4. Limited your activity because of shortness of breath? no  5. Not been able to sleep because of shortness of breath? no  6. Had increased swelling in your feet or ankles? no  7. Had symptoms of dehydration (dizziness, dry mouth, increased thirst, decreased urine output) no  8. Had changes in sodium restriction? no  9. Been compliant with medication? Yes   ICM trend:   Follow-up plan: ICM clinic phone appointment pending.  Spoke with wife and she reported patient fell 3-4 weeks ago and broke his back.  He is having difficulty standing and in a hospital bed most of the time.  He has poor appetite and fluid intake since the fall.  CorVue readings shows impedance elevation from baseline and education given that patient may be dehydrated due to lack of fluid intake.  Education given to attempt to have patient increase fluid intake.  Patient remains under of [facility].  Copy of note sent to patient's primary care physician, primary cardiologist, and device following physician.  Karie Soda, RN, CCM 11/27/2014 3:19 PM

## 2014-11-28 NOTE — Progress Notes (Signed)
After speaking with patients wife, message sent to Dr Royann Shivers regarding if patient should continue in [facility] program.  Received message from Dr Royann Shivers and he is agreeable to discontinuing ICM calls permanently.

## 2014-12-02 ENCOUNTER — Other Ambulatory Visit: Payer: Self-pay | Admitting: Cardiovascular Disease

## 2014-12-02 NOTE — Telephone Encounter (Signed)
KEEP OV. 

## 2014-12-19 ENCOUNTER — Encounter: Payer: Medicare HMO | Admitting: Cardiovascular Disease

## 2015-01-05 ENCOUNTER — Other Ambulatory Visit: Payer: Self-pay | Admitting: Cardiology

## 2015-01-06 NOTE — Telephone Encounter (Signed)
Rx request sent to pharmacy.  

## 2015-01-16 ENCOUNTER — Other Ambulatory Visit: Payer: Self-pay | Admitting: Cardiovascular Disease

## 2015-01-17 NOTE — Telephone Encounter (Signed)
Rx request sent to pharmacy.  

## 2015-02-04 ENCOUNTER — Other Ambulatory Visit: Payer: Self-pay | Admitting: Cardiovascular Disease

## 2015-03-02 ENCOUNTER — Other Ambulatory Visit: Payer: Self-pay | Admitting: Cardiovascular Disease

## 2015-03-12 ENCOUNTER — Other Ambulatory Visit: Payer: Self-pay | Admitting: Cardiovascular Disease

## 2015-03-12 NOTE — Telephone Encounter (Signed)
REFILL 

## 2015-04-10 ENCOUNTER — Other Ambulatory Visit: Payer: Self-pay | Admitting: Cardiovascular Disease

## 2015-04-10 NOTE — Telephone Encounter (Signed)
Rx request sent to pharmacy.  

## 2015-04-28 ENCOUNTER — Other Ambulatory Visit: Payer: Self-pay | Admitting: Cardiovascular Disease

## 2015-04-28 NOTE — Telephone Encounter (Signed)
Rx request sent to pharmacy.  

## 2015-07-15 ENCOUNTER — Other Ambulatory Visit (HOSPITAL_COMMUNITY): Payer: Self-pay | Admitting: Internal Medicine

## 2015-07-25 ENCOUNTER — Telehealth: Payer: Self-pay

## 2015-07-25 NOTE — Telephone Encounter (Signed)
Faxed signed order for Atorvastatin to Hospice.

## 2015-09-08 ENCOUNTER — Telehealth: Payer: Self-pay

## 2015-09-08 NOTE — Telephone Encounter (Signed)
Signed physician's orders for atorvastatin faxed to [facility] on 08/28/2015

## 2015-09-19 ENCOUNTER — Telehealth: Payer: Self-pay

## 2015-09-19 NOTE — Telephone Encounter (Signed)
Received labs and urinalysis, done 09/02/15, from [facility]. Urinalysis was abnormal.  Called to Hospice triage RN to insure that patient was treated. Per Olegario Messier, triage RN, patient was treated by medical director, Abner Greenspan, on 09/07/15.

## 2015-09-26 ENCOUNTER — Telehealth: Payer: Self-pay | Admitting: Cardiology

## 2015-09-26 NOTE — Telephone Encounter (Signed)
LMOVM requesting that pt send manual transmission b/c home monitor has not updated in at least 8 days.   

## 2015-10-06 ENCOUNTER — Telehealth: Payer: Self-pay | Admitting: Cardiology

## 2015-10-06 NOTE — Telephone Encounter (Signed)
Spoke w/ pt daughter and informed her that pt needed to send a manual transmission w/ his home monitor. Pt daughter verbalized understanding.

## 2015-10-17 ENCOUNTER — Telehealth: Payer: Self-pay | Admitting: Cardiology

## 2015-10-17 NOTE — Telephone Encounter (Signed)
LMOVM requesting that pt send manual transmission b/c home monitor has not updated in at least 8 days.   

## 2015-10-31 ENCOUNTER — Telehealth: Payer: Self-pay | Admitting: Cardiology

## 2015-10-31 NOTE — Telephone Encounter (Signed)
LMOVM requesting that pt send manual transmission b/c home monitor has not updated in at least 8 days.   

## 2015-11-11 DEATH — deceased

## 2015-11-14 ENCOUNTER — Telehealth: Payer: Self-pay | Admitting: Cardiology

## 2015-11-14 NOTE — Telephone Encounter (Signed)
Pt family informed me that pt passed away on 2015-10-25.
